# Patient Record
Sex: Male | Born: 1950 | Race: Black or African American | Hispanic: No | Marital: Married | Smoking: Never smoker
Health system: Southern US, Community
[De-identification: ages and names within clinical notes are randomized; demographics above are authoritative.]

## PROBLEM LIST (undated history)

## (undated) DIAGNOSIS — M1990 Unspecified osteoarthritis, unspecified site: Secondary | ICD-10-CM

## (undated) DIAGNOSIS — I10 Essential (primary) hypertension: Secondary | ICD-10-CM

## (undated) DIAGNOSIS — Z789 Other specified health status: Secondary | ICD-10-CM

## (undated) DIAGNOSIS — G4730 Sleep apnea, unspecified: Secondary | ICD-10-CM

## (undated) HISTORY — PX: HERNIA REPAIR: SHX51

## (undated) HISTORY — PX: ROTATOR CUFF REPAIR: SHX139

## (undated) HISTORY — PX: KNEE ARTHROSCOPY W/ ACL RECONSTRUCTION: SHX1858

## (undated) HISTORY — PX: OTHER SURGICAL HISTORY: SHX169

---

## 2003-11-18 ENCOUNTER — Emergency Department (HOSPITAL_COMMUNITY): Admission: EM | Admit: 2003-11-18 | Discharge: 2003-11-18 | Payer: Self-pay | Admitting: Family Medicine

## 2004-11-24 ENCOUNTER — Emergency Department (HOSPITAL_COMMUNITY): Admission: EM | Admit: 2004-11-24 | Discharge: 2004-11-24 | Payer: Self-pay | Admitting: Emergency Medicine

## 2004-12-01 ENCOUNTER — Emergency Department (HOSPITAL_COMMUNITY): Admission: EM | Admit: 2004-12-01 | Discharge: 2004-12-01 | Payer: Self-pay | Admitting: Family Medicine

## 2005-04-01 ENCOUNTER — Emergency Department (HOSPITAL_COMMUNITY): Admission: EM | Admit: 2005-04-01 | Discharge: 2005-04-01 | Payer: Self-pay | Admitting: Family Medicine

## 2005-04-01 ENCOUNTER — Ambulatory Visit (HOSPITAL_COMMUNITY): Admission: RE | Admit: 2005-04-01 | Discharge: 2005-04-01 | Payer: Self-pay | Admitting: Family Medicine

## 2005-07-16 ENCOUNTER — Ambulatory Visit (HOSPITAL_COMMUNITY): Admission: RE | Admit: 2005-07-16 | Discharge: 2005-07-17 | Payer: Self-pay | Admitting: Orthopedic Surgery

## 2006-08-26 ENCOUNTER — Emergency Department (HOSPITAL_COMMUNITY): Admission: EM | Admit: 2006-08-26 | Discharge: 2006-08-26 | Payer: Self-pay | Admitting: Family Medicine

## 2008-04-19 ENCOUNTER — Emergency Department (HOSPITAL_COMMUNITY): Admission: EM | Admit: 2008-04-19 | Discharge: 2008-04-19 | Payer: Self-pay | Admitting: Emergency Medicine

## 2008-08-29 ENCOUNTER — Emergency Department (HOSPITAL_COMMUNITY): Admission: EM | Admit: 2008-08-29 | Discharge: 2008-08-29 | Payer: Self-pay | Admitting: Emergency Medicine

## 2008-12-16 ENCOUNTER — Emergency Department (HOSPITAL_COMMUNITY): Admission: EM | Admit: 2008-12-16 | Discharge: 2008-12-16 | Payer: Self-pay | Admitting: Family Medicine

## 2010-04-20 ENCOUNTER — Emergency Department (HOSPITAL_COMMUNITY): Admission: EM | Admit: 2010-04-20 | Discharge: 2010-04-20 | Payer: Self-pay | Admitting: Emergency Medicine

## 2010-09-04 LAB — URINALYSIS, ROUTINE W REFLEX MICROSCOPIC
Bilirubin Urine: NEGATIVE
Glucose, UA: NEGATIVE mg/dL
Hgb urine dipstick: NEGATIVE
Ketones, ur: NEGATIVE mg/dL
Nitrite: NEGATIVE
Protein, ur: NEGATIVE mg/dL
Specific Gravity, Urine: 1.023 (ref 1.005–1.030)
Urobilinogen, UA: 1 mg/dL (ref 0.0–1.0)
pH: 6 (ref 5.0–8.0)

## 2010-09-04 LAB — DIFFERENTIAL
Basophils Absolute: 0.1 10*3/uL (ref 0.0–0.1)
Basophils Relative: 1 % (ref 0–1)
Eosinophils Absolute: 0.1 10*3/uL (ref 0.0–0.7)
Eosinophils Relative: 2 % (ref 0–5)
Lymphocytes Relative: 18 % (ref 12–46)
Lymphs Abs: 1 10*3/uL (ref 0.7–4.0)
Monocytes Absolute: 0.4 10*3/uL (ref 0.1–1.0)
Monocytes Relative: 7 % (ref 3–12)
Neutro Abs: 3.9 10*3/uL (ref 1.7–7.7)
Neutrophils Relative %: 72 % (ref 43–77)

## 2010-09-04 LAB — CBC
HCT: 38.9 % — ABNORMAL LOW (ref 39.0–52.0)
Hemoglobin: 13.6 g/dL (ref 13.0–17.0)
MCHC: 34.9 g/dL (ref 30.0–36.0)
MCV: 91.7 fL (ref 78.0–100.0)
Platelets: 196 10*3/uL (ref 150–400)
RBC: 4.24 MIL/uL (ref 4.22–5.81)
RDW: 12 % (ref 11.5–15.5)
WBC: 5.4 10*3/uL (ref 4.0–10.5)

## 2010-09-04 LAB — POCT I-STAT, CHEM 8
BUN: 11 mg/dL (ref 6–23)
Calcium, Ion: 1.05 mmol/L — ABNORMAL LOW (ref 1.12–1.32)
Chloride: 107 mEq/L (ref 96–112)
Creatinine, Ser: 1.3 mg/dL (ref 0.4–1.5)
Glucose, Bld: 87 mg/dL (ref 70–99)
HCT: 40 % (ref 39.0–52.0)
Hemoglobin: 13.6 g/dL (ref 13.0–17.0)
Potassium: 3.6 mEq/L (ref 3.5–5.1)
Sodium: 140 mEq/L (ref 135–145)
TCO2: 23 mmol/L (ref 0–100)

## 2010-10-08 NOTE — Op Note (Signed)
NAME:  Chad Guerra, Chad Guerra NO.:  [id number]   MEDICAL RECORD NO.:  [id number]          PATIENT TYPE:  EMS   LOCATION:  URG                          FACILITY:  [facility]   PHYSICIAN:  Dionne Ano. Gramig, M.D.DATE OF BIRTH:  [birth year]   DATE OF PROCEDURE:  DATE OF DISCHARGE:  12/16/2008                               OPERATIVE REPORT   I had the pleasure of seeing Chad Guerra today from Dr. Artis Flock at  the [facility].  The patient sustained  injury to the right index finger, crushing in nature, resulting in an  open phalanx fracture with associated nail plate and nail bed injury.  He presents with an open fracture, pain and is here with his wife.   PAST MEDICAL HISTORY:  Reviewed.   PAST SURGICAL HISTORY:  Shoulder and knee surgery.   CURRENT MEDICINE:  Aspirin p.r.n.   ALLERGIES:  The patient states that he has had difficulty with taking  PERCOCET in the past.   SOCIAL HISTORY:  He does not smoke.  He lives with his wife and many  children.   PHYSICAL EXAMINATION:  GENERAL:  He is alert and oriented.  VITAL SIGNS:  Stable.  EXTREMITIES:  The patient has open fracture about the index finger,  right hand.  PIP and MCP joints are stable, there is no evidence of  infection or dystrophy.  There is no evidence of vascular compromise.  CHEST:  He has a normal chest exam.  ABDOMEN:  Nontender.  HEART:  Regular rate.  HEENT:  __________.   At that time, x-rays show displaced fracture about the distal phalanx.  I have reviewed this at length and his findings.   IMPRESSION:  Open fracture distal phalanx right index finger.   PLAN:  He was consented and underwent intermetacarpal block and then was  treated with I&D of skin, subcutaneous tissue and bone with was an  excisional debridement of bone pulp tissue, nail bed and nail plate.   I then performed nail plate removal..  Following this, I then set the  fracture and performed open  treatment of the fracture.   Following this, I performed a nail bed repair with 5-0 chromic suture  under 4.0 loupe magnification.  I then placed Adaptic under the  eponychial fold and discussed with the patient the postop protocol.   He was dressed sterilely with Adaptic followed by Xeroform, sterile  gauze wrap and all precautions were discussed.   Following this, I then performed a thorough discussion including  elevation, __________ 7 days and therapy, 12 days myself.  Keflex was  written for postop antibiotic measures one p.o. q.i.d. 500 mg tablet.  The pain medicine prescribed was Vicodin 1-2 q.4-6 hours p.r.n. pain  p.o.  I discussed with the patient the relevant do's and don'ts, etc.   FINAL PROCEDURAL DIAGNOSES PERFORMED:  1. Status post open reduction internal fixation, distal phalanx      fracture.  2. Irrigation and dbridement, open distal phalanx fracture right      index finger, excisional debridement in nature.  3. Nail plate removal.  4. Nailbed repair.      Dionne Ano. Amanda Pea, M.D.  Electronically Signed     WMG/MEDQ  D:  12/16/2008  T:  12/17/2008  Job:  [id number]

## 2010-10-11 NOTE — Op Note (Signed)
NAME:  PERCIVAL, GLASHEEN NO.:  [id number]   MEDICAL RECORD NO.:  [id number]          PATIENT TYPE:  OIB   LOCATION:  1008                         FACILITY:  [facility]   PHYSICIAN:  Georges Lynch. Gioffre, M.D.DATE OF BIRTH:  Jul 05, 1950   DATE OF PROCEDURE:  07/16/2005  DATE OF DISCHARGE:  07/17/2005                                 OPERATIVE REPORT   SURGEON:  Windy Fast A. Darrelyn Hillock, M.D.   ASSISTANT:  Jamelle Rushing, P.A.   PREOP DIAGNOSIS:  1.  Complete tear of the rotator cuff tendon, right shoulder secondary to      severe impingement syndrome.  2.  Previous resection of his distal clavicle on the right side.   POSTOP DIAGNOSIS:  1.  Complete tear of the rotator cuff tendon, right shoulder secondary to      severe impingement syndrome.  2.  Previous resection of his distal clavicle.   OPERATIONS:  1.  Open decompression of the right shoulder by performing any      acromionectomy and acromioplasty.  2.  Repair of a complete tear of the rotator cuff tendon, right shoulder,      with reinforcement of a RESTORE tendon graft with three 4-pronged Mitek      sutures.   PROCEDURE:  Prior to the general anesthesia, the patient had an interscalene  nerve block. He was taken back to surgery and under general anesthesia, he  had a routine prep and draping of the right upper extremity, the right  shoulder. He had 1 gram of IV Ancef preop. An incision was made over the  anterior aspect of the right shoulder. Bleeders were identified and  cauterized. I then went down and detached the deltoid tendon in a  longitudinal fashion from the acromion and reflected the tendon laterally  and medially; and then split the proximal part of the deltoid muscle. I then  went down and identified the bursa; excised the subdeltoid bursa which was  chronically inflamed. Immediately upon noting this, he had a large hole in  his rotator cuff tendon. We noted that when we brought the shoulder up into  abduction the bone end of the acromion, literally, was imbedding into the  hole that was there. So we protected the remaining part of the cuff with a  Bennett retractor. We utilized the oscillating saw and the bur to perform a  partial acromionectomy and an acromioplasty. Once this was done and we had  good clearance; we irrigated the area out and then bone waxed the  undersurface of the acromion for hemostasis purposes.   We then addressed the rotator cuff, we incised the necrotic part of the  cuff. We then utilized the bur to bur down the lateral articular surface of  the humerus and then 2 Mitek sutures were inserted; the tendon was brought  forward and the tendon was sutured to the bone. Following that, we then  utilized a RESTORE tendon graft to graft the remaining part of the tendon.  The defect, now, was nicely closed. Following that, we then thoroughly  irrigated out the area,  inserted some thrombin-soaked Gelfoam;  and then reapproximated the deltoid  tendon to the muscle in the usual fashion. The subcu was closed with #0  Vicryl. The skin was closed with metal staples; and a sterile Neosporin  dressing was applied; and he was placed in a shoulder immobilizer.           ______________________________  Georges Lynch Darrelyn Hillock, M.D.     RAG/MEDQ  D:  07/16/2005  T:  07/17/2005  Job:  [id number]

## 2010-10-11 NOTE — H&P (Signed)
NAME:  TACOMA, MERIDA NO.:  [id number]   MEDICAL RECORD NO.:  [id number]          PATIENT TYPE:  OUT   LOCATION:  CATS                         FACILITY:  [facility]   PHYSICIAN:  Adolph Pollack, M.D.DATE OF BIRTH:  Dec 17, 1950   DATE OF ADMISSION:  04/01/2005  DATE OF DISCHARGE:                                HISTORY & PHYSICAL   CHIEF COMPLAINT:  Worsening left lower quadrant pain.   HISTORY OF PRESENT ILLNESS:  This is a 60 year old male who came in with  some crampy lower abdominal pain late last week. He had abruptly got worse  today and is localized to the left lower quadrant. He had some nausea and  chills with it. He had been eating a lot of sunflower seeds recently. Has  not had anything like this pain. He noticed a little bit of bloody drainage  out of his penis late last week as well. He has had some loose bowel  movements recently. He was seen up in Urgent Care and sent for a CT scan in  which acute diverticulitis was noted in the sigmoid region and a few  punctate bubbles of air were noted in the area, but no abscess or excessive  amount of free air. He subsequently was sent down to the emergency  department and I was then asked to see him. The only laboratory that was  done at Urgent Care was a urinalysis.   PAST MEDICAL HISTORY:  Dislocation of right shoulder.   PREVIOUS OPERATIONS:  Right shoulder surgery.   ALLERGIES:  PERCOCET.   MEDICATIONS:  Vicodin and a muscle relaxant.   SOCIAL HISTORY:  He is married and has 3 children. Denies tobacco or alcohol  use. He has recently located here from [place] and does not have a primary  care physician.   REVIEW OF SYSTEMS:  CARDIOVASCULAR:  No heart disease, no hypertension.  PULMONARY:  No pneumonia, asthma, tuberculosis. GI: No peptic ulcer disease  or colitis. GU: No hematuria. Does state he has kidney stones about 18 years  ago. ENDOCRINE:  No diabetes. NEUROLOGIC:  No strokes or seizures.  HEMATOLOGIC:  No bleeding disorders or transfusions. MUSCULOSKELETAL:  He  has a recent right shoulder injury and is being treated by Dr. Ranee Gosselin of [facility]. This is why he is taking the Vicodin and  muscle relaxant. He is also undergoing physical therapy for this.   PHYSICAL EXAMINATION:  GENERAL:  A well-developed, well-nourished male in no  acute distress. He is lying in the hallway in the emergency department.  VITAL SIGNS:  Temperature on arrival to the ER was 99.2, blood pressure  134/82, pulse 93.  HEENT:  Extraocular movements are intact, no icterus.  NECK:  Supple without masses, thyroid not enlarged.  RESPIRATORY:  Breath sounds are equal and clear, breathing is unlabored.  CARDIOVASCULAR:  Demonstrates a regular rate and rhythm, no murmur heard.  ABDOMEN:  Soft. There is an umbilical hernia present. There is left lower  quadrant tenderness, no guarding to palpation and percussion. No diffuse  peritoneal signs present.  EXTREMITIES:  Good muscle tone  for range of motion. He is a little bit  limited around his right shoulder area.   Urinalysis is negative. CBC pending. CT demonstrates inflammatory changes  around the sigmoid colon, small air bubbles adjacent to the colon. There is  no abscess.   IMPRESSION:  Acute sigmoid diverticulitis without abscess.   I had a long discussion with him about ideal treatment including basically  being admitted to the hospital with intravenous antibiotics and a liquid  diet. He however is hesitant to come into the hospital and would like to be  sent home.   PLAN:  I will give him a dose of IV Zosyn here in the emergency department.  Since he does not want to be admitted, I will go ahead and discharge him on  high dose Cipro with Flagyl for 2 weeks. I explained to him the danger of  potentially not being admitted, and pre-perforation and being very well. I  told him if he is not better in 48 hours or gets worse he  needs to come  straight to the emergency department. He needs to be on a liquid diet for 4  days. He needs to be on very limited activity. If he does get better in 48  hours or continues to improve, I will see him in the office in 2 weeks.      Adolph Pollack, M.D.  Electronically Signed     TJR/MEDQ  D:  04/01/2005  T:  04/01/2005  Job:  [id number]

## 2011-07-22 ENCOUNTER — Encounter (HOSPITAL_COMMUNITY): Payer: Self-pay | Admitting: Emergency Medicine

## 2011-07-22 ENCOUNTER — Emergency Department (HOSPITAL_COMMUNITY)
Admission: EM | Admit: 2011-07-22 | Discharge: 2011-07-22 | Disposition: A | Discharge status: Home / Self Care | Payer: BC Managed Care – PPO | Attending: Emergency Medicine | Admitting: Emergency Medicine

## 2011-07-22 ENCOUNTER — Emergency Department (HOSPITAL_COMMUNITY): Payer: BC Managed Care – PPO

## 2011-07-22 DIAGNOSIS — X500XXA Overexertion from strenuous movement or load, initial encounter: Secondary | ICD-10-CM | POA: Insufficient documentation

## 2011-07-22 DIAGNOSIS — S46909A Unspecified injury of unspecified muscle, fascia and tendon at shoulder and upper arm level, unspecified arm, initial encounter: Secondary | ICD-10-CM | POA: Insufficient documentation

## 2011-07-22 DIAGNOSIS — M25519 Pain in unspecified shoulder: Secondary | ICD-10-CM | POA: Insufficient documentation

## 2011-07-22 DIAGNOSIS — S4990XA Unspecified injury of shoulder and upper arm, unspecified arm, initial encounter: Secondary | ICD-10-CM

## 2011-07-22 DIAGNOSIS — S4980XA Other specified injuries of shoulder and upper arm, unspecified arm, initial encounter: Secondary | ICD-10-CM | POA: Insufficient documentation

## 2011-07-22 DIAGNOSIS — R209 Unspecified disturbances of skin sensation: Secondary | ICD-10-CM | POA: Insufficient documentation

## 2011-07-22 DIAGNOSIS — M25529 Pain in unspecified elbow: Secondary | ICD-10-CM | POA: Insufficient documentation

## 2011-07-22 MED ORDER — IBUPROFEN 800 MG PO TABS
800.0000 mg | ORAL_TABLET | Freq: Once | ORAL | Status: AC
  Administered 2011-07-22: 800 mg via ORAL
  Filled 2011-07-22: qty 1

## 2011-07-22 MED ORDER — IBUPROFEN 800 MG PO TABS: 800.0000 mg | ORAL_TABLET | Freq: Three times a day (TID) | ORAL | Status: AC | PRN

## 2011-07-22 MED ORDER — OXYCODONE-ACETAMINOPHEN 5-325 MG PO TABS
1.0000 | ORAL_TABLET | Freq: Once | ORAL | Status: DC
  Filled 2011-07-22: qty 1

## 2011-07-22 NOTE — Discharge Instructions (Signed)
Be sure to read and understand instructions below prior to leaving the hospital. If your symptoms persist without any improvement in 1 week it is reccommended that you follow up with orthopedics listed above. Use your pain medication as prescribed and do not operate heavy machinery while on pain medication. ° °Common mechanisms of injury include:   °Direct hit (trauma) to the shoulder.  °Aging, erosion of the tendon with normal use.  °Bony bump on shoulder (acromial spur).  °Stress from sudden increase in duration, frequency, or intensity of training ° °RISK INCREASES WITH:  °Contact sports (football, wrestling, boxing).  °Throwing sports (baseball, tennis, volleyball).  °Weightlifting and bodybuilding.  °Heavy labor.  °Previous injury to the rotator cuff, including impingement.  °Poor shoulder strength and flexibility.  °Failure to warm up properly before activity.  °Inadequate protective equipment.  °Old age.  °Bony bump on shoulder (acromial spur).  ° °RELATED COMPLICATIONS  °Shoulder stiffness, frozen shoulder, or loss of motion.  °Rotator cuff tendon tear.  °Recurring symptoms, especially if activity is resumed too soon, with overuse, with a direct blow, or when using poor technique.  ° °TREATMENT  °Treatment first involves the use of ice and medicine, to reduce pain and inflammation. The use of strengthening and stretching exercises may help reduce pain with activity. These exercises may be performed at home or with a therapist.  °If non-surgical treatment is unsuccessful after more than 6 months, surgery may be advised. °MEDICATION  °If pain medicine is needed, nonsteroidal anti-inflammatory medicines (Motrin and ibuprofen), or other minor pain relievers (acetaminophen) °Prescription pain relievers may be given, if your caregiver thinks they are needed. Use only as directed and only as much as you need  °ACTIVITY °      - It is reccommended to perform range of motion activity as tolerated to prevent shoulder  stiffness.  °HEAT AND COLD  °Cold treatment (icing) should be applied for 10 to 15 minutes every 2 to 3 hours for inflammation and pain, and immediately after activity that aggravates your symptoms. Use ice packs or an ice massage.  °Heat treatment may be used before performing stretching and strengthening activities prescribed by your caregiver, physical therapist, or athletic trainer. Use a heat pack or a warm water soak.  °SEEK IMMEDIATE MEDICAL CARE IF:  °Your arm, hand, or fingers are numb or tingling.  °Your arm, hand, or fingers are swollen, painful, or turn white or blue.  °You develop chest pain or shortness of breath.  °

## 2011-07-22 NOTE — ED Provider Notes (Signed)
Medical screening examination/treatment/procedure(s) were performed by non-physician practitioner and as supervising physician I was immediately available for consultation/collaboration.   Joya Gaskins, MD 07/22/11 [phone]

## 2011-07-22 NOTE — ED Notes (Signed)
Pt alert, nad, c/o right shoulder pain, onset a few days ago, presents to ED wearing sling from home, PMS intact, resp even unlabored, skin pwd

## 2011-07-22 NOTE — ED Provider Notes (Signed)
History     CSN: [id number]  Arrival date & time 07/22/11  2595   First MD Initiated Contact with Patient 07/22/11 2126      Chief Complaint  Patient presents with  . Shoulder Pain    (Consider location/radiation/quality/duration/timing/severity/associated sxs/prior treatment) HPI Comments: The patient emergency department with a chief complaint right shoulder pain.  Patient states that he was working 4 days ago when he injured his shoulder.  Patient describes the mechanism of injury as pushing a heavy linen cart upper ramp that no longer has railings.  The cart fell off and when the patient tried to stop it from hitting his legs his arm got caught and pulled.  Patient has had right shoulder pain and elbow pain since the incident.  He was evaluated by primary care given tramadol and a proximal and.  Patient states that his pain is excruciating and is concerned that he may have a dislocated shoulder.  Patient has had a dislocated shoulder before and has had right shoulder surgery in the past as well.  Patient did not have a current orthopedic to followup with.  Patient states that there is numbness and tingling sensation from his shoulder to his forearm but this has not passed his elbow to his hand. Pt denies weakness    Patient is a 61 y.o. male presenting with shoulder pain. The history is provided by the patient.  Shoulder Pain This is a new problem. The current episode started in the past 7 days (4 days ago on friday ). The problem occurs constantly. The problem has been unchanged. Associated symptoms include numbness. Pertinent negatives include no abdominal pain, chest pain, chills, congestion, fever, headaches, neck pain or weakness. Associated symptoms comments: Tingling numbness to elbow . Treatments tried: given tramadol and naproxen from [facility]     History reviewed. No pertinent past medical history.  History reviewed. No pertinent past surgical history.  No family history  on file.  History  Substance Use Topics  . Smoking status: Never Smoker   . Smokeless tobacco: Not on file  . Alcohol Use: No      Review of Systems  Constitutional: Negative for fever, chills and appetite change.  HENT: Negative for congestion and neck pain.   Eyes: Negative for visual disturbance.  Respiratory: Negative for shortness of breath.   Cardiovascular: Negative for chest pain and leg swelling.  Gastrointestinal: Negative for abdominal pain.  Genitourinary: Negative for dysuria, urgency and frequency.  Musculoskeletal: Negative for back pain and gait problem.  Neurological: Positive for numbness. Negative for dizziness, syncope, weakness, light-headedness and headaches.  Psychiatric/Behavioral: Negative for confusion.    Allergies  Percocet  Home Medications  No current outpatient prescriptions on file.  BP 138/96  Pulse 64  Temp(Src) 98.7 F (37.1 C) (Oral)  Resp 20  Wt 225 lb (102.059 kg)  SpO2 99%  Physical Exam  Nursing note and vitals reviewed. Constitutional: He is oriented to person, place, and time. He appears well-developed and well-nourished. No distress.  HENT:  Head: Normocephalic and atraumatic.  Eyes: Conjunctivae and EOM are normal.  Neck: Normal range of motion.  Pulmonary/Chest: Effort normal.  Musculoskeletal: Normal range of motion.       Arms:      Full active and passive range of motion of the right elbow.  Tenderness over olecranon process  Decreased passive abduction, flexion, and extension of right shoulder.  Strength 5/5 bilaterally, sensation intact.  No cervical bony tenderness.  Neurological: He is  alert and oriented to person, place, and time.  Skin: Skin is warm and dry. No rash noted. He is not diaphoretic.  Psychiatric: He has a normal mood and affect. His behavior is normal.    ED Course  Procedures (including critical care time)  Labs Reviewed - No data to display Dg Shoulder Right  07/22/2011  *RADIOLOGY REPORT*   Clinical Data: Fall and right shoulder pain.  History of rotator cuff tear.  RIGHT SHOULDER - 2+ VIEW  Comparison: None.  Findings: Three views of the right shoulder demonstrate postoperative changes in the right shoulder. There are surgical anchors in the proximal humerus.  There has been resection of the lateral right clavicle.  The right shoulder is located without acute fracture.  IMPRESSION: Postsurgical changes in the right shoulder.  No acute bony abnormality.  Original Report Authenticated By: Richarda Overlie, M.D.   Dg Elbow Complete Right  07/22/2011  *RADIOLOGY REPORT*  Clinical Data: Fall and right elbow pain.  RIGHT ELBOW - COMPLETE 3+ VIEW  Comparison: None.  Findings: Four views of the right elbow were obtained.  No evidence for an elbow joint effusion.  The elbow is located.  There is mild cortical irregularity along the lateral distal humerus but no clear evidence for a fracture.  IMPRESSION: No acute bony abnormality in the right elbow.  Original Report Authenticated By: Richarda Overlie, M.D.     No diagnosis found.    MDM  Shoulder, elbow pain  Patient X-Ray negative for obvious fracture or dislocation. Pain managed in ED. Pt advised to follow up with orthopedics if symptoms persist for possibility of missed fracture diagnosis. Conservative therapy recommended and discussed. Patient will be dc home & is agreeable with above plan.          Jaci Carrel, PA-C 07/22/11 [address], PA-C 07/22/11 2236

## 2011-10-04 ENCOUNTER — Emergency Department (HOSPITAL_COMMUNITY)
Admission: EM | Admit: 2011-10-04 | Discharge: 2011-10-05 | Disposition: A | Discharge status: Home / Self Care | Payer: BC Managed Care – PPO | Attending: Emergency Medicine | Admitting: Emergency Medicine

## 2011-10-04 ENCOUNTER — Encounter (HOSPITAL_COMMUNITY): Payer: Self-pay | Admitting: Emergency Medicine

## 2011-10-04 DIAGNOSIS — R110 Nausea: Secondary | ICD-10-CM | POA: Insufficient documentation

## 2011-10-04 DIAGNOSIS — K5732 Diverticulitis of large intestine without perforation or abscess without bleeding: Secondary | ICD-10-CM | POA: Insufficient documentation

## 2011-10-04 DIAGNOSIS — R1032 Left lower quadrant pain: Secondary | ICD-10-CM | POA: Insufficient documentation

## 2011-10-04 DIAGNOSIS — R509 Fever, unspecified: Secondary | ICD-10-CM | POA: Insufficient documentation

## 2011-10-04 DIAGNOSIS — K5792 Diverticulitis of intestine, part unspecified, without perforation or abscess without bleeding: Secondary | ICD-10-CM

## 2011-10-04 LAB — URINALYSIS, ROUTINE W REFLEX MICROSCOPIC
Bilirubin Urine: NEGATIVE
Glucose, UA: NEGATIVE mg/dL
Ketones, ur: NEGATIVE mg/dL
Leukocytes, UA: NEGATIVE
Nitrite: NEGATIVE
Specific Gravity, Urine: 1.018 (ref 1.005–1.030)
Urobilinogen, UA: 0.2 mg/dL (ref 0.0–1.0)
pH: 7 (ref 5.0–8.0)

## 2011-10-04 NOTE — ED Notes (Addendum)
Pt c/o periumbilical pain, +n/v, chills. Pt also reports seeing red/pink spots in underwear, as not seen gross blood in urine.

## 2011-10-05 ENCOUNTER — Emergency Department (HOSPITAL_COMMUNITY): Payer: BC Managed Care – PPO

## 2011-10-05 LAB — DIFFERENTIAL
Basophils Absolute: 0 10*3/uL (ref 0.0–0.1)
Eosinophils Absolute: 0.1 10*3/uL (ref 0.0–0.7)
Eosinophils Relative: 1 % (ref 0–5)
Lymphocytes Relative: 15 % (ref 12–46)
Lymphs Abs: 1.1 10*3/uL (ref 0.7–4.0)
Monocytes Relative: 7 % (ref 3–12)
Neutro Abs: 5.7 10*3/uL (ref 1.7–7.7)
Neutrophils Relative %: 77 % (ref 43–77)

## 2011-10-05 LAB — CBC
HCT: 46.9 % (ref 39.0–52.0)
Hemoglobin: 16 g/dL (ref 13.0–17.0)
MCH: 30.2 pg (ref 26.0–34.0)
MCHC: 34.1 g/dL (ref 30.0–36.0)
MCV: 88.7 fL (ref 78.0–100.0)
Platelets: 181 10*3/uL (ref 150–400)
RBC: 5.29 MIL/uL (ref 4.22–5.81)
RDW: 12.3 % (ref 11.5–15.5)
WBC: 7.4 10*3/uL (ref 4.0–10.5)

## 2011-10-05 LAB — BASIC METABOLIC PANEL
BUN: 13 mg/dL (ref 6–23)
CO2: 24 mEq/L (ref 19–32)
Calcium: 9.3 mg/dL (ref 8.4–10.5)
Chloride: 98 mEq/L (ref 96–112)
Creatinine, Ser: 1.09 mg/dL (ref 0.50–1.35)
GFR calc Af Amer: 84 mL/min — ABNORMAL LOW (ref >90)
GFR calc non Af Amer: 72 mL/min — ABNORMAL LOW (ref >90)
Glucose, Bld: 108 mg/dL — ABNORMAL HIGH (ref 70–99)
Potassium: 3.5 mEq/L (ref 3.5–5.1)
Sodium: 136 mEq/L (ref 135–145)

## 2011-10-05 MED ORDER — ONDANSETRON HCL 4 MG/2ML IJ SOLN
4.0000 mg | Freq: Once | INTRAMUSCULAR | Status: AC
  Administered 2011-10-05: 4 mg via INTRAVENOUS
  Filled 2011-10-05: qty 2

## 2011-10-05 MED ORDER — IOHEXOL 300 MG/ML  SOLN
100.0000 mL | Freq: Once | INTRAMUSCULAR | Status: AC | PRN
  Administered 2011-10-05: 100 mL via INTRAVENOUS

## 2011-10-05 MED ORDER — HYDROCODONE-ACETAMINOPHEN 5-325 MG PO TABS: 1.0000 | ORAL_TABLET | Freq: Four times a day (QID) | ORAL | Status: AC | PRN

## 2011-10-05 MED ORDER — CIPROFLOXACIN HCL 500 MG PO TABS: 500.0000 mg | ORAL_TABLET | Freq: Two times a day (BID) | ORAL | Status: AC

## 2011-10-05 MED ORDER — FENTANYL CITRATE 0.05 MG/ML IJ SOLN
100.0000 ug | Freq: Once | INTRAMUSCULAR | Status: AC
  Administered 2011-10-05: 100 ug via INTRAVENOUS
  Filled 2011-10-05: qty 2

## 2011-10-05 MED ORDER — HYDROCODONE-ACETAMINOPHEN 5-325 MG PO TABS
1.0000 | ORAL_TABLET | Freq: Once | ORAL | Status: AC
  Administered 2011-10-05: 1 via ORAL
  Filled 2011-10-05: qty 1

## 2011-10-05 MED ORDER — METRONIDAZOLE IN NACL 5-0.79 MG/ML-% IV SOLN
500.0000 mg | Freq: Once | INTRAVENOUS | Status: AC
  Administered 2011-10-05: 500 mg via INTRAVENOUS
  Filled 2011-10-05: qty 100

## 2011-10-05 MED ORDER — CIPROFLOXACIN IN D5W 400 MG/200ML IV SOLN
400.0000 mg | Freq: Once | INTRAVENOUS | Status: AC
  Administered 2011-10-05: 400 mg via INTRAVENOUS
  Filled 2011-10-05: qty 200

## 2011-10-05 MED ORDER — SODIUM CHLORIDE 0.9 % IV SOLN
INTRAVENOUS | Status: DC
  Administered 2011-10-05: 01:00:00 via INTRAVENOUS

## 2011-10-05 MED ORDER — METRONIDAZOLE 500 MG PO TABS: 500.0000 mg | ORAL_TABLET | Freq: Three times a day (TID) | ORAL | Status: AC

## 2011-10-05 NOTE — ED Notes (Signed)
Patient transported to CT 

## 2011-10-05 NOTE — ED Notes (Signed)
Ct notified pt completed CM 

## 2011-10-05 NOTE — ED Notes (Signed)
MD at bedside. 

## 2011-10-05 NOTE — ED Provider Notes (Signed)
History     CSN: [id number]  Arrival date & time 10/04/11  2204   First MD Initiated Contact with Patient 10/05/11 0026      Chief Complaint  Patient presents with  . Abdominal Pain    (Consider location/radiation/quality/duration/timing/severity/associated sxs/prior treatment) HPI This is a 61 year old black male with a history of umbilical hernia repair last year. He is here with about a 12 hour history of left lower quadrant pain. The pain is worsened over that period and is now severe. The pain is of a cramping nature. He states it feels as if someone is twisting his insides. He has been nauseated but has not vomited. There's been no diarrhea. He has moved his bowels. He has had chills and his temperature was noted to be 99.3 in triage.  History reviewed. No pertinent past medical history.  Past Surgical History  Procedure Date  . Hernia repair   . Rotator cuff repair     No family history on file.  History  Substance Use Topics  . Smoking status: Never Smoker   . Smokeless tobacco: Not on file  . Alcohol Use: No      Review of Systems  All other systems reviewed and are negative.    Allergies  Percocet  Home Medications   Current Outpatient Rx  Name Route Sig Dispense Refill  . TRAMADOL HCL 50 MG PO TABS Oral Take 50 mg by mouth every 6 (six) hours as needed.      BP 134/89  Pulse 72  Temp(Src) 99.3 F (37.4 C) (Oral)  Resp 23  SpO2 98%  Physical Exam General: Well-developed, well-nourished male in no acute distress; appearance consistent with age of record; appears uncomfortable HENT: normocephalic, atraumatic Eyes: pupils equal round and reactive to light; extraocular muscles intact Neck: supple Heart: regular rate and rhythm Lungs: clear to auscultation bilaterally Abdomen: soft; nondistended; moderate left lower quadrant tenderness with less severe suprapubic and right lower quadrant tenderness; no masses or hepatosplenomegaly; bowel sounds  present Extremities: No deformity; full range of motion; surgical deformity to anterior left lower leg Neurologic: Awake, alert and oriented; motor function intact in all extremities and symmetric; no facial droop Skin: Warm and dry Psychiatric: Anxious    ED Course  Procedures (including critical care time)     MDM   Nursing notes and vitals signs, including pulse oximetry, reviewed.  Summary of this visit's results, reviewed by myself:  Labs:  Results for orders placed during the hospital encounter of 10/04/11  URINALYSIS, ROUTINE W REFLEX MICROSCOPIC      Component Value Range   Color, Urine YELLOW  YELLOW    APPearance CLOUDY (*) CLEAR    Specific Gravity, Urine 1.018  1.005 - 1.030    pH 7.0  5.0 - 8.0    Glucose, UA NEGATIVE  NEGATIVE (mg/dL)   Hgb urine dipstick NEGATIVE  NEGATIVE    Bilirubin Urine NEGATIVE  NEGATIVE    Ketones, ur NEGATIVE  NEGATIVE (mg/dL)   Protein, ur NEGATIVE  NEGATIVE (mg/dL)   Urobilinogen, UA 0.2  0.0 - 1.0 (mg/dL)   Nitrite NEGATIVE  NEGATIVE    Leukocytes, UA NEGATIVE  NEGATIVE   BASIC METABOLIC PANEL      Component Value Range   Sodium 136  135 - 145 (mEq/L)   Potassium 3.5  3.5 - 5.1 (mEq/L)   Chloride 98  96 - 112 (mEq/L)   CO2 24  19 - 32 (mEq/L)   Glucose, Bld 108 (*) 70 -  99 (mg/dL)   BUN 13  6 - 23 (mg/dL)   Creatinine, Ser 9.56  0.50 - 1.35 (mg/dL)   Calcium 9.3  8.4 - 21.3 (mg/dL)   GFR calc non Af Amer 72 (*) >90 (mL/min)   GFR calc Af Amer 84 (*) >90 (mL/min)  CBC      Component Value Range   WBC 7.4  4.0 - 10.5 (K/uL)   RBC 5.29  4.22 - 5.81 (MIL/uL)   Hemoglobin 16.0  13.0 - 17.0 (g/dL)   HCT 08.6  57.8 - 46.9 (%)   MCV 88.7  78.0 - 100.0 (fL)   MCH 30.2  26.0 - 34.0 (pg)   MCHC 34.1  30.0 - 36.0 (g/dL)   RDW 62.9  52.8 - 41.3 (%)   Platelets 181  150 - 400 (K/uL)  DIFFERENTIAL      Component Value Range   Neutrophils Relative 77  43 - 77 (%)   Lymphocytes Relative 15  12 - 46 (%)   Monocytes Relative 7  3  - 12 (%)   Eosinophils Relative 1  0 - 5 (%)   Basophils Relative 0  0 - 1 (%)   Neutro Abs 5.7  1.7 - 7.7 (K/uL)   Lymphs Abs 1.1  0.7 - 4.0 (K/uL)   Monocytes Absolute 0.5  0.1 - 1.0 (K/uL)   Eosinophils Absolute 0.1  0.0 - 0.7 (K/uL)   Basophils Absolute 0.0  0.0 - 0.1 (K/uL)   WBC Morphology VACUOLATED NEUTROPHILS     Smear Review LARGE PLATELETS PRESENT      Imaging Studies: Ct Abdomen Pelvis W Contrast  10/05/2011  *RADIOLOGY REPORT*  Clinical Data: Periumbilical pain.  Nausea and vomiting.  Chills.  CT ABDOMEN AND PELVIS WITH CONTRAST  Technique:  Multidetector CT imaging of the abdomen and pelvis was performed following the standard protocol during bolus administration of intravenous contrast.  Contrast: OMNIPAQUE IOHEXOL 300 MG/ML  SOLN  Comparison: 08/29/2008  Findings: Minimal dependent changes in the lung bases.  Small esophageal hiatal hernia.  Small stones in the dependent portion of the gallbladder.  No gallbladder wall thickening or bile duct dilatation.  The liver, spleen, pancreas, adrenal glands, kidneys, abdominal aorta, and retroperitoneal lymph nodes are unremarkable.  The stomach is not distended.  Small bowel are not dilated.  Stool filled colon without distension.  No free air or free fluid in the abdomen. There is scarring in the anterior abdominal wall consistent with repair of umbilical hernia.  Pelvis:  Calcification in the prostate gland.  Bladder wall is not thickened.  Diverticulosis of the sigmoid colon with inflammatory changes around the descending/sigmoid junction region and focal wall thickening consistent with diverticulitis.  Follow up after resolution of acute symptoms is recommended to exclude underlying mass lesion.  Changes are similar to the previous study.  No evidence of abscess.  No significant pelvic lymphadenopathy.  The appendix is normal.  IMPRESSION: Diverticulosis with inflammatory changes consistent with diverticulitis in the sigmoid colon.   Focal segmental wall thickening in the region of abnormality.  Follow up after resolution is recommended to exclude underlying mass lesion. Findings are similar to those seen on the previous study from 2010. Cholelithiasis.  Original Report Authenticated By: Marlon Pel, M.D.            Hanley Seamen, MD 10/05/11 1234

## 2011-10-05 NOTE — Discharge Instructions (Signed)
Diverticulitis A diverticulum is a small pouch or sac on the colon. Diverticulosis is the presence of these diverticula on the colon. Diverticulitis is the irritation (inflammation) or infection of diverticula. CAUSES  The colon and its diverticula contain bacteria. If food particles block the tiny opening to a diverticulum, the bacteria inside can grow and cause an increase in pressure. This leads to infection and inflammation and is called diverticulitis. SYMPTOMS   Abdominal pain and tenderness. Usually, the pain is located on the left side of your abdomen. However, it could be located elsewhere.   Fever.   Bloating.   Feeling sick to your stomach (nausea).   Throwing up (vomiting).   Abnormal stools.  DIAGNOSIS  Your caregiver will take a history and perform a physical exam. Since many things can cause abdominal pain, other tests may be necessary. Tests may include:  Blood tests.   Urine tests.   X-ray of the abdomen.   CT scan of the abdomen.  Sometimes, surgery is needed to determine if diverticulitis or other conditions are causing your symptoms. TREATMENT  Most of the time, you can be treated without surgery. Treatment includes:  Resting the bowels by only having liquids for a few days. As you improve, you will need to eat a low-fiber diet.   Intravenous (IV) fluids if you are losing body fluids (dehydrated).   Antibiotic medicines that treat infections may be given.   Pain and nausea medicine, if needed.   Surgery if the inflamed diverticulum has burst.  HOME CARE INSTRUCTIONS   Try a clear liquid diet (broth, tea, or water for as long as directed by your caregiver). You may then gradually begin a low-fiber diet as tolerated. A low-fiber diet is a diet with less than 10 grams of fiber. Choose the foods below to reduce fiber in the diet:   White breads, cereals, rice, and pasta.   Cooked fruits and vegetables or soft fresh fruits and vegetables without the skin.     Ground or well-cooked tender beef, ham, veal, lamb, pork, or poultry.   Eggs and seafood.   After your diverticulitis symptoms have improved, your caregiver may put you on a high-fiber diet. A high-fiber diet includes 14 grams of fiber for every 1000 calories consumed. For a standard 2000 calorie diet, you would need 28 grams of fiber. Follow these diet guidelines to help you increase the fiber in your diet. It is important to slowly increase the amount fiber in your diet to avoid gas, constipation, and bloating.   Choose whole-grain breads, cereals, pasta, and brown rice.   Choose fresh fruits and vegetables with the skin on. Do not overcook vegetables because the more vegetables are cooked, the more fiber is lost.   Choose more nuts, seeds, legumes, dried peas, beans, and lentils.   Look for food products that have greater than 3 grams of fiber per serving on the Nutrition Facts label.   Take all medicine as directed by your caregiver.   If your caregiver has given you a follow-up appointment, it is very important that you go. Not going could result in lasting (chronic) or permanent injury, pain, and disability. If there is any problem keeping the appointment, call to reschedule.  SEEK MEDICAL CARE IF:   Your pain does not improve.   You have a hard time advancing your diet beyond clear liquids.   Your bowel movements do not return to normal.  SEEK IMMEDIATE MEDICAL CARE IF:   Your pain becomes   worse.   You have an oral temperature above 102 F (38.9 C), not controlled by medicine.   You have repeated vomiting.   You have bloody or black, tarry stools.   Symptoms that brought you to your caregiver become worse or are not getting better.  MAKE SURE YOU:   Understand these instructions.   Will watch your condition.   Will get help right away if you are not doing well or get worse.  Document Released: 02/19/2005 Document Revised: 05/01/2011 Document Reviewed:  06/17/2010 [employer] Patient Information 2012 [employer]. 

## 2012-07-03 ENCOUNTER — Emergency Department (HOSPITAL_COMMUNITY): Payer: Self-pay

## 2012-07-03 ENCOUNTER — Encounter (HOSPITAL_COMMUNITY): Payer: Self-pay | Admitting: Emergency Medicine

## 2012-07-03 ENCOUNTER — Ambulatory Visit (HOSPITAL_COMMUNITY)
Admission: EM | Admit: 2012-07-03 | Discharge: 2012-07-05 | Disposition: A | Discharge status: Home / Self Care | Payer: Self-pay | Attending: Surgery | Admitting: Surgery

## 2012-07-03 DIAGNOSIS — Z9049 Acquired absence of other specified parts of digestive tract: Secondary | ICD-10-CM

## 2012-07-03 DIAGNOSIS — R109 Unspecified abdominal pain: Secondary | ICD-10-CM

## 2012-07-03 DIAGNOSIS — K8010 Calculus of gallbladder with chronic cholecystitis without obstruction: Secondary | ICD-10-CM | POA: Insufficient documentation

## 2012-07-03 DIAGNOSIS — R1110 Vomiting, unspecified: Secondary | ICD-10-CM

## 2012-07-03 DIAGNOSIS — K8000 Calculus of gallbladder with acute cholecystitis without obstruction: Principal | ICD-10-CM | POA: Insufficient documentation

## 2012-07-03 DIAGNOSIS — Z79899 Other long term (current) drug therapy: Secondary | ICD-10-CM | POA: Insufficient documentation

## 2012-07-03 LAB — COMPREHENSIVE METABOLIC PANEL
Alkaline Phosphatase: 98 U/L (ref 39–117)
BUN: 8 mg/dL (ref 6–23)
CO2: 26 mEq/L (ref 19–32)
Calcium: 8.5 mg/dL (ref 8.4–10.5)
GFR calc Af Amer: 82 mL/min — ABNORMAL LOW (ref >90)
GFR calc non Af Amer: 71 mL/min — ABNORMAL LOW (ref >90)
Glucose, Bld: 105 mg/dL — ABNORMAL HIGH (ref 70–99)
Total Protein: 7.3 g/dL (ref 6.0–8.3)

## 2012-07-03 LAB — CBC WITH DIFFERENTIAL/PLATELET
Eosinophils Absolute: 0.1 10*3/uL (ref 0.0–0.7)
Eosinophils Relative: 2 % (ref 0–5)
HCT: 43.4 % (ref 39.0–52.0)
Hemoglobin: 15.3 g/dL (ref 13.0–17.0)
Lymphs Abs: 1.2 10*3/uL (ref 0.7–4.0)
MCH: 31.5 pg (ref 26.0–34.0)
MCV: 89.3 fL (ref 78.0–100.0)
Monocytes Relative: 9 % (ref 3–12)
RBC: 4.86 MIL/uL (ref 4.22–5.81)

## 2012-07-03 LAB — URINALYSIS, ROUTINE W REFLEX MICROSCOPIC
Bilirubin Urine: NEGATIVE
Nitrite: NEGATIVE
Protein, ur: NEGATIVE mg/dL
Specific Gravity, Urine: 1.013 (ref 1.005–1.030)
Urobilinogen, UA: 0.2 mg/dL (ref 0.0–1.0)

## 2012-07-03 LAB — LIPASE, BLOOD: Lipase: 49 U/L (ref 11–59)

## 2012-07-03 MED ORDER — MORPHINE SULFATE 4 MG/ML IJ SOLN
4.0000 mg | Freq: Once | INTRAMUSCULAR | Status: AC
  Administered 2012-07-03: 4 mg via INTRAVENOUS
  Filled 2012-07-03: qty 1

## 2012-07-03 MED ORDER — METOCLOPRAMIDE HCL 5 MG/ML IJ SOLN
10.0000 mg | Freq: Once | INTRAMUSCULAR | Status: AC
  Administered 2012-07-03: 10 mg via INTRAVENOUS
  Filled 2012-07-03: qty 2

## 2012-07-03 MED ORDER — ONDANSETRON 8 MG PO TBDP
8.0000 mg | ORAL_TABLET | Freq: Once | ORAL | Status: AC
  Administered 2012-07-03: 8 mg via ORAL
  Filled 2012-07-03: qty 1

## 2012-07-03 MED ORDER — SODIUM CHLORIDE 0.9 % IV BOLUS (SEPSIS)
1000.0000 mL | Freq: Once | INTRAVENOUS | Status: AC
  Administered 2012-07-03: 1000 mL via INTRAVENOUS

## 2012-07-03 MED ORDER — GI COCKTAIL ~~LOC~~
30.0000 mL | Freq: Once | ORAL | Status: AC
  Administered 2012-07-03: 30 mL via ORAL
  Filled 2012-07-03: qty 30

## 2012-07-03 NOTE — ED Provider Notes (Signed)
History     CSN: [id number]  Arrival date & time 07/03/12  2014   First MD Initiated Contact with Patient 07/03/12 2109      Chief Complaint  Patient presents with  . Abdominal Pain    (Consider location/radiation/quality/duration/timing/severity/associated sxs/prior treatment) The history is provided by the patient.  Chad Guerra is a 62 y.o. male history of hernia repair here presenting with epigastric pain and vomiting. Started around 6 PM tonight when he had a mid abdominal pain radiating up to the epigastric area. Subsequently he has nauseous feeling and he vomited 7 times. He had a similar episode a week ago and felt better after vomiting 9 times. No fevers or chills no constipation diarrhea or urinary symptoms.   History reviewed. No pertinent past medical history.  Past Surgical History  Procedure Laterality Date  . Hernia repair    . Rotator cuff repair      No family history on file.  History  Substance Use Topics  . Smoking status: Never Smoker   . Smokeless tobacco: Not on file  . Alcohol Use: No      Review of Systems  Gastrointestinal: Positive for vomiting and abdominal pain.  All other systems reviewed and are negative.    Allergies  Percocet  Home Medications   Current Outpatient Rx  Name  Route  Sig  Dispense  Refill  . methocarbamol (ROBAXIN) 500 MG tablet   Oral   Take 500 mg by mouth 4 (four) times daily as needed (for muscle spasm or pain).         . traMADol (ULTRAM) 50 MG tablet   Oral   Take 50 mg by mouth every 6 (six) hours as needed (for pain).            BP 165/94  Pulse 70  Temp(Src) 97.8 F (36.6 C) (Oral)  Resp 18  Ht 5\' 10"  (1.778 m)  Wt 230 lb (104.327 kg)  BMI 33 kg/m2  SpO2 99%  Physical Exam  Nursing note and vitals reviewed. Constitutional: He is oriented to person, place, and time. He appears well-developed.  Uncomfortable. R arm sling in place for rotator cuff injury  HENT:  Head:  Normocephalic.  MM dry   Eyes: Conjunctivae are normal. Pupils are equal, round, and reactive to light.  Neck: Normal range of motion. Neck supple.  Cardiovascular: Normal rate, regular rhythm and normal heart sounds.   Pulmonary/Chest: Effort normal and breath sounds normal. No respiratory distress. He has no wheezes.  Abdominal: Soft. Bowel sounds are normal.  + epigastric tenderness, no rebound. + umbilical mesh appears intact with no tenderness.   Musculoskeletal: Normal range of motion.  Neurological: He is alert and oriented to person, place, and time.  Skin: Skin is warm and dry.  Psychiatric: He has a normal mood and affect. His behavior is normal. Judgment and thought content normal.    ED Course  Procedures (including critical care time)   EMERGENCY DEPARTMENT US GALLBLADDER INTERPRETATION "Study: Limited Ultrasound of the gallbladder and common bile duct."  INDICATIONS: RUQ pain Indication: Multiple views of the gallbladder and common bile duct are obtained with a  Multi-frequency probe."  PERFORMED BY:  Myself  IMAGES ARCHIVED?: Yes  FINDINGS: Gallstones present, Gallbladder wall thickened >47mm and Common bile duct normal in size  LIMITATIONS: Body Habitus  INTERPRETATION: Cholelithiasis  COMMENT:  Cholelithiasis, possible early cholecystitis.    Labs Reviewed  CBC WITH DIFFERENTIAL - Abnormal; Notable for the following:  WBC 3.5 (*)    All other components within normal limits  COMPREHENSIVE METABOLIC PANEL - Abnormal; Notable for the following:    Glucose, Bld 105 (*)    GFR calc non Af Amer 71 (*)    GFR calc Af Amer 82 (*)    All other components within normal limits  LIPASE, BLOOD  URINALYSIS, ROUTINE W REFLEX MICROSCOPIC   Dg Abd Acute W/chest  07/03/2012  *RADIOLOGY REPORT*  Clinical Data: Abdominal pain and nausea.  ACUTE ABDOMEN SERIES (ABDOMEN 2 VIEW & CHEST 1 VIEW)  Comparison: CT abdomen and pelvis 10/05/2011  Findings: The shallow  inspiration.  Linear infiltration or atelectasis in the left lung base.  Normal heart size and pulmonary vascularity.  No focal airspace disease or consolidation in the lungs.  No blunting of costophrenic angles.  No pneumothorax. Tortuous aorta.  Postoperative changes in the right shoulder.  Scattered gas and stool in the colon.  No small or large bowel distension.  No free intra-abdominal air.  No radiopaque stones. Mild degenerative changes in the lumbar spine.  IMPRESSION: Linear infiltration or atelectasis in the left lung base. Nonobstructive bowel gas pattern.   Original Report Authenticated By: Burman Nieves, M.D.      No diagnosis found.    MDM  Chad Guerra is a 62 y.o. male here with ab pain. Will need to r/o pancreatitis vs gastroenteritis vs SBO. Will get ab xray. Will get labs and give IVF and pain meds and antinausea meds and reassess.   11:15 PM Patient's ab xray is unremarkable. Labs unremarkable. Still having epigastric tenderness on exam. Bedside RUQ US showed gallbladder with many stones and wall is dilated. Will do RUQ [place] to r/o cholecystitis.  12:02 AM I signed out to Dr. Rulon Abide in the ED to f/u RUQ [place].       Richardean Canal, MD 07/04/12 Marlyne Beards

## 2012-07-03 NOTE — ED Notes (Signed)
Pt c/o sharp mid abd pain radiating to epigastric area onset 1800 tonight, emesis x 7.

## 2012-07-04 ENCOUNTER — Encounter (HOSPITAL_COMMUNITY): Admission: EM | Disposition: A | Discharge status: Home / Self Care | Payer: Self-pay | Source: Home / Self Care

## 2012-07-04 ENCOUNTER — Encounter (HOSPITAL_COMMUNITY): Payer: Self-pay | Admitting: Anesthesiology

## 2012-07-04 ENCOUNTER — Emergency Department (HOSPITAL_COMMUNITY): Payer: Self-pay

## 2012-07-04 ENCOUNTER — Encounter (HOSPITAL_COMMUNITY): Payer: Self-pay | Admitting: *Deleted

## 2012-07-04 ENCOUNTER — Emergency Department (HOSPITAL_COMMUNITY): Payer: Self-pay | Admitting: Anesthesiology

## 2012-07-04 DIAGNOSIS — Z9049 Acquired absence of other specified parts of digestive tract: Secondary | ICD-10-CM

## 2012-07-04 DIAGNOSIS — K8010 Calculus of gallbladder with chronic cholecystitis without obstruction: Secondary | ICD-10-CM

## 2012-07-04 HISTORY — PX: CHOLECYSTECTOMY: SHX55

## 2012-07-04 SURGERY — LAPAROSCOPIC CHOLECYSTECTOMY WITH INTRAOPERATIVE CHOLANGIOGRAM
Anesthesia: General | Site: Abdomen | Wound class: Contaminated

## 2012-07-04 MED ORDER — DEXTROSE 5 % IV SOLN: 1.0000 g | Freq: Four times a day (QID) | INTRAVENOUS | Status: DC

## 2012-07-04 MED ORDER — SUCCINYLCHOLINE CHLORIDE 20 MG/ML IJ SOLN
INTRAMUSCULAR | Status: DC | PRN
  Administered 2012-07-04: 140 mg via INTRAVENOUS

## 2012-07-04 MED ORDER — HYDROMORPHONE HCL PF 1 MG/ML IJ SOLN
INTRAMUSCULAR | Status: AC
  Filled 2012-07-04: qty 2

## 2012-07-04 MED ORDER — ONDANSETRON HCL 4 MG PO TABS: 4.0000 mg | ORAL_TABLET | Freq: Four times a day (QID) | ORAL | Status: DC | PRN

## 2012-07-04 MED ORDER — IOHEXOL 300 MG/ML  SOLN
INTRAMUSCULAR | Status: AC
  Filled 2012-07-04: qty 1

## 2012-07-04 MED ORDER — DEXTROSE IN LACTATED RINGERS 5 % IV SOLN
INTRAVENOUS | Status: DC
  Administered 2012-07-04: 04:00:00 via INTRAVENOUS

## 2012-07-04 MED ORDER — ACETAMINOPHEN 10 MG/ML IV SOLN
INTRAVENOUS | Status: AC
  Filled 2012-07-04: qty 100

## 2012-07-04 MED ORDER — HEPARIN SODIUM (PORCINE) 5000 UNIT/ML IJ SOLN
5000.0000 [IU] | Freq: Three times a day (TID) | INTRAMUSCULAR | Status: DC
  Administered 2012-07-04 – 2012-07-05 (×3): 5000 [IU] via SUBCUTANEOUS
  Filled 2012-07-04 (×5): qty 1

## 2012-07-04 MED ORDER — DEXTROSE 5 % IV SOLN
2.0000 g | Freq: Two times a day (BID) | INTRAVENOUS | Status: DC
  Administered 2012-07-04 (×2): 2 g via INTRAVENOUS
  Filled 2012-07-04 (×2): qty 2

## 2012-07-04 MED ORDER — DEXTROSE 5 % IV SOLN
2.0000 g | Freq: Once | INTRAVENOUS | Status: DC
  Filled 2012-07-04: qty 2

## 2012-07-04 MED ORDER — KETOROLAC TROMETHAMINE 15 MG/ML IJ SOLN
15.0000 mg | Freq: Four times a day (QID) | INTRAMUSCULAR | Status: DC
  Administered 2012-07-04 – 2012-07-05 (×4): 15 mg via INTRAVENOUS
  Filled 2012-07-04 (×8): qty 1

## 2012-07-04 MED ORDER — KCL IN DEXTROSE-NACL 20-5-0.45 MEQ/L-%-% IV SOLN
INTRAVENOUS | Status: AC
  Filled 2012-07-04: qty 1000

## 2012-07-04 MED ORDER — CEFOXITIN SODIUM-DEXTROSE 1-4 GM-% IV SOLR (PREMIX)
INTRAVENOUS | Status: AC
  Filled 2012-07-04: qty 100

## 2012-07-04 MED ORDER — NEOSTIGMINE METHYLSULFATE 1 MG/ML IJ SOLN
INTRAMUSCULAR | Status: DC | PRN
  Administered 2012-07-04: 4 mg via INTRAVENOUS

## 2012-07-04 MED ORDER — FENTANYL CITRATE 0.05 MG/ML IJ SOLN
INTRAMUSCULAR | Status: DC | PRN
  Administered 2012-07-04 (×4): 50 ug via INTRAVENOUS
  Administered 2012-07-04: 25 ug via INTRAVENOUS

## 2012-07-04 MED ORDER — SODIUM CHLORIDE 0.9 % IR SOLN
Status: DC | PRN
  Administered 2012-07-04: 1000 mL

## 2012-07-04 MED ORDER — KETOROLAC TROMETHAMINE 30 MG/ML IJ SOLN
15.0000 mg | Freq: Once | INTRAMUSCULAR | Status: AC
  Administered 2012-07-04: 15 mg via INTRAVENOUS
  Filled 2012-07-04: qty 1

## 2012-07-04 MED ORDER — HYDROMORPHONE HCL PF 1 MG/ML IJ SOLN
0.2500 mg | INTRAMUSCULAR | Status: DC | PRN
  Administered 2012-07-04 (×4): 0.5 mg via INTRAVENOUS

## 2012-07-04 MED ORDER — CISATRACURIUM BESYLATE (PF) 10 MG/5ML IV SOLN
INTRAVENOUS | Status: DC | PRN
  Administered 2012-07-04: 8 mg via INTRAVENOUS
  Administered 2012-07-04 (×3): 2 mg via INTRAVENOUS

## 2012-07-04 MED ORDER — KCL IN DEXTROSE-NACL 20-5-0.45 MEQ/L-%-% IV SOLN
INTRAVENOUS | Status: DC
  Administered 2012-07-04 – 2012-07-05 (×3): via INTRAVENOUS
  Filled 2012-07-04 (×3): qty 1000

## 2012-07-04 MED ORDER — PROPOFOL 10 MG/ML IV EMUL
INTRAVENOUS | Status: DC | PRN
  Administered 2012-07-04: 200 mg via INTRAVENOUS

## 2012-07-04 MED ORDER — BUPIVACAINE-EPINEPHRINE PF 0.25-1:200000 % IJ SOLN
INTRAMUSCULAR | Status: AC
  Filled 2012-07-04: qty 90

## 2012-07-04 MED ORDER — MORPHINE SULFATE 2 MG/ML IJ SOLN
INTRAMUSCULAR | Status: AC
  Filled 2012-07-04: qty 1

## 2012-07-04 MED ORDER — PROMETHAZINE HCL 25 MG/ML IJ SOLN: 6.2500 mg | INTRAMUSCULAR | Status: DC | PRN

## 2012-07-04 MED ORDER — EPHEDRINE SULFATE 50 MG/ML IJ SOLN
INTRAMUSCULAR | Status: DC | PRN
  Administered 2012-07-04 (×2): 10 mg via INTRAVENOUS

## 2012-07-04 MED ORDER — TRAMADOL HCL 50 MG PO TABS
50.0000 mg | ORAL_TABLET | Freq: Four times a day (QID) | ORAL | Status: DC | PRN
  Administered 2012-07-04 – 2012-07-05 (×2): 50 mg via ORAL
  Filled 2012-07-04 (×2): qty 1

## 2012-07-04 MED ORDER — LACTATED RINGERS IV SOLN
INTRAVENOUS | Status: DC | PRN
  Administered 2012-07-04 (×2): via INTRAVENOUS

## 2012-07-04 MED ORDER — GLYCOPYRROLATE 0.2 MG/ML IJ SOLN
INTRAMUSCULAR | Status: DC | PRN
  Administered 2012-07-04: .5 mg via INTRAVENOUS

## 2012-07-04 MED ORDER — MORPHINE SULFATE 2 MG/ML IJ SOLN
1.0000 mg | INTRAMUSCULAR | Status: DC | PRN
  Administered 2012-07-04 – 2012-07-05 (×5): 1 mg via INTRAVENOUS
  Filled 2012-07-04 (×4): qty 1

## 2012-07-04 MED ORDER — MIDAZOLAM HCL 5 MG/5ML IJ SOLN
INTRAMUSCULAR | Status: DC | PRN
  Administered 2012-07-04: 1.5 mg via INTRAVENOUS

## 2012-07-04 MED ORDER — HEPARIN SODIUM (PORCINE) 5000 UNIT/ML IJ SOLN: 5000.0000 [IU] | Freq: Three times a day (TID) | INTRAMUSCULAR | Status: DC

## 2012-07-04 MED ORDER — IOHEXOL 300 MG/ML  SOLN
INTRAMUSCULAR | Status: DC | PRN
  Administered 2012-07-04: 23 mL via INTRAVENOUS

## 2012-07-04 MED ORDER — LIDOCAINE HCL (CARDIAC) 20 MG/ML IV SOLN
INTRAVENOUS | Status: DC | PRN
  Administered 2012-07-04: 20 mg via INTRAVENOUS

## 2012-07-04 MED ORDER — ONDANSETRON HCL 4 MG/2ML IJ SOLN: 4.0000 mg | Freq: Four times a day (QID) | INTRAMUSCULAR | Status: DC | PRN

## 2012-07-04 MED ORDER — LACTATED RINGERS IR SOLN
Status: DC | PRN
  Administered 2012-07-04: 1

## 2012-07-04 MED ORDER — BUPIVACAINE-EPINEPHRINE 0.25% -1:200000 IJ SOLN
INTRAMUSCULAR | Status: DC | PRN
  Administered 2012-07-04: 20 mL

## 2012-07-04 MED ORDER — ONDANSETRON HCL 4 MG/2ML IJ SOLN
INTRAMUSCULAR | Status: DC | PRN
  Administered 2012-07-04 (×2): 2 mg via INTRAVENOUS

## 2012-07-04 SURGICAL SUPPLY — 43 items
APPLIER CLIP 5 13 M/L LIGAMAX5 (MISCELLANEOUS) ×2
APPLIER CLIP ROT 10 11.4 M/L (STAPLE) ×2
BENZOIN TINCTURE PRP APPL 2/3 (GAUZE/BANDAGES/DRESSINGS) ×2 IMPLANT
CABLE HIGH FREQUENCY MONO STRZ (ELECTRODE) ×2 IMPLANT
CANISTER SUCTION 2500CC (MISCELLANEOUS) ×2 IMPLANT
CATH REDDICK CHOLANGI 4FR 50CM (CATHETERS) ×2 IMPLANT
CLIP APPLIE 5 13 M/L LIGAMAX5 (MISCELLANEOUS) ×1 IMPLANT
CLIP APPLIE ROT 10 11.4 M/L (STAPLE) ×1 IMPLANT
CLOTH BEACON ORANGE TIMEOUT ST (SAFETY) ×2 IMPLANT
CLSR STERI-STRIP ANTIMIC 1/2X4 (GAUZE/BANDAGES/DRESSINGS) ×2 IMPLANT
COVER MAYO STAND STRL (DRAPES) ×2 IMPLANT
COVER SURGICAL LIGHT HANDLE (MISCELLANEOUS) ×2 IMPLANT
DECANTER SPIKE VIAL GLASS SM (MISCELLANEOUS) ×2 IMPLANT
DRAPE C-ARM 42X72 X-RAY (DRAPES) ×2 IMPLANT
DRAPE LAPAROSCOPIC ABDOMINAL (DRAPES) ×2 IMPLANT
ELECT REM PT RETURN 9FT ADLT (ELECTROSURGICAL) ×2
ELECTRODE REM PT RTRN 9FT ADLT (ELECTROSURGICAL) ×1 IMPLANT
GAUZE SPONGE 2X2 8PLY STRL LF (GAUZE/BANDAGES/DRESSINGS) ×1 IMPLANT
GLOVE BIOGEL M 8.0 STRL (GLOVE) ×2 IMPLANT
GOWN STRL NON-REIN LRG LVL3 (GOWN DISPOSABLE) ×2 IMPLANT
GOWN STRL REIN XL XLG (GOWN DISPOSABLE) ×4 IMPLANT
HEMOSTAT SURGICEL 4X8 (HEMOSTASIS) IMPLANT
IV CATH 14GX2 1/4 (CATHETERS) ×2 IMPLANT
KIT BASIN OR (CUSTOM PROCEDURE TRAY) ×2 IMPLANT
NS IRRIG 1000ML POUR BTL (IV SOLUTION) ×2 IMPLANT
POUCH SPECIMEN RETRIEVAL 10MM (ENDOMECHANICALS) ×2 IMPLANT
SCISSORS LAP 5X35 DISP (ENDOMECHANICALS) ×2 IMPLANT
SET IRRIG TUBING LAPAROSCOPIC (IRRIGATION / IRRIGATOR) ×2 IMPLANT
SLEEVE Z-THREAD 5X100MM (TROCAR) IMPLANT
SOLUTION ANTI FOG 6CC (MISCELLANEOUS) ×2 IMPLANT
SPONGE GAUZE 2X2 STER 10/PKG (GAUZE/BANDAGES/DRESSINGS) ×1
STRIP CLOSURE SKIN 1/2X4 (GAUZE/BANDAGES/DRESSINGS) ×2 IMPLANT
SUT VIC AB 4-0 SH 18 (SUTURE) ×2 IMPLANT
SYR 30ML LL (SYRINGE) ×2 IMPLANT
TAPE CLOTH SURG 4X10 WHT LF (GAUZE/BANDAGES/DRESSINGS) ×2 IMPLANT
TOWEL OR 17X26 10 PK STRL BLUE (TOWEL DISPOSABLE) ×4 IMPLANT
TRAY LAP CHOLE (CUSTOM PROCEDURE TRAY) ×2 IMPLANT
TROCAR BLADELESS OPT 5 75 (ENDOMECHANICALS) ×4 IMPLANT
TROCAR XCEL BLUNT TIP 100MML (ENDOMECHANICALS) ×2 IMPLANT
TROCAR XCEL NON-BLD 11X100MML (ENDOMECHANICALS) IMPLANT
TROCAR Z-THREAD FIOS 11X100 BL (TROCAR) ×2 IMPLANT
TROCAR Z-THREAD FIOS 5X100MM (TROCAR) ×4 IMPLANT
TUBING INSUFFLATION 10FT LAP (TUBING) ×2 IMPLANT

## 2012-07-04 NOTE — Anesthesia Postprocedure Evaluation (Signed)
  Anesthesia Post-op Note  Patient: Chad Guerra  Procedure(s) Performed: Procedure(s) (LRB): LAPAROSCOPIC CHOLECYSTECTOMY WITH INTRAOPERATIVE CHOLANGIOGRAM (N/A)  Patient Location: PACU  Anesthesia Type: General  Level of Consciousness: awake and alert   Airway and Oxygen Therapy: Patient Spontanous Breathing  Post-op Pain: mild  Post-op Assessment: Post-op Vital signs reviewed, Patient's Cardiovascular Status Stable, Respiratory Function Stable, Patent Airway and No signs of Nausea or vomiting  Last Vitals:  Filed Vitals:   07/04/12 0953  BP:   Pulse:   Temp: 36.4 C  Resp: 21    Post-op Vital Signs: stable   Complications: No apparent anesthesia complications

## 2012-07-04 NOTE — ED Notes (Signed)
[place] at bedside with pt family

## 2012-07-04 NOTE — Transfer of Care (Signed)
Immediate Anesthesia Transfer of Care Note  Patient: Chad Guerra  Procedure(s) Performed: Procedure(s): LAPAROSCOPIC CHOLECYSTECTOMY WITH INTRAOPERATIVE CHOLANGIOGRAM (N/A)  Patient Location: PACU  Anesthesia Type:General  Level of Consciousness: awake, alert , oriented and patient cooperative  Airway & Oxygen Therapy: Patient Spontanous Breathing and Patient connected to face mask oxygen  Post-op Assessment: Report given to PACU RN and Post -op Vital signs reviewed and stable  Post vital signs: Reviewed and stable  Complications: No apparent anesthesia complications

## 2012-07-04 NOTE — Op Note (Signed)
Chad Guerra @date @  Procedure: Laparoscopic Cholecystectomy with intraoperative cholangiogram  Surgeon: Wenda Low, MD, FACS Asst:  Darnell Level, MD, FACS  Anes:  General  Drains: None  Findings: Walled off, severe chronic cholecystitis with multiple stones impacted and blocking cystic duct.  Normal IOC  Description of Procedure: The patient was taken to OR 1 on Sunday, Jul 03, 2012 and given general anesthesia.  The patient was prepped with PCMX and draped sterilely. A time out was performed.  Access to the abdomen was achieved with a 5 mm Optiview in the right upper quadrant.  Port placement included three 5 mm and one 11 mm trocar.    The gallbladder was visualized and the fundus was grasped and the gallbladder was elevated. Traction on the infundibulum allowed for successful demonstration of the critical view. Inflammatory changes were significant.  Chronic adhesions had to be taken down for the fundus and the infundibulum was significantly enshrouded.  I attempted a cholangiogram through a rent in the GB and this showed the obstructed infundibulum.  This was then dissected out to reveal the cystic duct and the cystic artery.   .  The cystic duct was identified and clipped up on the gallbladder and an incision was made in the cystic duct and the Reddick catheter was inserted after milking the cystic duct of any debris. A dynamic cholangiogram was performed which demonstrated intrahepatic fill and free flow into the duodenum with no filling defects.    The cystic duct was then triple clipped and divided, the cystic artery was double clipped and divided and then the gallbladder was removed from the gallbladder bed. Removal of the gallbladder from the gallbladder bed was fairly straightforward.  The gallbladder was then placed in a bag and brought out through one of the 10 mm trocar sites. The gallbladder bed was inspected and no bleeding or bile leaks were seen.      Incisions were injected  with marcaine and closed with 4-0 Vicryl and benzoin/steristrips on the skin.  Sponge and needle count were correct.    The patient was taken to the recovery room in satisfactory condition.

## 2012-07-04 NOTE — Anesthesia Preprocedure Evaluation (Addendum)
Anesthesia Evaluation  Patient identified by MRN, date of birth, ID band Patient awake    Reviewed: Allergy & Precautions, H&P , NPO status , Patient's Chart, lab work & pertinent test results, reviewed documented beta blocker date and time   Airway Mallampati: II TM Distance: >3 FB Neck ROM: Limited   Comment: Chronic neck pain for about a year. Considering anterior cervical fusion. Can extend neck somewhat. Dental  (+) Poor Dentition and Dental Advisory Given   Pulmonary neg pulmonary ROS,  breath sounds clear to auscultation  Pulmonary exam normal       Cardiovascular Exercise Tolerance: Good negative cardio ROS  Rhythm:regular Rate:Normal     Neuro/Psych negative neurological ROS  negative psych ROS   GI/Hepatic negative GI ROS, Neg liver ROS,   Endo/Other  negative endocrine ROS  Renal/GU negative Renal ROS  negative genitourinary   Musculoskeletal   Abdominal   Peds  Hematology negative hematology ROS (+)   Anesthesia Other Findings Chronic right shoulder pain s/p rotator cuff repair.  Reproductive/Obstetrics negative OB ROS                          Anesthesia Physical Anesthesia Plan  ASA: II  Anesthesia Plan: General   Post-op Pain Management:    Induction: Intravenous  Airway Management Planned: Oral ETT  Additional Equipment:   Intra-op Plan:   Post-operative Plan: Extubation in OR  Informed Consent: I have reviewed the patients History and Physical, chart, labs and discussed the procedure including the risks, benefits and alternatives for the proposed anesthesia with the patient or authorized representative who has indicated his/her understanding and acceptance.   Dental Advisory Given  Plan Discussed with: CRNA  Anesthesia Plan Comments: (Careful positioning of neck and right shoulder. Avoid IV tylenol. Unclear allergy.)       Anesthesia Quick Evaluation

## 2012-07-04 NOTE — H&P (Signed)
Chief Complaint:  Right upper quadrant pain and cholecystitis by ultrasound  History of Present Illness:  Chad Guerra is an 62 y.o. male presents with a several week history of recurrent right upper quadrant pain, the worst of which was last night.  He came to the ER where an ultrasound showed cholecystitis.  Informed consent was obtained int he ED regarding lap and open cholecystectomy particularly since he is a risk for acute and subacute cholecystitis.  Complications not limited to bleeding, bile leak, bowel injury, and common duct injury were discussed in particular. He is aware of this risk. He has had prior mesh placed in his umbilicus at the [place].  History reviewed. No pertinent past medical history.  Past Surgical History  Procedure Laterality Date  . Hernia repair    . Rotator cuff repair      Current Facility-Administered Medications  Medication Dose Route Frequency Provider Last Rate Last Dose  . cefoTEtan (CEFOTAN) 2 g in dextrose 5 % 50 mL IVPB  2 g Intravenous Q12H John-Adam Bonk, MD   2 g at 07/04/12 0305  . dextrose 5 % in lactated ringers infusion   Intravenous Continuous John-Adam Bonk, MD 125 mL/hr at 07/04/12 0404     Current Outpatient Prescriptions  Medication Sig Dispense Refill  . methocarbamol (ROBAXIN) 500 MG tablet Take 500 mg by mouth 4 (four) times daily as needed (for muscle spasm or pain).      . traMADol (ULTRAM) 50 MG tablet Take 50 mg by mouth every 6 (six) hours as needed (for pain).        Percocet No family history on file. Social History:   reports that he has never smoked. He does not have any smokeless tobacco history on file. He reports that he does not drink alcohol or use illicit drugs.   REVIEW OF SYSTEMS - PERTINENT POSITIVES ONLY: No history of DVT.  Physical Exam:   Blood pressure 136/90, pulse 58, temperature 97.8 F (36.6 C), temperature source Oral, resp. rate 20, height 5\' 10"  (1.778 m), weight 230 lb (104.327 kg), SpO2  97.00%. Body mass index is 33 kg/(m^2).  Gen:  WDWN African American male NAD  Neurological: Alert and oriented to person, place, and time. Motor and sensory function is grossly intact  Head: Normocephalic and atraumatic.  Eyes: Conjunctivae are normal. Pupils are equal, round, and reactive to light. No scleral icterus.  Neck: Normal range of motion. Neck supple. No tracheal deviation or thyromegaly present.  Cardiovascular:  SR without murmurs or gallops.  No carotid bruits Respiratory: Effort normal.  No respiratory distress. No chest wall tenderness. Breath sounds normal.  No wheezes, rales or rhonchi.  Abdomen:  Some soreness in the right upper quadrant. No palpable gallbladder noted. Scar and umbilicus from prior umbilical hernia repair at the [place]. GU: Musculoskeletal: Normal range of motion. Extremities are nontender. No cyanosis, edema or clubbing noted Lymphadenopathy: No cervical, preauricular, postauricular or axillary adenopathy is present Skin: Skin is warm and dry. No rash noted. No diaphoresis. No erythema. No pallor. Pscyh: Normal mood and affect. Behavior is normal. Judgment and thought content normal.   LABORATORY RESULTS: Results for orders placed during the hospital encounter of 07/03/12 (from the past 48 hour(s))  CBC WITH DIFFERENTIAL     Status: Abnormal   Collection Time    07/03/12  8:43 PM      Result Value Range   WBC 3.5 (*) 4.0 - 10.5 K/uL   RBC 4.86  4.22 -  5.81 MIL/uL   Hemoglobin 15.3  13.0 - 17.0 g/dL   HCT 40.9  81.1 - 91.4 %   MCV 89.3  78.0 - 100.0 fL   MCH 31.5  26.0 - 34.0 pg   MCHC 35.3  30.0 - 36.0 g/dL   RDW 78.2  95.6 - 21.3 %   Platelets 225  150 - 400 K/uL   Neutrophils Relative 54  43 - 77 %   Neutro Abs 1.9  1.7 - 7.7 K/uL   Lymphocytes Relative 34  12 - 46 %   Lymphs Abs 1.2  0.7 - 4.0 K/uL   Monocytes Relative 9  3 - 12 %   Monocytes Absolute 0.3  0.1 - 1.0 K/uL   Eosinophils Relative 2  0 - 5 %   Eosinophils Absolute 0.1  0.0 - 0.7  K/uL   Basophils Relative 1  0 - 1 %   Basophils Absolute 0.0  0.0 - 0.1 K/uL  COMPREHENSIVE METABOLIC PANEL     Status: Abnormal   Collection Time    07/03/12  8:43 PM      Result Value Range   Sodium 137  135 - 145 mEq/L   Potassium 3.7  3.5 - 5.1 mEq/L   Chloride 101  96 - 112 mEq/L   CO2 26  19 - 32 mEq/L   Glucose, Bld 105 (*) 70 - 99 mg/dL   BUN 8  6 - 23 mg/dL   Creatinine, Ser 0.86  0.50 - 1.35 mg/dL   Calcium 8.5  8.4 - 57.8 mg/dL   Total Protein 7.3  6.0 - 8.3 g/dL   Albumin 3.7  3.5 - 5.2 g/dL   AST 21  0 - 37 U/L   ALT 22  0 - 53 U/L   Alkaline Phosphatase 98  39 - 117 U/L   Total Bilirubin 0.6  0.3 - 1.2 mg/dL   GFR calc non Af Amer 71 (*) >90 mL/min   GFR calc Af Amer 82 (*) >90 mL/min   Comment:            The eGFR has been calculated     using the CKD EPI equation.     This calculation has not been     validated in all clinical     situations.     eGFR's persistently     <90 mL/min signify     possible Chronic Kidney Disease.  LIPASE, BLOOD     Status: None   Collection Time    07/03/12  8:43 PM      Result Value Range   Lipase 49  11 - 59 U/L  URINALYSIS, ROUTINE W REFLEX MICROSCOPIC     Status: None   Collection Time    07/03/12 11:05 PM      Result Value Range   Color, Urine YELLOW  YELLOW   APPearance CLEAR  CLEAR   Specific Gravity, Urine 1.013  1.005 - 1.030   pH 6.0  5.0 - 8.0   Glucose, UA NEGATIVE  NEGATIVE mg/dL   Hgb urine dipstick NEGATIVE  NEGATIVE   Bilirubin Urine NEGATIVE  NEGATIVE   Ketones, ur NEGATIVE  NEGATIVE mg/dL   Protein, ur NEGATIVE  NEGATIVE mg/dL   Urobilinogen, UA 0.2  0.0 - 1.0 mg/dL   Nitrite NEGATIVE  NEGATIVE   Leukocytes, UA NEGATIVE  NEGATIVE   Comment: MICROSCOPIC NOT DONE ON URINES WITH NEGATIVE PROTEIN, BLOOD, LEUKOCYTES, NITRITE, OR GLUCOSE <1000 mg/dL.    RADIOLOGY  RESULTS: US Abdomen Limited  07/04/2012  *RADIOLOGY REPORT*  Clinical Data:  Right upper quadrant pain.  History of gallstones on previous CT.   LIMITED ABDOMINAL ULTRASOUND - RIGHT UPPER QUADRANT  Comparison:  CT abdomen and pelvis 10/05/2011.  Findings:  Gallbladder:  Multiple small mobile stones are demonstrated in the gallbladder with a non mobile stone in the gallbladder neck.  The gallbladder is decompressed in the gallbladder wall is thickened. The Murphy's sign is positive.  Changes are nonspecific but are consistent with acute cholecystitis in the appropriate clinical setting.  Common bile duct:  Normal caliber with measured diameter of 5 mm.  Liver:  Slightly and homogeneous parenchymal echotexture may represent focal fatty infiltration or artifact from shadowing ribs. No focal lesions identified.  IMPRESSION: Cholelithiasis with contracted and thick-walled gallbladder and possible stone stuck in the gallbladder neck.  Positive Murphy's sign.  Changes are consistent with acute cholecystitis in the appropriate clinical setting.                    Original Report Authenticated By: Burman Nieves, M.D.    Dg Abd Acute W/chest  07/03/2012  *RADIOLOGY REPORT*  Clinical Data: Abdominal pain and nausea.  ACUTE ABDOMEN SERIES (ABDOMEN 2 VIEW & CHEST 1 VIEW)  Comparison: CT abdomen and pelvis 10/05/2011  Findings: The shallow inspiration.  Linear infiltration or atelectasis in the left lung base.  Normal heart size and pulmonary vascularity.  No focal airspace disease or consolidation in the lungs.  No blunting of costophrenic angles.  No pneumothorax. Tortuous aorta.  Postoperative changes in the right shoulder.  Scattered gas and stool in the colon.  No small or large bowel distension.  No free intra-abdominal air.  No radiopaque stones. Mild degenerative changes in the lumbar spine.  IMPRESSION: Linear infiltration or atelectasis in the left lung base. Nonobstructive bowel gas pattern.   Original Report Authenticated By: Burman Nieves, M.D.     Problem List: There is no problem list on file for this patient.   Assessment & Plan: Acute on  chronic cholecystitis with evidence of a stone impacted in the neck of the gallbladder. Plan laparoscopic cholecystectomy    Matt B. Daphine Deutscher, MD, [facility] [phone] beeper [phone]  07/04/2012 6:58 AM

## 2012-07-04 NOTE — ED Provider Notes (Signed)
The patient over for Dr. Silverio Lay, reassess, patient still has pain in the epigastrium to palpation and in the right upper quadrant. Pain is still 7/10, would give him some more pain medicine for this. Ultrasound results show the patient has a thickened gallbladder wall, possible gall stone stuck in the neck, findings are all consistent with cholelithiasis and cholecystitis, or no laboratory changes however still feel that surgical consult is required.  Discussed the case with Dr. Daphine Deutscher, he shall be added on to the surgical schedule in the morning, he's been given IV antibiotics, he is n.p.o. since 1730 yesterday, to keep him on a maintenance rate of D5 LR. Patient is stable and in good condition. Pain needs have been addressed.  Jones Skene, MD 07/04/12 [phone]

## 2012-07-05 ENCOUNTER — Encounter (HOSPITAL_COMMUNITY): Payer: Self-pay | Admitting: Surgery

## 2012-07-05 LAB — COMPREHENSIVE METABOLIC PANEL
Albumin: 3.1 g/dL — ABNORMAL LOW (ref 3.5–5.2)
Alkaline Phosphatase: 89 U/L (ref 39–117)
BUN: 4 mg/dL — ABNORMAL LOW (ref 6–23)
Chloride: 103 mEq/L (ref 96–112)
Creatinine, Ser: 1.11 mg/dL (ref 0.50–1.35)
GFR calc Af Amer: 82 mL/min — ABNORMAL LOW (ref >90)
GFR calc non Af Amer: 70 mL/min — ABNORMAL LOW (ref >90)
Glucose, Bld: 109 mg/dL — ABNORMAL HIGH (ref 70–99)
Potassium: 3.5 mEq/L (ref 3.5–5.1)
Total Bilirubin: 1 mg/dL (ref 0.3–1.2)

## 2012-07-05 LAB — CBC
MCV: 90.9 fL (ref 78.0–100.0)
Platelets: 197 10*3/uL (ref 150–400)
RBC: 4.53 MIL/uL (ref 4.22–5.81)
RDW: 12.1 % (ref 11.5–15.5)
WBC: 3.6 10*3/uL — ABNORMAL LOW (ref 4.0–10.5)

## 2012-07-05 MED ORDER — TRAMADOL HCL 50 MG PO TABS: 50.0000 mg | ORAL_TABLET | Freq: Four times a day (QID) | ORAL | Status: DC | PRN

## 2012-07-05 NOTE — Progress Notes (Signed)
1 Day Post-Op  Subjective: Doing ok.  Objective: Vital signs in last 24 hours: Temp:  [97.1 F (36.2 C)-98.2 F (36.8 C)] 98.2 F (36.8 C) (02/10 0529) Pulse Rate:  [55-74] 59 (02/10 0529) Resp:  [15-21] 18 (02/10 0529) BP: (129-166)/(76-95) 151/93 mmHg (02/10 0529) SpO2:  [95 %-100 %] 100 % (02/10 0529) Last BM Date:  (pre-op)  Intake/Output from previous day: 02/09 0701 - 02/10 0700 In: 5038.7 [P.O.:1422; I.V.:3616.7] Out: 3630 [Urine:3625; Blood:5] Intake/Output this shift:    Incision/Wound:clean dry intact dressings slight distension  Lab Results:   Recent Labs  07/03/12 2043 07/05/12 0425  WBC 3.5* 3.6*  HGB 15.3 14.2  HCT 43.4 41.2  PLT 225 197   BMET  Recent Labs  07/03/12 2043 07/05/12 0425  NA 137 138  K 3.7 3.5  CL 101 103  CO2 26 28  GLUCOSE 105* 109*  BUN 8 4*  CREATININE 1.10 1.11  CALCIUM 8.5 8.3*   PT/INR No results found for this basename: LABPROT, INR,  in the last 72 hours ABG No results found for this basename: PHART, PCO2, PO2, HCO3,  in the last 72 hours  Studies/Results: Dg Cholangiogram Operative  07/04/2012  *RADIOLOGY REPORT*  Clinical Data:   Gallstones with thick-walled gallbladder  INTRAOPERATIVE CHOLANGIOGRAM  Technique:  Cholangiographic images from the C-arm fluoroscopic device were submitted for interpretation post-operatively.  Please see the procedural report for the amount of contrast and the fluoroscopy time utilized.  Comparison:  Ultrasound abdomen of 07/04/2012  Findings:  Contrast was injected via the cystic duct.  There is good visualization of the common bile duct and portions of the intrahepatic ducts. The distal common bile duct is not optimally visualized.  There does appear to be passage of contrast into the duodenal loop with no obstruction.  No filling defect is seen.  IMPRESSION: Negative intraoperative cholangiogram.   Original Report Authenticated By: Dwyane Dee, M.D.    US Abdomen Limited  07/04/2012   *RADIOLOGY REPORT*  Clinical Data:  Right upper quadrant pain.  History of gallstones on previous CT.  LIMITED ABDOMINAL ULTRASOUND - RIGHT UPPER QUADRANT  Comparison:  CT abdomen and pelvis 10/05/2011.  Findings:  Gallbladder:  Multiple small mobile stones are demonstrated in the gallbladder with a non mobile stone in the gallbladder neck.  The gallbladder is decompressed in the gallbladder wall is thickened. The Murphy's sign is positive.  Changes are nonspecific but are consistent with acute cholecystitis in the appropriate clinical setting.  Common bile duct:  Normal caliber with measured diameter of 5 mm.  Liver:  Slightly and homogeneous parenchymal echotexture may represent focal fatty infiltration or artifact from shadowing ribs. No focal lesions identified.  IMPRESSION: Cholelithiasis with contracted and thick-walled gallbladder and possible stone stuck in the gallbladder neck.  Positive Murphy's sign.  Changes are consistent with acute cholecystitis in the appropriate clinical setting.                    Original Report Authenticated By: Burman Nieves, M.D.    Dg Abd Acute W/chest  07/03/2012  *RADIOLOGY REPORT*  Clinical Data: Abdominal pain and nausea.  ACUTE ABDOMEN SERIES (ABDOMEN 2 VIEW & CHEST 1 VIEW)  Comparison: CT abdomen and pelvis 10/05/2011  Findings: The shallow inspiration.  Linear infiltration or atelectasis in the left lung base.  Normal heart size and pulmonary vascularity.  No focal airspace disease or consolidation in the lungs.  No blunting of costophrenic angles.  No pneumothorax. Tortuous aorta.  Postoperative changes in the right shoulder.  Scattered gas and stool in the colon.  No small or large bowel distension.  No free intra-abdominal air.  No radiopaque stones. Mild degenerative changes in the lumbar spine.  IMPRESSION: Linear infiltration or atelectasis in the left lung base. Nonobstructive bowel gas pattern.   Original Report Authenticated By: Burman Nieves, M.D.      Anti-infectives: Anti-infectives   Start     Dose/Rate Route Frequency Ordered Stop   07/04/12 1400  cefOXitin (MEFOXIN) 2 g in dextrose 5 % 50 mL IVPB  Status:  Discontinued     2 g 100 mL/hr over 30 Minutes Intravenous  Once 07/04/12 1117 07/04/12 1228   07/04/12 1200  cefOXitin (MEFOXIN) 1 g in dextrose 5 % 50 mL IVPB  Status:  Discontinued     1 g 100 mL/hr over 30 Minutes Intravenous 4 times per day 07/04/12 1117 07/04/12 1152   07/04/12 0245  cefoTEtan (CEFOTAN) 2 g in dextrose 5 % 50 mL IVPB  Status:  Discontinued     2 g 100 mL/hr over 30 Minutes Intravenous Every 12 hours 07/04/12 0237 07/04/12 1117      Assessment/Plan: s/p Procedure(s): LAPAROSCOPIC CHOLECYSTECTOMY WITH INTRAOPERATIVE CHOLANGIOGRAM (N/A) Discharge  LOS: 2 days    Chad Roediger A. 07/05/2012

## 2012-07-05 NOTE — Discharge Summary (Signed)
Physician Discharge Summary  Patient ID: Chad Guerra MRN: [id number] DOB/AGE: Aug 18, 1950 62 y.o.  Admit date: 07/03/2012 Discharge date: 07/05/2012  Admission Diagnoses: chronic cholecystitis  Discharge Diagnoses: same Active Problems:   S/P laparoscopic cholecystectomy Feb 2014   Discharged Condition: good  Hospital Course: Unremarkable.  Tolerating diet POD 1 and good pain control with stable vital signs.  Consults: None  Significant Diagnostic Studies: labs:  CBC    Component Value Date/Time   WBC 3.6* 07/05/2012 0425   RBC 4.53 07/05/2012 0425   HGB 14.2 07/05/2012 0425   HCT 41.2 07/05/2012 0425   PLT 197 07/05/2012 0425   MCV 90.9 07/05/2012 0425   MCH 31.3 07/05/2012 0425   MCHC 34.5 07/05/2012 0425   RDW 12.1 07/05/2012 0425   LYMPHSABS 1.2 07/03/2012 2043   MONOABS 0.3 07/03/2012 2043   EOSABS 0.1 07/03/2012 2043   BASOSABS 0.0 07/03/2012 2043     Treatments: surgery: lap chole IOC  Discharge Exam: Blood pressure 151/93, pulse 59, temperature 98.2 F (36.8 C), temperature source Oral, resp. rate 18, height 5\' 10"  (1.778 m), weight 230 lb (104.327 kg), SpO2 100.00%. Incision/Wound:CLEAN DRY INTACT  Disposition: 01-Home or Self Care     Medication List    ASK your doctor about these medications       methocarbamol 500 MG tablet  Commonly known as:  ROBAXIN  Take 500 mg by mouth 4 (four) times daily as needed (for muscle spasm or pain).     traMADol 50 MG tablet  Commonly known as:  ULTRAM  Take 50 mg by mouth every 6 (six) hours as needed (for pain).           Follow-up Information   Follow up with Luretha Murphy B, MD In 3 weeks.   Contact information:   [address] [place] [address] [phone]       Signed: Dortha Schwalbe. 07/05/2012, 9:51 AM

## 2012-07-09 ENCOUNTER — Telehealth (INDEPENDENT_AMBULATORY_CARE_PROVIDER_SITE_OTHER): Payer: Self-pay

## 2012-07-09 NOTE — Telephone Encounter (Signed)
Pt calling in c/o drainage at top incision from gallbladder surgery. The pt states it looks brownish red w/no odor,no fever,and no redness. I advised pt to clean the area with soap and water keep covered. I advised pt what to look for if the drainage gets worse to call our office. The pt understands.

## 2012-07-12 ENCOUNTER — Telehealth (INDEPENDENT_AMBULATORY_CARE_PROVIDER_SITE_OTHER): Payer: Self-pay

## 2012-07-12 NOTE — Telephone Encounter (Signed)
Called pt to let him know of his new appt date and time: Monday, March 3 @ 11a

## 2012-07-26 ENCOUNTER — Encounter (INDEPENDENT_AMBULATORY_CARE_PROVIDER_SITE_OTHER): Payer: Self-pay | Admitting: Surgery

## 2012-07-26 ENCOUNTER — Ambulatory Visit (INDEPENDENT_AMBULATORY_CARE_PROVIDER_SITE_OTHER): Payer: Non-veteran care | Admitting: Surgery

## 2012-07-26 VITALS — BP 162/84 | HR 68 | Temp 97.1°F | Resp 16 | Ht 70.5 in | Wt 233.4 lb

## 2012-07-26 NOTE — Patient Instructions (Addendum)

## 2012-07-26 NOTE — Progress Notes (Signed)
Chad Guerra 62 y.o.  Body mass index is 33.01 kg/(m^2).  Patient Active Problem List  Diagnosis  . S/P laparoscopic cholecystectomy Feb 2014    Allergies  Allergen Reactions  . Percocet (Oxycodone-Acetaminophen) Other (See Comments)    Shakes, chills    Past Surgical History  Procedure Laterality Date  . Hernia repair    . Rotator cuff repair    . Cholecystectomy N/A 07/04/2012    Procedure: LAPAROSCOPIC CHOLECYSTECTOMY WITH INTRAOPERATIVE CHOLANGIOGRAM;  Surgeon: Valarie Merino, MD;  Location: [facility];  Service: General;  Laterality: N/A;   No primary provider on file. No diagnosis found.  [name] Doing very well after his laparoscopic cholecystectomy for a very inflamed gallbladder. Path report showed chronic cholecystitis with gallstones. His incisions have healed although he does have some itching and a delayed dermatitis probably from the Steri-Strips. I advised him to use a moisturizing cream on these.  His right arm is in a sling and he has a rotator cuff tear there. He also mention that Dr. Shon Baton would be need to be operated on his neck soon.  From my standpoint he is cleared to have general anesthesia and neck surgery.  Doing well after laparoscopic cholecystectomy. Plan see when necessary Matt B. Daphine Deutscher, MD, [facility] [phone] beeper [phone]  07/26/2012 1:03 PM

## 2012-08-05 ENCOUNTER — Telehealth (INDEPENDENT_AMBULATORY_CARE_PROVIDER_SITE_OTHER): Payer: Self-pay | Admitting: General Surgery

## 2012-08-05 NOTE — Telephone Encounter (Signed)
Pt called to ask if he could be seen for a new painful and hard area where there had been an IV running when he had surgery.  Dr. Daphine Deutscher is not available and pt cannot come to Urgent office today.  He will call back tomorrow if it is not improved.  Instructed to take Aleve or Ibuprofen for the anti-inflammatory effects.

## 2012-08-10 ENCOUNTER — Telehealth (INDEPENDENT_AMBULATORY_CARE_PROVIDER_SITE_OTHER): Payer: Self-pay | Admitting: General Surgery

## 2012-08-10 ENCOUNTER — Encounter (INDEPENDENT_AMBULATORY_CARE_PROVIDER_SITE_OTHER): Payer: Self-pay | Admitting: Surgery

## 2012-08-10 ENCOUNTER — Encounter (INDEPENDENT_AMBULATORY_CARE_PROVIDER_SITE_OTHER): Payer: Self-pay

## 2012-08-10 ENCOUNTER — Ambulatory Visit (INDEPENDENT_AMBULATORY_CARE_PROVIDER_SITE_OTHER): Payer: Non-veteran care | Admitting: Surgery

## 2012-08-10 VITALS — BP 150/82 | HR 86 | Resp 18 | Ht 70.5 in | Wt 233.0 lb

## 2012-08-10 DIAGNOSIS — Z9049 Acquired absence of other specified parts of digestive tract: Secondary | ICD-10-CM

## 2012-08-10 NOTE — Telephone Encounter (Signed)
Pt called to ask for a letter from Dr. Daphine Deutscher to Dr. Venita Lick, orthopedic surgeon, saying it is OK for him to have surgery on his neck.  He would like the letter FAXed to him [phone]) and a copy of it to his attorney---attn:  Ewing Montez Morita at [place] # [phone].  Made appt for Dr. Daphine Deutscher to inspect an IV site on the pt' arm that he states is causing him discomfort and itching.

## 2012-08-10 NOTE — Progress Notes (Signed)
Chad Guerra 62 y.o.  Body mass index is 32.95 kg/(m^2).  Patient Active Problem List  Diagnosis  . S/P laparoscopic cholecystectomy Feb 2014    Allergies  Allergen Reactions  . Percocet (Oxycodone-Acetaminophen) Other (See Comments)    Shakes, chills    Past Surgical History  Procedure Laterality Date  . Hernia repair    . Rotator cuff repair    . Cholecystectomy N/A 07/04/2012    Procedure: LAPAROSCOPIC CHOLECYSTECTOMY WITH INTRAOPERATIVE CHOLANGIOGRAM;  Surgeon: Valarie Merino, MD;  Location: [facility];  Service: General;  Laterality: N/A;   No primary provider on file. No diagnosis found.  Was concerned about a knot on his left forearm along a vein.  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  It is itching a bit now but is not red.  Advised that an aspirin a day for a while will help this resolve.  He is about to have shoulder and neck surgery and this is OK from my standpoint.  I had mentioned that release in my last note.  Return prn Matt B. Daphine Deutscher, MD, [facility] [phone] beeper [phone]  08/10/2012 5:22 PM

## 2012-08-23 ENCOUNTER — Encounter (HOSPITAL_COMMUNITY): Payer: Self-pay | Admitting: Pharmacy Technician

## 2012-08-23 ENCOUNTER — Encounter (HOSPITAL_COMMUNITY)
Admission: RE | Admit: 2012-08-23 | Discharge: 2012-08-23 | Disposition: A | Discharge status: Home / Self Care | Payer: Worker's Compensation | Source: Ambulatory Visit | Attending: Orthopedic Surgery | Admitting: Orthopedic Surgery

## 2012-08-23 ENCOUNTER — Encounter (HOSPITAL_COMMUNITY): Payer: Self-pay

## 2012-08-23 ENCOUNTER — Ambulatory Visit (HOSPITAL_COMMUNITY): Admission: RE | Admit: 2012-08-23 | Payer: Worker's Compensation | Source: Ambulatory Visit

## 2012-08-23 HISTORY — DX: Other specified health status: Z78.9

## 2012-08-23 LAB — CBC
MCH: 31.5 pg (ref 26.0–34.0)
MCV: 86.7 fL (ref 78.0–100.0)
Platelets: 206 10*3/uL (ref 150–400)
RBC: 5.04 MIL/uL (ref 4.22–5.81)
RDW: 12.3 % (ref 11.5–15.5)

## 2012-08-23 LAB — SURGICAL PCR SCREEN: MRSA, PCR: NEGATIVE

## 2012-08-23 LAB — BASIC METABOLIC PANEL
CO2: 27 mEq/L (ref 19–32)
Calcium: 9 mg/dL (ref 8.4–10.5)
Creatinine, Ser: 1.06 mg/dL (ref 0.50–1.35)

## 2012-08-23 NOTE — Pre-Procedure Instructions (Signed)
Chad Guerra  08/23/2012   Your procedure is scheduled on:  Wednesday  08/25/12  Report to [facility] at 1100 AM.  Call this number if you have problems the morning of surgery: [phone]   Remember:   Do not eat food or drink liquids after midnight.   Take these medicines the morning of surgery with A SIP OF WATER: TRAMADOL  IF NEEDED    Do not wear jewelry, make-up or nail polish.  Do not wear lotions, powders, or perfumes. You may wear deodorant.  Do not shave 48 hours prior to surgery. Men may shave face and neck.  Do not bring valuables to the hospital.  Contacts, dentures or bridgework may not be worn into surgery.  Leave suitcase in the car. After surgery it may be brought to your room.  For patients admitted to the hospital, checkout time is 11:00 AM the day of  discharge.   Patients discharged the day of surgery will not be allowed to drive  home.  Name and phone number of your driver:  Special Instructions: Shower using CHG 2 nights before surgery and the night before surgery.  If you shower the day of surgery use CHG.  Use special wash - you have one bottle of CHG for all showers.  You should use approximately 1/3 of the bottle for each shower.   Please read over the following fact sheets that you were given: Pain Booklet, Coughing and Deep Breathing, MRSA Information and Surgical Site Infection Prevention                    BRING BRACE TO HOSPITAL!!!!!!!!

## 2012-08-24 MED ORDER — CEFAZOLIN SODIUM-DEXTROSE 2-3 GM-% IV SOLR
2.0000 g | INTRAVENOUS | Status: AC
  Administered 2012-08-25: 2 g via INTRAVENOUS
  Filled 2012-08-24: qty 50

## 2012-08-25 ENCOUNTER — Encounter (HOSPITAL_COMMUNITY)
Admission: RE | Disposition: A | Discharge status: Home / Self Care | Payer: Self-pay | Source: Ambulatory Visit | Attending: Orthopedic Surgery

## 2012-08-25 ENCOUNTER — Observation Stay (HOSPITAL_COMMUNITY): Payer: Worker's Compensation

## 2012-08-25 ENCOUNTER — Ambulatory Visit (HOSPITAL_COMMUNITY): Payer: Worker's Compensation

## 2012-08-25 ENCOUNTER — Ambulatory Visit (HOSPITAL_COMMUNITY): Payer: Worker's Compensation | Admitting: Anesthesiology

## 2012-08-25 ENCOUNTER — Encounter (HOSPITAL_COMMUNITY): Payer: Self-pay | Admitting: Anesthesiology

## 2012-08-25 ENCOUNTER — Observation Stay (HOSPITAL_COMMUNITY)
Admission: RE | Admit: 2012-08-25 | Discharge: 2012-08-26 | Disposition: A | Discharge status: Home / Self Care | Payer: Worker's Compensation | Source: Ambulatory Visit | Attending: Orthopedic Surgery | Admitting: Orthopedic Surgery

## 2012-08-25 ENCOUNTER — Encounter (HOSPITAL_COMMUNITY): Payer: Self-pay | Admitting: *Deleted

## 2012-08-25 DIAGNOSIS — M47812 Spondylosis without myelopathy or radiculopathy, cervical region: Principal | ICD-10-CM | POA: Insufficient documentation

## 2012-08-25 DIAGNOSIS — Z01812 Encounter for preprocedural laboratory examination: Secondary | ICD-10-CM | POA: Insufficient documentation

## 2012-08-25 DIAGNOSIS — I10 Essential (primary) hypertension: Secondary | ICD-10-CM | POA: Insufficient documentation

## 2012-08-25 HISTORY — PX: ANTERIOR CERVICAL DECOMP/DISCECTOMY FUSION: SHX1161

## 2012-08-25 HISTORY — PX: ANTERIOR FUSION CERVICAL SPINE: SUR626

## 2012-08-25 SURGERY — ANTERIOR CERVICAL DECOMPRESSION/DISCECTOMY FUSION 1 LEVEL
Anesthesia: General | Site: Spine Cervical | Wound class: Clean

## 2012-08-25 MED ORDER — ONDANSETRON HCL 4 MG/2ML IJ SOLN: 4.0000 mg | INTRAMUSCULAR | Status: DC | PRN

## 2012-08-25 MED ORDER — NEOSTIGMINE METHYLSULFATE 1 MG/ML IJ SOLN
INTRAMUSCULAR | Status: DC | PRN
  Administered 2012-08-25: 4 mg via INTRAVENOUS

## 2012-08-25 MED ORDER — ROCURONIUM BROMIDE 100 MG/10ML IV SOLN
INTRAVENOUS | Status: DC | PRN
  Administered 2012-08-25: 50 mg via INTRAVENOUS

## 2012-08-25 MED ORDER — DEXAMETHASONE SODIUM PHOSPHATE 4 MG/ML IJ SOLN
4.0000 mg | Freq: Once | INTRAMUSCULAR | Status: AC
  Administered 2012-08-25: 10 mg via INTRAVENOUS

## 2012-08-25 MED ORDER — DEXAMETHASONE 4 MG PO TABS
4.0000 mg | ORAL_TABLET | Freq: Four times a day (QID) | ORAL | Status: DC
  Administered 2012-08-25 – 2012-08-26 (×5): 4 mg via ORAL
  Filled 2012-08-25 (×7): qty 1

## 2012-08-25 MED ORDER — METHOCARBAMOL 500 MG PO TABS
500.0000 mg | ORAL_TABLET | Freq: Four times a day (QID) | ORAL | Status: DC | PRN
  Administered 2012-08-25 – 2012-08-26 (×4): 500 mg via ORAL
  Filled 2012-08-25 (×4): qty 1

## 2012-08-25 MED ORDER — 0.9 % SODIUM CHLORIDE (POUR BTL) OPTIME
TOPICAL | Status: DC | PRN
  Administered 2012-08-25: 1000 mL

## 2012-08-25 MED ORDER — PHENYLEPHRINE HCL 10 MG/ML IJ SOLN
10.0000 mg | INTRAVENOUS | Status: DC | PRN
  Administered 2012-08-25: 15 ug/min via INTRAVENOUS

## 2012-08-25 MED ORDER — LIDOCAINE HCL 4 % MT SOLN
OROMUCOSAL | Status: DC | PRN
  Administered 2012-08-25: 4 mL via TOPICAL

## 2012-08-25 MED ORDER — SODIUM CHLORIDE 0.9 % IJ SOLN: 3.0000 mL | INTRAMUSCULAR | Status: DC | PRN

## 2012-08-25 MED ORDER — LIDOCAINE HCL (CARDIAC) 20 MG/ML IV SOLN
INTRAVENOUS | Status: DC | PRN
  Administered 2012-08-25: 80 mg via INTRAVENOUS

## 2012-08-25 MED ORDER — THROMBIN 5000 UNITS EX SOLR
CUTANEOUS | Status: DC | PRN
  Administered 2012-08-25: 2000 [IU] via TOPICAL

## 2012-08-25 MED ORDER — PHENOL 1.4 % MT LIQD
1.0000 | OROMUCOSAL | Status: DC | PRN
  Administered 2012-08-26 (×2): 1 via OROMUCOSAL
  Filled 2012-08-25 (×2): qty 177

## 2012-08-25 MED ORDER — HYDROMORPHONE HCL 2 MG PO TABS
2.0000 mg | ORAL_TABLET | ORAL | Status: DC | PRN
  Administered 2012-08-25 – 2012-08-26 (×2): 2 mg via ORAL
  Filled 2012-08-25 (×2): qty 1

## 2012-08-25 MED ORDER — CEFAZOLIN SODIUM 1-5 GM-% IV SOLN
1.0000 g | Freq: Three times a day (TID) | INTRAVENOUS | Status: AC
  Administered 2012-08-25 – 2012-08-26 (×2): 1 g via INTRAVENOUS
  Filled 2012-08-25 (×2): qty 50

## 2012-08-25 MED ORDER — METHOCARBAMOL 100 MG/ML IJ SOLN
500.0000 mg | Freq: Four times a day (QID) | INTRAVENOUS | Status: DC | PRN
  Filled 2012-08-25: qty 5

## 2012-08-25 MED ORDER — ZOLPIDEM TARTRATE 5 MG PO TABS
5.0000 mg | ORAL_TABLET | Freq: Every evening | ORAL | Status: DC | PRN
  Administered 2012-08-26: 5 mg via ORAL
  Filled 2012-08-25: qty 1

## 2012-08-25 MED ORDER — ONDANSETRON HCL 4 MG/2ML IJ SOLN
INTRAMUSCULAR | Status: DC | PRN
  Administered 2012-08-25: 4 mg via INTRAVENOUS

## 2012-08-25 MED ORDER — ACETAMINOPHEN 10 MG/ML IV SOLN
INTRAVENOUS | Status: AC
  Filled 2012-08-25: qty 100

## 2012-08-25 MED ORDER — ARTIFICIAL TEARS OP OINT
TOPICAL_OINTMENT | OPHTHALMIC | Status: DC | PRN
  Administered 2012-08-25: 1 via OPHTHALMIC

## 2012-08-25 MED ORDER — THROMBIN 20000 UNITS EX SOLR
CUTANEOUS | Status: AC
  Filled 2012-08-25: qty 20000

## 2012-08-25 MED ORDER — FENTANYL CITRATE 0.05 MG/ML IJ SOLN: 25.0000 ug | INTRAMUSCULAR | Status: DC | PRN

## 2012-08-25 MED ORDER — SODIUM CHLORIDE 0.9 % IJ SOLN: 3.0000 mL | Freq: Two times a day (BID) | INTRAMUSCULAR | Status: DC

## 2012-08-25 MED ORDER — SODIUM CHLORIDE 0.9 % IV SOLN: 250.0000 mL | INTRAVENOUS | Status: DC

## 2012-08-25 MED ORDER — FENTANYL CITRATE 0.05 MG/ML IJ SOLN
INTRAMUSCULAR | Status: AC
  Filled 2012-08-25: qty 2

## 2012-08-25 MED ORDER — FENTANYL CITRATE 0.05 MG/ML IJ SOLN
INTRAMUSCULAR | Status: DC | PRN
  Administered 2012-08-25 (×2): 25 ug via INTRAVENOUS
  Administered 2012-08-25: 200 ug via INTRAVENOUS

## 2012-08-25 MED ORDER — EPHEDRINE SULFATE 50 MG/ML IJ SOLN
INTRAMUSCULAR | Status: DC | PRN
  Administered 2012-08-25: 5 mg via INTRAVENOUS
  Administered 2012-08-25: 10 mg via INTRAVENOUS

## 2012-08-25 MED ORDER — MIDAZOLAM HCL 5 MG/5ML IJ SOLN
INTRAMUSCULAR | Status: DC | PRN
  Administered 2012-08-25 (×2): 1 mg via INTRAVENOUS

## 2012-08-25 MED ORDER — TRAMADOL HCL 50 MG PO TABS
50.0000 mg | ORAL_TABLET | Freq: Four times a day (QID) | ORAL | Status: DC
  Administered 2012-08-25 – 2012-08-26 (×2): 50 mg via ORAL
  Filled 2012-08-25 (×3): qty 1

## 2012-08-25 MED ORDER — DEXAMETHASONE SODIUM PHOSPHATE 4 MG/ML IJ SOLN
4.0000 mg | Freq: Four times a day (QID) | INTRAMUSCULAR | Status: DC
  Filled 2012-08-25 (×7): qty 1

## 2012-08-25 MED ORDER — THROMBIN 20000 UNITS EX SOLR
CUTANEOUS | Status: DC | PRN
  Administered 2012-08-25: 15:00:00 via TOPICAL

## 2012-08-25 MED ORDER — PROPOFOL 10 MG/ML IV BOLUS
INTRAVENOUS | Status: DC | PRN
  Administered 2012-08-25: 200 mg via INTRAVENOUS

## 2012-08-25 MED ORDER — LACTATED RINGERS IV SOLN
INTRAVENOUS | Status: DC | PRN
  Administered 2012-08-25 (×2): via INTRAVENOUS

## 2012-08-25 MED ORDER — ACETAMINOPHEN 10 MG/ML IV SOLN
1000.0000 mg | Freq: Once | INTRAVENOUS | Status: AC
  Administered 2012-08-25: 1000 mg via INTRAVENOUS

## 2012-08-25 MED ORDER — BUPIVACAINE-EPINEPHRINE PF 0.5-1:200000 % IJ SOLN
INTRAMUSCULAR | Status: AC
  Filled 2012-08-25: qty 30

## 2012-08-25 MED ORDER — LACTATED RINGERS IV SOLN
INTRAVENOUS | Status: DC
  Administered 2012-08-26: 03:00:00 via INTRAVENOUS

## 2012-08-25 MED ORDER — ACETAMINOPHEN 10 MG/ML IV SOLN
1000.0000 mg | Freq: Four times a day (QID) | INTRAVENOUS | Status: AC
  Administered 2012-08-25 – 2012-08-26 (×3): 1000 mg via INTRAVENOUS
  Filled 2012-08-25 (×4): qty 100

## 2012-08-25 MED ORDER — MENTHOL 3 MG MT LOZG: 1.0000 | LOZENGE | OROMUCOSAL | Status: DC | PRN

## 2012-08-25 MED ORDER — BUPIVACAINE-EPINEPHRINE 0.25% -1:200000 IJ SOLN
INTRAMUSCULAR | Status: DC | PRN
  Administered 2012-08-25: 4 mL

## 2012-08-25 MED ORDER — LACTATED RINGERS IV SOLN
INTRAVENOUS | Status: DC
  Administered 2012-08-25: 12:00:00 via INTRAVENOUS

## 2012-08-25 MED ORDER — GLYCOPYRROLATE 0.2 MG/ML IJ SOLN
INTRAMUSCULAR | Status: DC | PRN
  Administered 2012-08-25: 0.6 mg via INTRAVENOUS

## 2012-08-25 SURGICAL SUPPLY — 60 items
BIT DRILL SKYLINE 12MM (BIT) ×1 IMPLANT
BLADE SURG ROTATE 9660 (MISCELLANEOUS) IMPLANT
BUR EGG ELITE 4.0 (BURR) IMPLANT
BUR MATCHSTICK NEURO 3.0 LAGG (BURR) IMPLANT
CANISTER SUCTION 2500CC (MISCELLANEOUS) ×2 IMPLANT
CLOTH BEACON ORANGE TIMEOUT ST (SAFETY) ×2 IMPLANT
CLSR STERI-STRIP ANTIMIC 1/2X4 (GAUZE/BANDAGES/DRESSINGS) ×2 IMPLANT
CORDS BIPOLAR (ELECTRODE) ×2 IMPLANT
COVER SURGICAL LIGHT HANDLE (MISCELLANEOUS) ×4 IMPLANT
CRADLE DONUT ADULT HEAD (MISCELLANEOUS) ×2 IMPLANT
DEVICE ENDSKLTN TC MED 8MM (Orthopedic Implant) ×1 IMPLANT
DRAPE C-ARM 42X72 X-RAY (DRAPES) ×2 IMPLANT
DRAPE POUCH INSTRU U-SHP 10X18 (DRAPES) ×2 IMPLANT
DRAPE SURG 17X23 STRL (DRAPES) ×2 IMPLANT
DRAPE U-SHAPE 47X51 STRL (DRAPES) ×2 IMPLANT
DRILL BIT SKYLINE 12MM (BIT) ×1
DRSG MEPILEX BORDER 4X4 (GAUZE/BANDAGES/DRESSINGS) ×2 IMPLANT
DURAPREP 26ML APPLICATOR (WOUND CARE) ×2 IMPLANT
ELECT COATED BLADE 2.86 ST (ELECTRODE) ×2 IMPLANT
ELECT REM PT RETURN 9FT ADLT (ELECTROSURGICAL) ×2
ELECTRODE REM PT RTRN 9FT ADLT (ELECTROSURGICAL) ×1 IMPLANT
ENDOSKELTON TC IMPLANT 8MM MED (Orthopedic Implant) ×2 IMPLANT
GLOVE BIOGEL PI IND STRL 8.5 (GLOVE) ×1 IMPLANT
GLOVE BIOGEL PI INDICATOR 8.5 (GLOVE) ×1
GLOVE ECLIPSE 8.5 STRL (GLOVE) ×2 IMPLANT
GOWN PREVENTION PLUS XXLARGE (GOWN DISPOSABLE) ×2 IMPLANT
GOWN STRL REIN XL XLG (GOWN DISPOSABLE) ×4 IMPLANT
KIT BASIN OR (CUSTOM PROCEDURE TRAY) ×2 IMPLANT
KIT ROOM TURNOVER OR (KITS) ×2 IMPLANT
NEEDLE SPNL 18GX3.5 QUINCKE PK (NEEDLE) ×2 IMPLANT
NS IRRIG 1000ML POUR BTL (IV SOLUTION) ×2 IMPLANT
PACK ORTHO CERVICAL (CUSTOM PROCEDURE TRAY) ×2 IMPLANT
PACK UNIVERSAL I (CUSTOM PROCEDURE TRAY) ×2 IMPLANT
PAD ARMBOARD 7.5X6 YLW CONV (MISCELLANEOUS) ×4 IMPLANT
PATTIES SURGICAL .25X.25 (GAUZE/BANDAGES/DRESSINGS) IMPLANT
PIN RETAINER PRODISC 14 MM (PIN) ×4 IMPLANT
PIN TEMP SKYLINE THREADED (PIN) ×2 IMPLANT
PLATE SKYLINE 12MM (Plate) ×2 IMPLANT
PUTTY BONE DBX 2.5 MIS (Bone Implant) ×2 IMPLANT
RESTRAINT LIMB HOLDER UNIV (RESTRAINTS) ×2 IMPLANT
SCREW SELF DRILL SKYLINE 12MM (Screw) ×2 IMPLANT
SCREW SKYLINE 14MM SD-VA (Screw) ×2 IMPLANT
SCREW SKYLINE VAR OS 14MM (Screw) ×2 IMPLANT
SCREW SKYLINE VARIABLE LG (Screw) ×2 IMPLANT
SCREW VARIABLE SELF TAP 12MM (Screw) ×6 IMPLANT
SPONGE INTESTINAL PEANUT (DISPOSABLE) ×2 IMPLANT
SPONGE SURGIFOAM ABS GEL 100 (HEMOSTASIS) ×2 IMPLANT
SURGIFLO TRUKIT (HEMOSTASIS) IMPLANT
SUT BONE WAX W31G (SUTURE) ×2 IMPLANT
SUT MNCRL AB 3-0 PS2 18 (SUTURE) ×2 IMPLANT
SUT SILK 2 0 (SUTURE)
SUT SILK 2-0 18XBRD TIE 12 (SUTURE) IMPLANT
SUT VIC AB 2-0 CT1 18 (SUTURE) ×2 IMPLANT
SYR BULB IRRIGATION 50ML (SYRINGE) ×2 IMPLANT
SYR CONTROL 10ML LL (SYRINGE) ×2 IMPLANT
TAPE CLOTH 4X10 WHT NS (GAUZE/BANDAGES/DRESSINGS) ×2 IMPLANT
TAPE UMBILICAL COTTON 1/8X30 (MISCELLANEOUS) ×2 IMPLANT
TOWEL OR 17X24 6PK STRL BLUE (TOWEL DISPOSABLE) ×2 IMPLANT
TOWEL OR 17X26 10 PK STRL BLUE (TOWEL DISPOSABLE) ×2 IMPLANT
WATER STERILE IRR 1000ML POUR (IV SOLUTION) ×2 IMPLANT

## 2012-08-25 NOTE — Brief Op Note (Signed)
08/25/2012  3:57 PM  PATIENT:  Chad Guerra  62 y.o. male  PRE-OPERATIVE DIAGNOSIS:  CERVICAL SPONDYLOSIS  POST-OPERATIVE DIAGNOSIS:  CERVICAL SPONDYLOSIS  PROCEDURE:  Procedure(s): ANTERIOR CERVICAL DECOMPRESSION/DISCECTOMY FUSION 1 LEVEL (ACDF C5-6) (N/A)  SURGEON:  Surgeon(s) and Role:    * Venita Lick, MD - Primary  PHYSICIAN ASSISTANT:   ASSISTANTS: none   ANESTHESIA:   none  EBL:  Total I/O In: 1000 [I.V.:1000] Out: -   BLOOD ADMINISTERED:none  DRAINS: none   LOCAL MEDICATIONS USED:  MARCAINE     SPECIMEN:  No Specimen  DISPOSITION OF SPECIMEN:  N/A  COUNTS:  YES  TOURNIQUET:  * No tourniquets in log *  DICTATION: .Other Dictation: Dictation Number [id number]  PLAN OF CARE: Admit for overnight observation  PATIENT DISPOSITION:  PACU - hemodynamically stable.

## 2012-08-25 NOTE — Plan of Care (Signed)
Problem: Consults Goal: Diagnosis - Spinal Surgery Cervical Spine Fusion ACDF C5-C6

## 2012-08-25 NOTE — Anesthesia Procedure Notes (Signed)
Procedure Name: Intubation Date/Time: 08/25/2012 2:02 PM Performed by: Gayla Medicus Pre-anesthesia Checklist: Patient identified, Timeout performed, Emergency Drugs available, Suction available and Patient being monitored Patient Re-evaluated:Patient Re-evaluated prior to inductionOxygen Delivery Method: Circle system utilized Preoxygenation: Pre-oxygenation with 100% oxygen Intubation Type: IV induction Ventilation: Mask ventilation without difficulty and Oral airway inserted - appropriate to patient size Laryngoscope Size: Mac and 4 Grade View: Grade II Tube type: Oral Tube size: 7.5 mm Number of attempts: 1 Airway Equipment and Method: Stylet and LTA kit utilized Placement Confirmation: ETT inserted through vocal cords under direct vision,  positive ETCO2 and breath sounds checked- equal and bilateral Secured at: 22 cm Tube secured with: Tape Dental Injury: Teeth and Oropharynx as per pre-operative assessment

## 2012-08-25 NOTE — Preoperative (Signed)
Beta Blockers   Reason not to administer Beta Blockers: not prescribed 

## 2012-08-25 NOTE — Transfer of Care (Signed)
Immediate Anesthesia Transfer of Care Note  Patient: Chad Guerra  Procedure(s) Performed: Procedure(s): ANTERIOR CERVICAL DECOMPRESSION/DISCECTOMY FUSION 1 LEVEL (ACDF C5-6) (N/A)  Patient Location: PACU  Anesthesia Type:General  Level of Consciousness: awake, alert  and oriented  Airway & Oxygen Therapy: Patient Spontanous Breathing and Patient connected to nasal cannula oxygen  Post-op Assessment: Report given to PACU RN and Post -op Vital signs reviewed and stable  Post vital signs: Reviewed and stable  Complications: No apparent anesthesia complications

## 2012-08-25 NOTE — Anesthesia Preprocedure Evaluation (Signed)
Anesthesia Evaluation   Patient awake    Reviewed: Allergy & Precautions, H&P , NPO status , Patient's Chart, lab work & pertinent test results  Airway Mallampati: II      Dental   Pulmonary  breath sounds clear to auscultation        Cardiovascular negative cardio ROS  Rhythm:Regular Rate:Normal     Neuro/Psych    GI/Hepatic negative GI ROS, Neg liver ROS,   Endo/Other  negative endocrine ROS  Renal/GU negative Renal ROS     Musculoskeletal   Abdominal   Peds  Hematology   Anesthesia Other Findings   Reproductive/Obstetrics                           Anesthesia Physical Anesthesia Plan  ASA: II  Anesthesia Plan: General   Post-op Pain Management:    Induction: Intravenous  Airway Management Planned: Oral ETT  Additional Equipment:   Intra-op Plan:   Post-operative Plan: Extubation in OR  Informed Consent: I have reviewed the patients History and Physical, chart, labs and discussed the procedure including the risks, benefits and alternatives for the proposed anesthesia with the patient or authorized representative who has indicated his/her understanding and acceptance.   Dental advisory given  Plan Discussed with: CRNA and Surgeon  Anesthesia Plan Comments:         Anesthesia Quick Evaluation

## 2012-08-25 NOTE — H&P (Signed)
History of Present Illness The patient is a 62 year old male who comes in today for a preoperative History and Physical. The patient is scheduled for a cervical ACDF C5-6 to be performed by Dr. Debria Garret D. Shon Baton, MD at [facility] on [phone] @130pm  . Please see the hospital record for complete dictated history and physical. He returns today for preop H&P. The last time I saw him was 04/11/2012. He continues to have significant right C6 radiculopathy. He continues to have severe right anterior shoulder pain as well. He has been in his sling for over a year.    Allergies Percocet *ANALGESICS - OPIOID*   Family History No pertinent family history   Social History Current work status. working part time Tobacco / smoke exposure. yes outdoors only Alcohol use. never consumed alcohol Children. 4 Drug/Alcohol Rehab (Currently). no Drug/Alcohol Rehab (Previously). no Exercise. Exercises weekly; does running / walking Exercises rarely; does running / walking Illicit drug use. no Living situation. live with spouse Marital status. married Number of flights of stairs before winded. 4-5 Pain Contract. no Tobacco use. never smoker   Medication History Robaxin (500MG  Tablet, Oral) Active. Norco (5-325MG  Tablet, Oral) Active. One-A-Day Mens (1 (one) Oral) Active. Naproxen DR (500MG  Tablet DR, Oral) Active. Ultram ( Oral) Specific dose unknown - Active.   Past Surgical History Arthroscopy of Shoulder. right Rotator Cuff Repair. right   Other Problems(Lori W Lamb; 08/23/2012 8:26 AM) No pertinent past medical history Unspecified Diagnosis   Vitals 08/23/2012 8:37 AM Weight: 230 lb Height: 70 in Body Surface Area: 2.27 m Body Mass Index: 33 kg/m Pulse: 75 (Regular) BP: 171/115 (Sitting, Left Arm, Standard)    Objective Transcription  He is a pleasant gentleman who appeared younger than his stated age. She is alert and  oriented times three. He has neck pain with palpation and range of motion. He has right shoulder stiffness and pain with internal and external rotation and abduction. Deltoid function does however appear to be intact. Biceps and wrist extensor on the right side show 4+/5 strength. He has decreased sensation and pain in the C6 distribution on the right. Left upper extremity is 5/5 throughout. No shortness of breath or chest pain. The abdomen is soft, nontender. He has normal gait pattern, no hip, knee or ankle pain with joint range of motion. Positive Lhermitte's sign.   Plans Transcription  At this point in time we have gone over his MRI which shows the hard disc osteophyte causing compression to the C6 nerve root. His clinical exam is consistent with C6 radiculopathy. Given the failure of conservative management and the longstanding issues he has been having I think it is reasonable to proceed with surgery. This would be a single level ACDF as we discussed in 04/12/23. The risks remain death, stroke, paralysis, failure to heal, need for further surgery, ongoing or worse pain, loss of bowel or bladder control, throat pain, swallowing difficulty, hoarseness in the voice, it does not fuse, need for further surgery at the levels above and below from break down and causing problems. At this point the goal of surgery is to reduce his pain and improve his quality of life. I am somewhat guarded with this because this has been going on for such a long time and his shoulder may also be causing part of his issue. After about three months of recovery if he is still having more anterior shoulder pain I will have him return to see Dr. Ranell Patrick to have  this reevaluated. The patient and his wife are present for the dictation. All of their questions were encouraged and addressed. We will be planning on surgery August 25, 2012.

## 2012-08-26 ENCOUNTER — Encounter (HOSPITAL_COMMUNITY): Payer: Self-pay | Admitting: General Practice

## 2012-08-26 DIAGNOSIS — M47812 Spondylosis without myelopathy or radiculopathy, cervical region: Secondary | ICD-10-CM | POA: Diagnosis present

## 2012-08-26 MED ORDER — METHOCARBAMOL 500 MG PO TABS: 500.0000 mg | ORAL_TABLET | Freq: Three times a day (TID) | ORAL | Status: DC | PRN

## 2012-08-26 MED ORDER — HYDROCODONE-ACETAMINOPHEN 10-325 MG PO TABS
1.0000 | ORAL_TABLET | ORAL | Status: DC | PRN
  Administered 2012-08-26: 1 via ORAL
  Filled 2012-08-26 (×3): qty 1

## 2012-08-26 MED ORDER — POLYETHYLENE GLYCOL 3350 17 GM/SCOOP PO POWD: 17.0000 g | Freq: Every day | ORAL | Status: DC

## 2012-08-26 MED ORDER — DOCUSATE SODIUM 100 MG PO CAPS: 100.0000 mg | ORAL_CAPSULE | Freq: Three times a day (TID) | ORAL | Status: DC | PRN

## 2012-08-26 MED ORDER — ONDANSETRON HCL 4 MG PO TABS: 4.0000 mg | ORAL_TABLET | Freq: Three times a day (TID) | ORAL | Status: DC | PRN

## 2012-08-26 MED ORDER — HYDROCODONE-ACETAMINOPHEN 10-325 MG PO TABS: 1.0000 | ORAL_TABLET | Freq: Four times a day (QID) | ORAL | Status: DC | PRN

## 2012-08-26 NOTE — Care Management Note (Signed)
CARE MANAGEMENT NOTE 08/26/2012  Patient:  Chad Guerra, Chad Guerra   Account Number:  [id number]  Date Initiated:  08/26/2012  Documentation initiated by:  Vance Peper  Subjective/Objective Assessment:   62 yr old male s/p cervical ACDF     Action/Plan:   No home health needs identified   Anticipated DC Date:  08/27/2012   Anticipated DC Plan:  HOME/SELF CARE      DC Planning Services  CM consult      Choice offered to / List presented to:             Status of service:  Completed, signed off Medicare Important Message given?   (If response is "NO", the following Medicare IM given date fields will be blank) Date Medicare IM given:   Date Additional Medicare IM given:    Discharge Disposition:  HOME/SELF CARE  Per UR Regulation:    If discussed at Long Length of Stay Meetings, dates discussed:    Comments:

## 2012-08-26 NOTE — Op Note (Signed)
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  OPERATIVE PROCEDURE:  Anterior cervical diskectomy and fusion, C5-6.  COMPLICATIONS:  None.  CONDITION:  Stable.  INTRAOPERATIVE FINDINGS:  Obvious deformity of the C5 vertebral body. Significant anterior osteophyte, which was resected.  No intraoperative complications.  INSTRUMENTATION SYSTEM USED:  DePuy SKYLINE anterior cervical plate 14 mm with 12 mm self-tapping locking screws into the body of C5 and one 12 rescue and 1 self-drilling 12 into the body of C6.  COMPLICATIONS:  None.  CONDITION:  Stable.  HISTORY:  This is a very pleasant gentleman who has been having significant neck and radicular arm pain in the C6 distribution despite appropriate conservative management.  His overall quality of life continues to deteriorate and his pain level did not improve.  As a result, we elected to proceed with surgery.  All appropriate risks, benefits, and alternatives were discussed with the patient and consent was obtained.  OPERATIVE NOTE:  The patient was brought to the operating room, placed supine on the operating table.  After successful induction of general anesthesia and endotracheal intubation, TEDs, SCDs were applied.  Rolled towels were placed between the shoulder blades and the arms were secured with wrist restraints for traction for intraoperative x-ray.  The anterior cervical spine was then prepped and draped in a standard fashion.  Time-out was then done to confirm patient,  procedure, and all other pertinent important data.  Once this was completed, I then took an intraoperative x-ray to confirm the C5-6 level.  Once I identified the incision site, I infiltrated with 0.25% Marcaine.  A transverse incision was made below the thyroid cartilage just at the level of the C5-6 disk space.  Sharp dissection was carried out down to and through the platysma.  This was a standard Bernis Schreur approach to the anterior cervical spine.  I then developed a plane on the medial side of the sternocleidomastoid and bluntly dissected through the deep cervical and prevertebral fascia.  I identified the carotid sheath on the lateral side and protected with a finger, mobilized the trachea and esophagus to expose the anterior longitudinal ligament.  Retractor blade was placed and then I mobilized the remaining prevertebral fascia to expose the large anterior exostosis at C5-6 level.  At this point, I took an intraoperative x-ray to confirm that this was in fact the C5-6 level.  Once this was done, I used a double-action Leksell rongeur to remove the large osteophyte.  Once this was done, I was able to actually see the disk space itself.  I placed a Caspar self- retaining retractors underneath the longus colli muscles and then deflated endotracheal cuff, expanded the retractors to the appropriate width and reinflated the endotracheal cuff.  I incised the annulus with  a 15-blade scalpel, then used a combination of pituitary rongeurs to begin my diskectomy.  I then used a 1 mm and 2 mm Kerrison to begin resecting the overhanging osteophyte from the C5 vertebral body.  Once this was done, I was able to have a better appreciation of the disk space.  I then placed distraction pins into the bodies of C5 and C6 and then gently distracted the space.  This allowed me to resect all of the disk material.  Using curette, pituitary rongeurs, I removed all the disk material until I could  visualize the posterior annulus.  Using a micro curette, I released the annulus from the posterior aspects of C5 and C6 vertebral body.  I then used a 1 mm Kerrison to resect the overhanging osteophyte.  I then went underneath the uncovertebral joint as best as I could and as safely as I could to remove the osteophytes from the foramen.  At this point, I then felt as though I had adequate decompression.  All the osteophytes had been significantly reduced. There was restoration of the intervertebral height and the uncovertebral joints were decompressed.  I then rasped the endplates so I had bleeding subchondral bone.  I then used sequential trials to determine the best size.  I place an 8 medium Titan titanium intervertebral cage, packed with DBX mix.  This was malleted down, and had an excellent fit.  I then applied the 14 mm plate.  Because the anterior deformity, the plate was right at the endplate and it was still close to the C4-5 disk space.  At this point, there was noting more I could do, this was the most anterior I could place the plate.  I did get excellent purchase with a self- tapping screws into the body of C5.  I got good purchase with the C6 screws.  All screws were torqued there.  I then torqued them appropriately according to the manufacturer standard.  I then irrigated the wound copiously with normal saline, removed the retractors, and then checked the esophagus to ensure it was not entrapped beneath the plate. Hemostasis was obtained using bipolar electrocautery and Floseal.  I then returned the trachea and esophagus to midline.  I then closed the platysma with interrupted 2-0 Vicryl sutures, and skin with 3-0 Monocryl.  Steri-Strips and dry dressing were applied.  The patient was extubated, transferred to PACU without incident.  At the end of the case, all needle and sponge counts were correct.  There was no adverse intraoperative events.     Alvy Beal,  MD     DDB/MEDQ  D:  08/25/2012  T:  08/26/2012  Job:  [id number]

## 2012-08-26 NOTE — Evaluation (Signed)
Occupational Therapy Evaluation Patient Details Name: Chad Guerra MRN: [id number] DOB: 1951-05-08 Today's Date: 08/26/2012 Time: 1478-2956 OT Time Calculation (min): 22 min  OT Assessment / Plan / Recommendation Clinical Impression   This 62 y.o. Male admitted for anterior cervical decompression/discectomy and fusion C5-6.  Pt is set up assist with BADLs.  All education completed.  No further OT needs identified.    OT Assessment  Patient does not need any further OT services    Follow Up Recommendations  No OT follow up;Supervision - Intermittent    Barriers to Discharge      Equipment Recommendations  None recommended by OT    Recommendations for Other Services    Frequency       Precautions / Restrictions Precautions Precautions: Cervical Required Braces or Orthoses: Cervical Brace Cervical Brace: Hard collar;Other (comment) (Aspen collar; on exccept when sleeping or eating) Restrictions Weight Bearing Restrictions: No       ADL  Eating/Feeding: Modified independent Where Assessed - Eating/Feeding: Bed level Grooming: Wash/dry hands;Wash/dry face;Teeth care;Brushing hair;Set up Where Assessed - Grooming: Unsupported standing Upper Body Bathing: Set up Where Assessed - Upper Body Bathing: Unsupported sitting Lower Body Bathing: Set up Where Assessed - Lower Body Bathing: Supported sit to stand Upper Body Dressing: Set up Where Assessed - Upper Body Dressing: Unsupported sitting Lower Body Dressing: Set up Where Assessed - Lower Body Dressing: Unsupported sit to stand Toilet Transfer: Modified independent Toilet Transfer Method: Sit to Barista: Comfort height toilet Toileting - Clothing Manipulation and Hygiene: Modified independent Where Assessed - Engineer, mining and Hygiene: Standing Equipment Used: Other (comment) (Aspen collar) Transfers/Ambulation Related to ADLs: Pt ambulating in room independently ADL Comments:  Pt able to don neck brace with supervision.  Pt initially donned it upside down, but was able to self correct.  Pt. instructed how to change out pads, and verbalized understanding.  Pt able to verbalize and maintain neck precautions.  Pt instructed in HEP for UE scapular strengthening as pt with Rt. Rotator cuff tear and demonstrates rounded shoulders/flexed posture.  Pt instructed in scap adduction and depression in standing with good postural alignment/control and he was able to return demonstration.  Pt cautioned against attempting to sit in tub to bathe due to excessive UE use to get into and out of tub.  Pt instructed to shower in standing instead.    OT Diagnosis:    OT Problem List:   OT Treatment Interventions:     OT Goals    Visit Information  Last OT Received On: 08/26/12 Assistance Needed: +1    Subjective Data  Subjective: "I've been with this pain for 14 mos" Patient Stated Goal: To be pain free   Prior Functioning     Home Living Lives With: Spouse;Family Available Help at Discharge: Family;Available 24 hours/day Type of Home: House Home Access: Stairs to enter [employer] of Steps: 1 Entrance Stairs-Rails: None Home Layout: Able to live on main level with bedroom/bathroom Bathroom Shower/Tub: Engineer, manufacturing systems: Standard Home Adaptive Equipment: None Prior Function Level of Independence: Independent Able to Take Stairs?: Yes Driving: Yes Communication Communication: No difficulties         Vision/Perception     Cognition  Cognition Overall Cognitive Status: Appears within functional limits for tasks assessed/performed Arousal/Alertness: Awake/alert Orientation Level: Appears intact for tasks assessed Behavior During Session: [facility] for tasks performed    Extremity/Trunk Assessment Right Upper Extremity Assessment RUE ROM/Strength/Tone: Deficits RUE ROM/Strength/Tone Deficits: Pt with  rotator cuff tear RUE Coordination: WFL -  gross/fine motor Left Upper Extremity Assessment LUE ROM/Strength/Tone: Within functional levels (within confines of precautions)     Mobility Bed Mobility Bed Mobility: Supine to Sit;Sitting - Scoot to Edge of Bed;Sit to Supine Supine to Sit: 6: Modified independent (Device/Increase time);With rails Sitting - Scoot to Edge of Bed: 6: Modified independent (Device/Increase time) Sit to Supine: 6: Modified independent (Device/Increase time);HOB flat Details for Bed Mobility Assistance: Pt demonstrates safe technique Transfers Transfers: Sit to Stand;Stand to Sit Sit to Stand: 6: Modified independent (Device/Increase time);With upper extremity assist;From bed Stand to Sit: 6: Modified independent (Device/Increase time);With upper extremity assist;To bed     Exercise     Balance     End of Session OT - End of Session Equipment Utilized During Treatment: Cervical collar Activity Tolerance: Patient tolerated treatment well Patient left: in bed;with call bell/phone within reach Nurse Communication: Patient requests pain meds  GO Functional Limitation: Self care Self Care Current Status (W0981): At least 1 percent but less than 20 percent impaired, limited or restricted Self Care Goal Status (X9147): At least 1 percent but less than 20 percent impaired, limited or restricted Self Care Discharge Status [phone]): At least 1 percent but less than 20 percent impaired, limited or restricted   Kailene Steinhart M 08/26/2012, 1:24 PM

## 2012-08-26 NOTE — Progress Notes (Signed)
Utilization review completed. Justice Milliron, RN, BSN. 

## 2012-08-26 NOTE — Progress Notes (Signed)
    Subjective: Procedure(s) (LRB): ANTERIOR CERVICAL DECOMPRESSION/DISCECTOMY FUSION 1 LEVEL (ACDF C5-6) (N/A) 1 Day Post-Op  Patient reports pain as 4 on 0-10 scale.  Reports decreased arm pain reports incisional neck pain   Positive void Negative bowel movement Positive flatus Negative chest pain or shortness of breath  Objective: Vital signs in last 24 hours: Temp:  [97.4 F (36.3 C)-98.2 F (36.8 C)] 98.2 F (36.8 C) (04/03 0601) Pulse Rate:  [57-100] 94 (04/03 0601) Resp:  [8-22] 16 (04/03 0601) BP: (129-151)/(81-91) 129/84 mmHg (04/03 0601) SpO2:  [95 %-100 %] 95 % (04/03 0601)  Intake/Output from previous day: 04/02 0701 - 04/03 0700 In: 1100 [I.V.:1100] Out: 500 [Urine:500]  Labs:  Recent Labs  08/23/12 1147  WBC 3.5*  RBC 5.04  HCT 43.7  PLT 206    Recent Labs  08/23/12 1147  NA 139  K 3.7  CL 103  CO2 27  BUN 11  CREATININE 1.06  GLUCOSE 93  CALCIUM 9.0   No results found for this basename: LABPT, INR,  in the last 72 hours  Physical Exam: Neurologically intact ABD soft Neurovascular intact Intact pulses distally Incision: dressing C/D/I and no drainage Compartment soft  Assessment/Plan: Patient stable  xrays satisfactory Mobilization with physical therapy Encourage incentive spirometry Continue care  Advance diet Up with therapy Plan on possible d/c today.  Change pain meds for better control.  Patient has taken norco in the past will send on that as well as robaxin  Venita Lick, MD [facility] [phone]

## 2012-08-26 NOTE — Evaluation (Signed)
Physical Therapy Evaluation Patient Details Name: Chad Guerra MRN: [id number] DOB: Sep 24, 1950 Today's Date: 08/26/2012 Time: 8119-1478 PT Time Calculation (min): 17 min  PT Assessment / Plan / Recommendation Clinical Impression  Pt is a pleasant 62 y.o. male s/p anterior cervical decompression and discectomy. Pt reported pain was well controlled; hopes to D/C home this morning with wife and family. Has 3 adult children and wife for 24/7 care upon D/C. Pt is moving well; required IV pole to steady himself; says he is unsteady because of the medication; denies need for [facility] or RW. Recommending OPPT upon acute D/C when medically cleared. Will cont to f/u with pt to increase mobility while in acute stay.    PT Assessment  Patient needs continued PT services    Follow Up Recommendations  Outpatient PT;Supervision/Assistance - 24 hour    Does the patient have the potential to tolerate intense rehabilitation      Barriers to Discharge        Equipment Recommendations  None recommended by PT    Recommendations for Other Services OT consult   Frequency Min 5X/week    Precautions / Restrictions Precautions Precautions: Cervical Required Braces or Orthoses: Cervical Brace Cervical Brace: Hard collar;Other (comment) (Aspen collar; on exccept when sleeping or eating) Restrictions Weight Bearing Restrictions: No   Pertinent Vitals/Pain Denies pain. No new complaints.      Mobility  Bed Mobility Bed Mobility: Not assessed Transfers Transfers: Sit to Stand;Stand to Sit Sit to Stand: 5: Supervision;With armrests;With upper extremity assist;From chair/3-in-1 Stand to Sit: 5: Supervision;With upper extremity assist;With armrests;To chair/3-in-1 Details for Transfer Assistance: required cues for hand placement for safety Ambulation/Gait Ambulation/Gait Assistance: 5: Supervision Ambulation Distance (Feet): 250 Feet Assistive device: Other (Comment) (iv pole) Ambulation/Gait  Assistance Details: pt stated he felt unsteady secondary to medication; requiring IV pole assistance to steady. Denies need for cane or RW Gait Pattern: Step-through pattern;Decreased stride length Gait velocity: decreased Stairs: Yes Stairs Assistance: 4: Min guard Stair Management Technique: No rails;Forwards;Alternating pattern Number of Stairs: 3 Wheelchair Mobility Wheelchair Mobility: No    Exercises     PT Diagnosis: Difficulty walking;Acute pain  PT Problem List: Decreased balance;Decreased activity tolerance;Decreased range of motion;Decreased mobility;Decreased safety awareness;Pain;Decreased knowledge of precautions PT Treatment Interventions: DME instruction;Gait training;Functional mobility training;Stair training;Therapeutic activities;Balance training;Cognitive remediation;Patient/family education   PT Goals Acute Rehab PT Goals PT Goal Formulation: With patient Time For Goal Achievement: 09/02/12 Potential to Achieve Goals: Good Pt will go Stand to Sit: with modified independence PT Goal: Stand to Sit - Progress: Goal set today Pt will Transfer Bed to Chair/Chair to Bed: with modified independence PT Transfer Goal: Bed to Chair/Chair to Bed - Progress: Goal set today Pt will Ambulate: >150 feet;with modified independence;with least restrictive assistive device PT Goal: Ambulate - Progress: Goal set today Pt will Go Up / Down Stairs: 1-2 stairs;with modified independence;with least restrictive assistive device PT Goal: Up/Down Stairs - Progress: Goal set today  Visit Information  Last PT Received On: 08/26/12 Assistance Needed: +1    Subjective Data  Subjective: When can i go home? Patient Stated Goal: return home   Prior Functioning  Home Living Lives With: Spouse;Family Available Help at Discharge: Family;Available 24 hours/day Type of Home: House Home Access: Stairs to enter [employer] of Steps: 1 Entrance Stairs-Rails: None Home Layout:  Able to live on main level with bedroom/bathroom Bathroom Shower/Tub: Engineer, manufacturing systems: Standard Home Adaptive Equipment: None Prior Function Level of Independence: Independent Able to  Take Stairs?: Yes Driving: Yes Communication Communication: No difficulties    Cognition  Cognition Overall Cognitive Status: Appears within functional limits for tasks assessed/performed Arousal/Alertness: Awake/alert Orientation Level: Appears intact for tasks assessed Behavior During Session: [facility] for tasks performed    Extremity/Trunk Assessment Right Lower Extremity Assessment RLE ROM/Strength/Tone: Within functional levels RLE Sensation: WFL - Light Touch Left Lower Extremity Assessment LLE ROM/Strength/Tone: Within functional levels LLE Sensation: WFL - Light Touch Trunk Assessment Trunk Assessment: Normal   Balance Balance Balance Assessed: No  End of Session PT - End of Session Equipment Utilized During Treatment: Gait belt;Cervical collar Activity Tolerance: Patient tolerated treatment well Patient left: in chair;with call bell/phone within reach;with nursing in room;with family/visitor present Nurse Communication: Mobility status  GP Functional Assessment Tool Used: clinical judgement Functional Limitation: Mobility: Walking and moving around Mobility: Walking and Moving Around Current Status (W0981): At least 1 percent but less than 20 percent impaired, limited or restricted Mobility: Walking and Moving Around Goal Status [phone]): 0 percent impaired, limited or restricted   Donell Sievert, [place] [phone] 08/26/2012, 9:16 AM

## 2012-08-26 NOTE — Anesthesia Postprocedure Evaluation (Signed)
  Anesthesia Post-op Note  Patient: Chad Guerra  Procedure(s) Performed: Procedure(s): ANTERIOR CERVICAL DECOMPRESSION/DISCECTOMY FUSION 1 LEVEL (ACDF C5-6) (N/A)  Patient Location: Nursing Unit  Anesthesia Type:General  Level of Consciousness: awake, alert , oriented and patient cooperative  Airway and Oxygen Therapy: Patient Spontanous Breathing  Post-op Pain: none  Post-op Assessment: Post-op Vital signs reviewed, Patient's Cardiovascular Status Stable, Respiratory Function Stable, Patent Airway, No signs of Nausea or vomiting, Adequate PO intake and Pain level controlled  Post-op Vital Signs: Reviewed and stable  Complications: No apparent anesthesia complications

## 2012-08-27 ENCOUNTER — Encounter (HOSPITAL_COMMUNITY): Payer: Self-pay | Admitting: Orthopedic Surgery

## 2012-08-27 NOTE — Discharge Summary (Signed)
Patient ID: Chad Guerra MRN: [id number] DOB/AGE: 1950-08-10 62 y.o.  Admit date: 08/25/2012 Discharge date: 08/27/2012  Admission Diagnoses:  Active Problems:   * No active hospital problems. *   Discharge Diagnoses:  Active Problems:   * No active hospital problems. *  status post Procedure(s): ANTERIOR CERVICAL DECOMPRESSION/DISCECTOMY FUSION 1 LEVEL (ACDF C5-6)  Past Medical History  Diagnosis Date  . Medical history non-contributory     Surgeries: Procedure(s): ANTERIOR CERVICAL DECOMPRESSION/DISCECTOMY FUSION 1 LEVEL (ACDF C5-6) on 08/25/2012   Consultants:  none  Discharged Condition: Improved  Hospital Course: Chad Guerra is an 62 y.o. male who was admitted 08/25/2012 for operative treatment of Cervical spondylosis without myelopathy. Patient failed conservative treatments (please see the history and physical for the specifics) and had severe unremitting pain that affects sleep, daily activities and work/hobbies. After pre-op clearance, the patient was taken to the operating room on 08/25/2012 and underwent  Procedure(s): ANTERIOR CERVICAL DECOMPRESSION/DISCECTOMY FUSION 1 LEVEL (ACDF C5-6).    Patient was given perioperative antibiotics:  Anti-infectives   Start     Dose/Rate Route Frequency Ordered Stop   08/25/12 2100  ceFAZolin (ANCEF) IVPB 1 g/50 mL premix     1 g 100 mL/hr over 30 Minutes Intravenous Every 8 hours 08/25/12 1702 08/26/12 0621   08/24/12 2053  ceFAZolin (ANCEF) IVPB 2 g/50 mL premix     2 g 100 mL/hr over 30 Minutes Intravenous 30 min pre-op 08/24/12 2053 08/25/12 1355       Patient was given sequential compression devices and early ambulation to prevent DVT.   Patient benefited maximally from hospital stay and there were no complications. At the time of discharge, the patient was urinating/moving their bowels without difficulty, tolerating a regular diet, pain is controlled with oral pain medications and they have been cleared by  PT/OT.   Recent vital signs: No data found.    Recent laboratory studies: No results found for this basename: WBC, HGB, HCT, PLT, NA, K, CL, CO2, BUN, CREATININE, GLUCOSE, PT, INR, CALCIUM, 2,  in the last 72 hours   Discharge Medications:     Medication List    STOP taking these medications       traMADol 50 MG tablet  Commonly known as:  ULTRAM      TAKE these medications       docusate sodium 100 MG capsule  Commonly known as:  COLACE  Take 1 capsule (100 mg total) by mouth 3 (three) times daily as needed for constipation.     HYDROcodone-acetaminophen 10-325 MG per tablet  Commonly known as:  NORCO  Take 1 tablet by mouth every 6 (six) hours as needed for pain.     methocarbamol 500 MG tablet  Commonly known as:  ROBAXIN  Take 1 tablet (500 mg total) by mouth 3 (three) times daily as needed.     multivitamin with minerals Tabs  Take 1 tablet by mouth daily.     ondansetron 4 MG tablet  Commonly known as:  ZOFRAN  Take 1 tablet (4 mg total) by mouth every 8 (eight) hours as needed for nausea.     polyethylene glycol powder powder  Commonly known as:  GLYCOLAX  Take 17 g by mouth daily.        Diagnostic Studies: Dg Cervical Spine 2 Or 3 Views  08/25/2012  *RADIOLOGY REPORT*  Clinical Data: Cervical fusion.  CERVICAL SPINE - 2-3 VIEW  Comparison: Intraoperative films, same date.  Findings:  Anterior plate and screws and interbody fusion cage are noted at C5-6.  No complicating features are demonstrated.  The alignment is grossly normal.  IMPRESSION: C5-6 fusion.   Original Report Authenticated By: Rudie Meyer, M.D.    Dg Cervical Spine 2-3 Views  08/25/2012  *RADIOLOGY REPORT*  Clinical Data: Anterior cervical fusion  CERVICAL SPINE - 2-3 VIEW,DG C-ARM 1-60 MIN  Comparison: Earlier films of the same day  Findings: Two intraoperative fluoroscopic spot images document changes of instrumented ACDF C5-6, hardware projecting in expected location.  Cage projects in the  interspace.  Alignment is preserved.  The cervicothoracic junction is not visualized.  IMPRESSION:  1.  ACDF C5-6   Original Report Authenticated By: D. Andria Rhein, MD    Dg Cervical Spine 2 Or 3 Views  08/25/2012  *RADIOLOGY REPORT*  Clinical Data: 62 year old male preoperative study for cervical spine surgery.  CERVICAL SPINE - 2-3 VIEW  Comparison: None.  Findings: Mild straightening of cervical lordosis.  Prevertebral soft tissue contour within normal limits.  Moderate to severe disc space loss and endplate spurring at C5-C6. Cervicothoracic junction alignment is within normal limits.  Lung apices are clear.  AP cervical spine alignment within normal limits.  IMPRESSION: Chronic C5-C6 disc and endplate degeneration. The cervical spine levels on these images were numbered in anticipation of surgery.   Original Report Authenticated By: Erskine Speed, M.D.    Dg C-arm 1-60 Min  08/25/2012  *RADIOLOGY REPORT*  Clinical Data: Anterior cervical fusion  CERVICAL SPINE - 2-3 VIEW,DG C-ARM 1-60 MIN  Comparison: Earlier films of the same day  Findings: Two intraoperative fluoroscopic spot images document changes of instrumented ACDF C5-6, hardware projecting in expected location.  Cage projects in the interspace.  Alignment is preserved.  The cervicothoracic junction is not visualized.  IMPRESSION:  1.  ACDF C5-6   Original Report Authenticated By: D. Andria Rhein, MD           Follow-up Information   Follow up with Alvy Beal, MD. Call in 2 weeks. (As needed)    Contact information:   [address] [place] [address] [phone]       Discharge Plan:  discharge to home  Disposition: stable   Signed: Venita Lick D for Dr. Venita Lick [facility] [phone] 08/27/2012, 7:11 AM

## 2013-04-06 DIAGNOSIS — D709 Neutropenia, unspecified: Secondary | ICD-10-CM | POA: Diagnosis present

## 2013-05-13 ENCOUNTER — Ambulatory Visit: Payer: Non-veteran care

## 2013-05-14 ENCOUNTER — Encounter (HOSPITAL_COMMUNITY): Payer: Self-pay | Admitting: Emergency Medicine

## 2013-05-14 ENCOUNTER — Emergency Department (HOSPITAL_COMMUNITY): Payer: Self-pay

## 2013-05-14 ENCOUNTER — Emergency Department (HOSPITAL_COMMUNITY)
Admission: EM | Admit: 2013-05-14 | Discharge: 2013-05-14 | Disposition: A | Discharge status: Home / Self Care | Payer: BC Managed Care – PPO | Attending: Emergency Medicine | Admitting: Emergency Medicine

## 2013-05-14 DIAGNOSIS — M109 Gout, unspecified: Secondary | ICD-10-CM

## 2013-05-14 DIAGNOSIS — M722 Plantar fascial fibromatosis: Secondary | ICD-10-CM

## 2013-05-14 DIAGNOSIS — M79671 Pain in right foot: Secondary | ICD-10-CM

## 2013-05-14 DIAGNOSIS — Z79899 Other long term (current) drug therapy: Secondary | ICD-10-CM | POA: Insufficient documentation

## 2013-05-14 DIAGNOSIS — M7989 Other specified soft tissue disorders: Secondary | ICD-10-CM

## 2013-05-14 DIAGNOSIS — M79609 Pain in unspecified limb: Secondary | ICD-10-CM

## 2013-05-14 MED ORDER — INDOMETHACIN 50 MG PO CAPS: 50.0000 mg | ORAL_CAPSULE | Freq: Three times a day (TID) | ORAL | Status: DC

## 2013-05-14 MED ORDER — HYDROCODONE-ACETAMINOPHEN 5-325 MG PO TABS
1.0000 | ORAL_TABLET | Freq: Once | ORAL | Status: AC
  Administered 2013-05-14: 1 via ORAL
  Filled 2013-05-14: qty 1

## 2013-05-14 NOTE — ED Provider Notes (Signed)
Medical screening examination/treatment/procedure(s) were performed by non-physician practitioner and as supervising physician I was immediately available for consultation/collaboration.  EKG Interpretation    Date/Time:  Saturday May 14 2013 09:44:47 EST Ventricular Rate:  64 PR Interval:  143 QRS Duration: 66 QT Interval:  400 QTC Calculation: 413 R Axis:   35 Text Interpretation:  Sinus rhythm Low voltage, precordial leads Abnormal R-wave progression, early transition Borderline T wave abnormalities, slightly increased from prior tracing Confirmed by Storey Stangeland  MD-J, [place]esta ([id number]) on 05/14/2013 10:31:03 AM            EKG was performed at triage, not clinically indicated.   Celene Kras, MD 05/14/13 [phone]

## 2013-05-14 NOTE — ED Provider Notes (Signed)
CSN: [id number]     Arrival date & time 05/14/13  5621 History   First MD Initiated Contact with Patient 05/14/13 0935     Chief Complaint  Patient presents with  . Foot Pain   (Consider location/radiation/quality/duration/timing/severity/associated sxs/prior Treatment) The history is provided by the patient. No language interpreter was used.  Chad Guerra is a 62 y/o M with no significant PMHx presenting to the ED with right foot and ankle pain that has been ongoing for approximately one week. Patient reported that the pain started in the medical aspect of the right ankle and worked its way down to the plantar aspect of the right foot. Patient reported that the ankle pain is a burning, scratching pain that is constant with the plantar aspect of his right foot felling as if there are knots in there. Patient reported that the pain is worse when walking, reported that he is unable to apply pressure to the foot without intense pain. Denied history of gout, DM. Denied fall, injuries, chills, fever, numbness, tingling, chest pain, shortness of breath, difficulty breathing, calf pain.  PCP none   Past Medical History  Diagnosis Date  . Medical history non-contributory    Past Surgical History  Procedure Laterality Date  . Hernia repair    . Rotator cuff repair    . Cholecystectomy N/A 07/04/2012    Procedure: LAPAROSCOPIC CHOLECYSTECTOMY WITH INTRAOPERATIVE CHOLANGIOGRAM;  Surgeon: Valarie Merino, MD;  Location: [facility] ORS;  Service: General;  Laterality: N/A;  . Knee arthroscopy w/ acl reconstruction      LEFT  . Anterior fusion cervical spine  08/25/2012    Dr Shon Baton  . Anterior cervical decomp/discectomy fusion N/A 08/25/2012    Procedure: ANTERIOR CERVICAL DECOMPRESSION/DISCECTOMY FUSION 1 LEVEL (ACDF C5-6);  Surgeon: Venita Lick, MD;  Location: [facility] OR;  Service: Orthopedics;  Laterality: N/A;   No family history on file. History  Substance Use Topics  . Smoking status: Never Smoker    . Smokeless tobacco: Never Used  . Alcohol Use: No    Review of Systems  Constitutional: Negative for fever and chills.  Musculoskeletal: Positive for arthralgias (right ankle and foot) and joint swelling.  Skin: Negative for color change and rash.  Neurological: Negative for numbness.  All other systems reviewed and are negative.    Allergies  Percocet  Home Medications   Current Outpatient Rx  Name  Route  Sig  Dispense  Refill  . cholecalciferol (VITAMIN D) 1000 UNITS tablet   Oral   Take 1,000 Units by mouth daily.         Marland Kitchen HYDROcodone-acetaminophen (NORCO) 10-325 MG per tablet   Oral   Take 1 tablet by mouth every 6 (six) hours as needed for pain.   60 tablet   0   . methocarbamol (ROBAXIN) 500 MG tablet   Oral   Take 1 tablet (500 mg total) by mouth 3 (three) times daily as needed.   60 tablet   0   . Multiple Vitamin (MULTIVITAMIN WITH MINERALS) TABS   Oral   Take 1 tablet by mouth daily.         . indomethacin (INDOCIN) 50 MG capsule   Oral   Take 1 capsule (50 mg total) by mouth 3 (three) times daily with meals.   10 capsule   0    BP 136/93  Pulse 74  Temp(Src) 98.7 F (37.1 C) (Oral)  Resp 16  SpO2 98% Physical Exam  Nursing note and vitals  reviewed. Constitutional: He is oriented to person, place, and time. He appears well-developed and well-nourished. No distress.  HENT:  Head: Normocephalic and atraumatic.  Neck: Normal range of motion. Neck supple.  Cardiovascular: Normal rate, regular rhythm and normal heart sounds.  Exam reveals no friction rub.   No murmur heard. Pulses:      Radial pulses are 2+ on the right side, and 2+ on the left side.       Dorsalis pedis pulses are 2+ on the right side, and 2+ on the left side.  Negative pain upon palpation to the right calf Negative edema noted to the leg - negative leg or calf swelling noted bilaterally Negative pitting edema noted  Pulmonary/Chest: Effort normal and breath sounds  normal. No respiratory distress. He has no wheezes. He has no rales.  Musculoskeletal: Normal range of motion. He exhibits edema and tenderness.  Swelling localized to the right ankle circumferential. Discomfort upon palpation to the right ankle circumferentially and plantar aspect of the right foot. Negative warmth upon palpation, negative erythema, negative inflammation, negative lesions, negative sores, negative ulcers noted to the skin. Negative skin color changes. Pain upon palpation to the right calcaneous. Full ROM to the right ankle - inversion, eversion, dorsiflexion, plantar flexion with pain noted. Full ROM to the digits of the right foot.   Neurological: He is alert and oriented to person, place, and time. He exhibits normal muscle tone. Coordination normal.  Strength intact to the digits of the right foot Strength 5+/5+ to BLE with resistance applied, equal distribution noted Sensation intact   Skin: Skin is warm and dry. He is not diaphoretic. No erythema.  Psychiatric: He has a normal mood and affect. His behavior is normal. Thought content normal.    ED Course  Procedures (including critical care time)  This provider discussed case with Dr. Roselyn Bering who recommended to treat patient for gout - recommended indomethacin to be discharged with.   Dg Ankle Complete Right  05/14/2013   CLINICAL DATA:  Foot and ankle pain, swelling.  No known injury.  EXAM: RIGHT ANKLE - COMPLETE 3+ VIEW  COMPARISON:  None.  FINDINGS: No acute bony abnormality. Specifically, no fracture, subluxation, or dislocation. Soft tissues are intact. Vascular calcifications noted.  IMPRESSION: No acute bony abnormality.   Electronically Signed   By: Charlett Nose M.D.   On: 05/14/2013 11:12   Dg Foot 2 Views Right  05/14/2013   CLINICAL DATA:  Pain, swelling and right foot and ankle. No known injury.  EXAM: RIGHT FOOT - 2 VIEW  COMPARISON:  None.  FINDINGS: Degenerative changes/hallux valgus at the 1st MTP joint.  No acute bony abnormality. Specifically, no fracture, subluxation, or dislocation. Soft tissues are intact. Vascular calcifications noted.  IMPRESSION: No acute bony abnormality.   Electronically Signed   By: Charlett Nose M.D.   On: 05/14/2013 11:15    Labs Review Labs Reviewed - No data to display Imaging Review Dg Ankle Complete Right  05/14/2013   CLINICAL DATA:  Foot and ankle pain, swelling.  No known injury.  EXAM: RIGHT ANKLE - COMPLETE 3+ VIEW  COMPARISON:  None.  FINDINGS: No acute bony abnormality. Specifically, no fracture, subluxation, or dislocation. Soft tissues are intact. Vascular calcifications noted.  IMPRESSION: No acute bony abnormality.   Electronically Signed   By: Charlett Nose M.D.   On: 05/14/2013 11:12   Dg Foot 2 Views Right  05/14/2013   CLINICAL DATA:  Pain, swelling and right foot  and ankle. No known injury.  EXAM: RIGHT FOOT - 2 VIEW  COMPARISON:  None.  FINDINGS: Degenerative changes/hallux valgus at the 1st MTP joint. No acute bony abnormality. Specifically, no fracture, subluxation, or dislocation. Soft tissues are intact. Vascular calcifications noted.  IMPRESSION: No acute bony abnormality.   Electronically Signed   By: Charlett Nose M.D.   On: 05/14/2013 11:15    EKG Interpretation    Date/Time:  Saturday May 14 2013 09:44:47 EST Ventricular Rate:  64 PR Interval:  143 QRS Duration: 66 QT Interval:  400 QTC Calculation: 413 R Axis:   35 Text Interpretation:  Sinus rhythm Low voltage, precordial leads Abnormal R-wave progression, early transition Borderline T wave abnormalities, slightly increased from prior tracing Confirmed by KNAPP  MD-J, JON ([id number]) on 05/14/2013 10:31:03 AM            MDM   1. Plantar fasciitis of right foot   2. Gout   3. Foot pain, right     Filed Vitals:   05/14/13 0928  BP: 136/93  Pulse: 74  Temp: 98.7 F (37.1 C)  TempSrc: Oral  Resp: 16  SpO2: 98%   Patient presenting to the ED with right foot and  ankle pain that started 1 week ago. Patient reported right ankle swelling. Stated that the pain worsens with applying pressure to the foot.  Alert and oriented. GCS 15. Pulses palpable and strong, radial and DP 2+ bilaterally. Swelling localized to the right ankle circumferentially with pain upon palpation. Negative warmth, erythema, inflammation, lesions, sores, ulcers, streaking noted. Full ROM noted to the right ankle. Full ROM noted to the digits of the right foot. Discomfort upon palpation to the plantar aspect of the right foot. Strength intact with equal distribution. Strength intact to the digits of the right foot. Sensation intact. Negative pitting edema, negative calf pain or swelling noted.  Plain films negative for acute fractures or dislocations - vascular calcifications noted. Doppler negative for DVT.  Well's Criteria negative - doubt DVT. Doubt septic joint. Suspicion to be possible plantar fascitis, cannot rule out gout. Patient stable, afebrile. Patient placed in post-op shoe for comfort. Discharged patient with anti-inflammatories. Discussed with patient to rest, elevate, and stay hydrated. Referred patient to PCP and orthopedics. Discussed with patient to closely monitor symptoms and if symptoms are to worsen or change to report back to the ED - strict return instructions given.  Patient agreed to plan of care, understood, all questions answered.      Raymon Mutton, PA-C 05/14/13 1714

## 2013-05-14 NOTE — ED Notes (Signed)
Pt from home reports R foot and heel pain x1 week. Pt denies injury. Pt has swelling to R ankle, denies SOB, CP. Pt is A&O and in NAD

## 2013-05-14 NOTE — Progress Notes (Signed)
*  Preliminary Results* Right lower extremity venous duplex completed. Right lower extremity is negative for deep vein thrombosis. There is no evidence of right Baker's cyst.  05/14/2013 12:17 PM  Gertie Fey, RVT, RDCS, RDMS

## 2013-05-14 NOTE — ED Notes (Signed)
EKG was posted to wrong chart. This pt did not have EKG

## 2013-05-17 NOTE — ED Provider Notes (Signed)
Medical screening examination/treatment/procedure(s) were performed by non-physician practitioner and as supervising physician I was immediately available for consultation/collaboration.  EKG Interpretation    Date/Time:  Saturday May 14 2013 09:44:47 EST Ventricular Rate:  64 PR Interval:  143 QRS Duration: 66 QT Interval:  400 QTC Calculation: 413 R Axis:   35 Text Interpretation:  Sinus rhythm Low voltage, precordial leads Abnormal R-wave progression, early transition Borderline T wave abnormalities, slightly increased from prior tracing Confirmed by Aleece Loyd  MD-J, Kailo Kosik ([id number]) on 05/14/2013 10:31:03 AM              Celene Kras, MD 05/17/13 [phone]

## 2013-10-18 DIAGNOSIS — H348332 Tributary (branch) retinal vein occlusion, bilateral, stable: Secondary | ICD-10-CM | POA: Insufficient documentation

## 2013-11-03 ENCOUNTER — Ambulatory Visit: Payer: BC Managed Care – PPO | Attending: Internal Medicine

## 2014-01-27 ENCOUNTER — Emergency Department (INDEPENDENT_AMBULATORY_CARE_PROVIDER_SITE_OTHER)
Admission: EM | Admit: 2014-01-27 | Discharge: 2014-01-27 | Disposition: A | Discharge status: Nursing Home (Includes ICF) | Payer: Self-pay | Source: Home / Self Care | Attending: Family Medicine | Admitting: Family Medicine

## 2014-01-27 ENCOUNTER — Encounter (HOSPITAL_COMMUNITY): Payer: Self-pay | Admitting: Emergency Medicine

## 2014-01-27 ENCOUNTER — Emergency Department (HOSPITAL_COMMUNITY): Payer: Self-pay

## 2014-01-27 ENCOUNTER — Emergency Department (HOSPITAL_COMMUNITY)
Admission: EM | Admit: 2014-01-27 | Discharge: 2014-01-28 | Disposition: A | Discharge status: Home / Self Care | Payer: Self-pay | Attending: Emergency Medicine | Admitting: Emergency Medicine

## 2014-01-27 DIAGNOSIS — R1032 Left lower quadrant pain: Secondary | ICD-10-CM | POA: Insufficient documentation

## 2014-01-27 DIAGNOSIS — Z79899 Other long term (current) drug therapy: Secondary | ICD-10-CM | POA: Insufficient documentation

## 2014-01-27 DIAGNOSIS — I10 Essential (primary) hypertension: Secondary | ICD-10-CM | POA: Insufficient documentation

## 2014-01-27 DIAGNOSIS — E669 Obesity, unspecified: Secondary | ICD-10-CM | POA: Insufficient documentation

## 2014-01-27 DIAGNOSIS — Z9889 Other specified postprocedural states: Secondary | ICD-10-CM | POA: Insufficient documentation

## 2014-01-27 DIAGNOSIS — K5732 Diverticulitis of large intestine without perforation or abscess without bleeding: Secondary | ICD-10-CM | POA: Insufficient documentation

## 2014-01-27 DIAGNOSIS — Z9089 Acquired absence of other organs: Secondary | ICD-10-CM | POA: Insufficient documentation

## 2014-01-27 HISTORY — DX: Essential (primary) hypertension: I10

## 2014-01-27 LAB — URINALYSIS, ROUTINE W REFLEX MICROSCOPIC
Bilirubin Urine: NEGATIVE
GLUCOSE, UA: NEGATIVE mg/dL
HGB URINE DIPSTICK: NEGATIVE
Ketones, ur: NEGATIVE mg/dL
Leukocytes, UA: NEGATIVE
Nitrite: NEGATIVE
PROTEIN: NEGATIVE mg/dL
Specific Gravity, Urine: 1.02 (ref 1.005–1.030)
Urobilinogen, UA: 0.2 mg/dL (ref 0.0–1.0)
pH: 6 (ref 5.0–8.0)

## 2014-01-27 LAB — COMPREHENSIVE METABOLIC PANEL
ALK PHOS: 111 U/L (ref 39–117)
ALT: 35 U/L (ref 0–53)
AST: 24 U/L (ref 0–37)
Albumin: 4.1 g/dL (ref 3.5–5.2)
Anion gap: 14 (ref 5–15)
BUN: 13 mg/dL (ref 6–23)
CO2: 25 meq/L (ref 19–32)
Calcium: 9.3 mg/dL (ref 8.4–10.5)
Chloride: 99 mEq/L (ref 96–112)
Creatinine, Ser: 1.17 mg/dL (ref 0.50–1.35)
GFR, EST AFRICAN AMERICAN: 75 mL/min — AB (ref >90)
GFR, EST NON AFRICAN AMERICAN: 65 mL/min — AB (ref >90)
GLUCOSE: 101 mg/dL — AB (ref 70–99)
POTASSIUM: 3.9 meq/L (ref 3.7–5.3)
SODIUM: 138 meq/L (ref 137–147)
TOTAL PROTEIN: 8 g/dL (ref 6.0–8.3)
Total Bilirubin: 1.1 mg/dL (ref 0.3–1.2)

## 2014-01-27 LAB — CBC WITH DIFFERENTIAL/PLATELET
Basophils Absolute: 0 10*3/uL (ref 0.0–0.1)
Basophils Relative: 0 % (ref 0–1)
Eosinophils Absolute: 0 10*3/uL (ref 0.0–0.7)
Eosinophils Relative: 1 % (ref 0–5)
HCT: 46.9 % (ref 39.0–52.0)
Hemoglobin: 15.7 g/dL (ref 13.0–17.0)
LYMPHS ABS: 1 10*3/uL (ref 0.7–4.0)
LYMPHS PCT: 13 % (ref 12–46)
MCH: 30.3 pg (ref 26.0–34.0)
MCHC: 33.5 g/dL (ref 30.0–36.0)
MCV: 90.4 fL (ref 78.0–100.0)
Monocytes Absolute: 0.5 10*3/uL (ref 0.1–1.0)
Monocytes Relative: 7 % (ref 3–12)
NEUTROS PCT: 79 % — AB (ref 43–77)
Neutro Abs: 5.8 10*3/uL (ref 1.7–7.7)
PLATELETS: 195 10*3/uL (ref 150–400)
RBC: 5.19 MIL/uL (ref 4.22–5.81)
RDW: 12.7 % (ref 11.5–15.5)
WBC: 7.3 10*3/uL (ref 4.0–10.5)

## 2014-01-27 LAB — LIPASE, BLOOD: Lipase: 44 U/L (ref 11–59)

## 2014-01-27 MED ORDER — HYDROMORPHONE HCL PF 1 MG/ML IJ SOLN
1.0000 mg | Freq: Once | INTRAMUSCULAR | Status: AC
  Administered 2014-01-27: 1 mg via INTRAVENOUS
  Filled 2014-01-27: qty 1

## 2014-01-27 MED ORDER — IOHEXOL 300 MG/ML  SOLN
100.0000 mL | Freq: Once | INTRAMUSCULAR | Status: AC | PRN
Start: 2014-01-27 — End: 2014-01-27
  Administered 2014-01-27: 100 mL via INTRAVENOUS

## 2014-01-27 MED ORDER — CIPROFLOXACIN IN D5W 400 MG/200ML IV SOLN
400.0000 mg | Freq: Once | INTRAVENOUS | Status: AC
  Administered 2014-01-27: 400 mg via INTRAVENOUS
  Filled 2014-01-27: qty 200

## 2014-01-27 MED ORDER — DIPHENHYDRAMINE HCL 50 MG/ML IJ SOLN
25.0000 mg | Freq: Once | INTRAMUSCULAR | Status: AC
  Administered 2014-01-27: 25 mg via INTRAVENOUS
  Filled 2014-01-27: qty 1

## 2014-01-27 MED ORDER — METRONIDAZOLE IN NACL 5-0.79 MG/ML-% IV SOLN
500.0000 mg | Freq: Once | INTRAVENOUS | Status: AC
  Administered 2014-01-28: 500 mg via INTRAVENOUS
  Filled 2014-01-27: qty 100

## 2014-01-27 MED ORDER — OXYCODONE-ACETAMINOPHEN 5-325 MG PO TABS: 2.0000 | ORAL_TABLET | Freq: Once | ORAL | Status: DC

## 2014-01-27 NOTE — ED Notes (Signed)
Abdominal pain onset yesterday.  Reports 5-6 episodes of diarrhea yesterday.  Today did have a bm , but not much of a stool.

## 2014-01-27 NOTE — ED Notes (Signed)
Pt c/o LLQ pain and nausea x 1 day.  Pain score 9/10. Pt reports diarrhea yesterday, but has resolved.  Hx diverticulitis.  Pt was seen at [facility] and directed to come here.

## 2014-01-27 NOTE — ED Provider Notes (Signed)
CSN: [id number]     Arrival date & time 01/27/14  1829 History   First MD Initiated Contact with Patient 01/27/14 2049     Chief Complaint  Patient presents with  . Abdominal Pain  . Nausea     (Consider location/radiation/quality/duration/timing/severity/associated sxs/prior Treatment) HPI Pt is a 63yo male with hx of HTN and diverticulitis presenting to ED from urgent care for further evaluation and treatment of LLQ pain that started yesterday. Pain is constant, sharp, 10/10 at worst.  Associated with nausea and reports 5-6 episodes of diarrhea yesterday but only 1 BM today w/o much stool.  Denies blood or mucous in stool. Denies fever or chills. Pt also reports concern for what he believes is surgical mess under his umbilicus from hernia repair at the [place] in 2013. States he occasionally has pain and has been able to feel the mesh under his skin since surgery but states LLQ pain is new. Pt is unsure if pain is related to his diverticulitis or the mess. Denies urinary symptoms.   Past Medical History  Diagnosis Date  . Medical history non-contributory   . Hypertension    Past Surgical History  Procedure Laterality Date  . Hernia repair    . Rotator cuff repair    . Cholecystectomy N/A 07/04/2012    Procedure: LAPAROSCOPIC CHOLECYSTECTOMY WITH INTRAOPERATIVE CHOLANGIOGRAM;  Surgeon: Pedro Earls, MD;  Location: [facility];  Service: General;  Laterality: N/A;  . Knee arthroscopy w/ acl reconstruction      LEFT  . Anterior fusion cervical spine  08/25/2012    Dr Rolena Infante  . Anterior cervical decomp/discectomy fusion N/A 08/25/2012    Procedure: ANTERIOR CERVICAL DECOMPRESSION/DISCECTOMY FUSION 1 LEVEL (ACDF C5-6);  Surgeon: Melina Schools, MD;  Location: [place];  Service: Orthopedics;  Laterality: N/A;   History reviewed. No pertinent family history. History  Substance Use Topics  . Smoking status: Never Smoker   . Smokeless tobacco: Never Used  . Alcohol Use: No    Review of Systems   Constitutional: Negative for fever and chills.  Respiratory: Negative for cough and shortness of breath.   Gastrointestinal: Positive for nausea, abdominal pain and diarrhea ( 5-6 episodes yesterday). Negative for vomiting and constipation.  Genitourinary: Negative for dysuria, urgency, frequency, hematuria and decreased urine volume.  All other systems reviewed and are negative.     Allergies  Percocet  Home Medications   Prior to Admission medications   Medication Sig Start Date End Date Taking? Authorizing Provider  cholecalciferol (VITAMIN D) 1000 UNITS tablet Take 1,000 Units by mouth daily.   Yes Historical Provider, MD  HYDROcodone-acetaminophen (NORCO) 10-325 MG per tablet Take 1 tablet by mouth every 6 (six) hours as needed for moderate pain.   Yes Historical Provider, MD  ibuprofen (ADVIL,MOTRIN) 200 MG tablet Take 600 mg by mouth every 6 (six) hours as needed for moderate pain.   Yes Historical Provider, MD  lisinopril (PRINIVIL,ZESTRIL) 20 MG tablet Take 20 mg by mouth daily.   Yes Historical Provider, MD  Multiple Vitamin (MULTIVITAMIN WITH MINERALS) TABS Take 1 tablet by mouth daily.   Yes Historical Provider, MD  ciprofloxacin (CIPRO) 500 MG tablet Take 1 tablet (500 mg total) by mouth every 12 (twelve) hours. 01/28/14   Noland Fordyce, PA-C  HYDROcodone-acetaminophen (NORCO/VICODIN) 5-325 MG per tablet Take 1-2 tablets by mouth every 4 (four) hours as needed for moderate pain or severe pain. 01/28/14   Noland Fordyce, PA-C  metroNIDAZOLE (FLAGYL) 500 MG tablet Take 1 tablet (  500 mg total) by mouth 2 (two) times daily. 01/28/14   Noland Fordyce, PA-C  promethazine (PHENERGAN) 25 MG tablet Take 1 tablet (25 mg total) by mouth every 6 (six) hours as needed for nausea or vomiting. 01/28/14   Noland Fordyce, PA-C   BP 134/89  Pulse 81  Temp(Src) 98.7 F (37.1 C) (Oral)  Resp 16  SpO2 97% Physical Exam  Nursing note and vitals reviewed. Constitutional: He appears well-developed and  well-nourished.  Obese male lying on exam bed, appears mildly uncomfortable. Non-toxic appearing.  HENT:  Head: Normocephalic and atraumatic.  Eyes: Conjunctivae are normal. No scleral icterus.  Neck: Normal range of motion.  Cardiovascular: Normal rate, regular rhythm and normal heart sounds.   Pulmonary/Chest: Effort normal and breath sounds normal. No respiratory distress. He has no wheezes. He has no rales. He exhibits no tenderness.  Abdominal: Soft. Bowel sounds are normal. He exhibits no distension and no mass. There is tenderness. There is no rebound.  Soft, non-distended, tenderness in LLQ.  Well healed surgical scar from umbilical hernia repair.  Palpable hard mass (surgical mess? chronic) in umbilical region.  No erythema or warmth.  Musculoskeletal: Normal range of motion.  Neurological: He is alert.  Skin: Skin is warm and dry.    ED Course  Procedures (including critical care time) Labs Review Labs Reviewed  CBC WITH DIFFERENTIAL - Abnormal; Notable for the following:    Neutrophils Relative % 79 (*)    All other components within normal limits  COMPREHENSIVE METABOLIC PANEL - Abnormal; Notable for the following:    Glucose, Bld 101 (*)    GFR calc non Af Amer 65 (*)    GFR calc Af Amer 75 (*)    All other components within normal limits  LIPASE, BLOOD  URINALYSIS, ROUTINE W REFLEX MICROSCOPIC    Imaging Review Ct Abdomen Pelvis W Contrast  01/27/2014   CLINICAL DATA:  Left lower quadrant pain.  Nausea.  Diverticulosis.  EXAM: CT ABDOMEN AND PELVIS WITH CONTRAST  TECHNIQUE: Multidetector CT imaging of the abdomen and pelvis was performed using the standard protocol following bolus administration of intravenous contrast.  CONTRAST:  131mL OMNIPAQUE IOHEXOL 300 MG/ML  SOLN  COMPARISON:  10/05/2011  FINDINGS: Liver:  No mass or other parenchymal abnormality identified.  Gallbladder/Biliary: Prior cholecystectomy noted. No evidence of biliary dilatation.  Pancreas: No mass,  inflammatory changes, or other parenchymal abnormality identified.  Spleen:  Within normal limits in size and appearance.  Adrenal Glands:  No mass identified.  Kidneys/Urinary Tract: No masses identified. No evidence of hydronephrosis.  Lymph Nodes: No pathologically enlarged lymph nodes identified. Small fluid collection adjacent to the right external iliac vessels is stable and likely due to a a small lymphocele.  Pelvic/Reproductive Organs: Mildly enlarged prostate seen. Mild diffuse bladder wall thickening is also suspicious for chronic bladder outlet obstruction.  Bowel/Peritoneum: Moderate sigmoid diverticulitis is demonstrated. No evidence of abscess or bowel obstruction. Normal appendix visualized.  Vascular:  No evidence of abdominal aortic aneurysm.  Musculoskeletal:  No suspicious bone lesions identified.  Other:  None.  IMPRESSION: Moderate sigmoid diverticulitis. No evidence of abscess or other complication.  Mildly enlarged prostate and findings suspicious for chronic bladder outlet obstruction.   Electronically Signed   By: Earle Gell M.D.   On: 01/27/2014 22:49     EKG Interpretation None      MDM   Final diagnoses:  Diverticulitis of sigmoid colon    Pt is a 63yo male sent to  ED by urgent care for further evaluation of LLQ pain, concern for diverticulitis.  Pt also has hx of umbilical hernia repair with mesh. Pt concerned mesh is causing increased pain.  Pt is afebrile, non-toxic appearing. Abd: tenderness in LLQ.   Labs: unremarkable. CT abd: evidence of moderate sigmoid diverticulitis. No evidence of abscess or other complication.   Pain did improve with IV dilaudid. Discussed pt with Dr. Rolland Porter who agrees pt may be given 1st dose of cipro and flagyl then discharged home with strict return precautions and f/u with PCP next week.  Pt verbalized understanding and agreement with tx plan. Pt asking for something to drink. Will give fluid challenge while IV antibiotics are given.     Pain controlled in ED. Will discharge pt home with PO cipro and flagyl as well as phenergan and norco for pain and nausea. Advised pt to f/u with PCP next week for recheck of symptoms. Return precautions provided. Pt verbalized understanding and agreement with tx plan.     Noland Fordyce, PA-C 01/28/14 0111

## 2014-01-27 NOTE — Discharge Instructions (Signed)
Thank you for coming in today. Go directly to the emergency room.  Do not eat or drink anything.  You are at risk for death or disability if you do not go to the emergency room.  If your belly pain worsens, or you have high fever, bad vomiting, blood in your stool or black tarry stool go to the Emergency Room.

## 2014-01-27 NOTE — ED Provider Notes (Signed)
Chad Guerra is a 63 y.o. male who presents to Urgent Care today for abdominal pain. Patient has a one-day history of worsening left lower corner and abdominal pain. The pain is similar in nature to but not exactly consistent with previous episodes of diverticulitis. In February of 2014 her laparoscopic cholecystectomy with complications including umbilical hernia that has subsequently been repaired. He denies any nausea vomiting or diarrhea fevers or chills or blood in the stool. His last bowel movement was earlier today.   Past Medical History  Diagnosis Date  . Medical history non-contributory    History  Substance Use Topics  . Smoking status: Never Smoker   . Smokeless tobacco: Never Used  . Alcohol Use: No   ROS as above Medications: No current facility-administered medications for this encounter.   Current Outpatient Prescriptions  Medication Sig Dispense Refill  . cholecalciferol (VITAMIN D) 1000 UNITS tablet Take 1,000 Units by mouth daily.      Marland Kitchen HYDROcodone-acetaminophen (NORCO) 10-325 MG per tablet Take 1 tablet by mouth every 6 (six) hours as needed for pain.  60 tablet  0  . indomethacin (INDOCIN) 50 MG capsule Take 1 capsule (50 mg total) by mouth 3 (three) times daily with meals.  10 capsule  0  . methocarbamol (ROBAXIN) 500 MG tablet Take 1 tablet (500 mg total) by mouth 3 (three) times daily as needed.  60 tablet  0  . Multiple Vitamin (MULTIVITAMIN WITH MINERALS) TABS Take 1 tablet by mouth daily.       Facility-Administered Medications Ordered in Other Encounters  Medication Dose Route Frequency Provider Last Rate Last Dose  . glycopyrrolate (ROBINUL) injection    PRN Sherry Ruffing, CRNA   0.5 mg at 07/04/12 0915  . neostigmine (PROSTIGMINE) injection   Intravenous PRN Sherry Ruffing, CRNA   4 mg at 07/04/12 0915  . ondansetron (ZOFRAN) injection    PRN Sherry Ruffing, CRNA   2 mg at 07/04/12 0815    Exam:  BP 98/72  Pulse 78  Temp(Src) 98.4  F (36.9 C) (Oral)  Resp 16  SpO2 98% Gen: Well NAD HEENT: EOMI,  MMM Lungs: Normal work of breathing. CTABL Heart: RRR no MRG Abd: NABS, significantly tender to palpation left lower quadrant with rebound and guarding. Exts: Brisk capillary refill, warm and well perfused.   No results found for this or any previous visit (from the past 24 hour(s)). No results found.  Assessment and Plan: 63 y.o. male with significant left lower quadrant abdominal pain. Concerning for diverticulitis versus microperforation. Recommend patient go directly to the emergency department for evaluation and management. I warned him of the possibility of death or disability. He expresses understanding and agreement.  Discussed warning signs or symptoms. Please see discharge instructions. Patient expresses understanding.   This note was created using Systems analyst. Any transcription errors are unintended.    Gregor Hams, MD 01/27/14 [phone]

## 2014-01-28 MED ORDER — HYDROCODONE-ACETAMINOPHEN 5-325 MG PO TABS: 1.0000 | ORAL_TABLET | ORAL | Status: DC | PRN

## 2014-01-28 MED ORDER — PROMETHAZINE HCL 25 MG PO TABS: 25.0000 mg | ORAL_TABLET | Freq: Four times a day (QID) | ORAL | Status: DC | PRN

## 2014-01-28 MED ORDER — METRONIDAZOLE 500 MG PO TABS: 500.0000 mg | ORAL_TABLET | Freq: Two times a day (BID) | ORAL | Status: DC

## 2014-01-28 MED ORDER — CIPROFLOXACIN HCL 500 MG PO TABS: 500.0000 mg | ORAL_TABLET | Freq: Two times a day (BID) | ORAL | Status: DC

## 2014-01-28 NOTE — Discharge Instructions (Signed)

## 2014-01-28 NOTE — ED Notes (Signed)
Pt completed fluid challenge with no n/v.

## 2014-01-28 NOTE — ED Provider Notes (Signed)
Medical screening examination/treatment/procedure(s) were performed by non-physician practitioner and as supervising physician I was immediately available for consultation/collaboration.   EKG Interpretation None      Rolland Porter, MD, Abram Sander   Janice Norrie, MD 01/28/14 [phone]

## 2014-02-10 IMAGING — CR DG ANKLE COMPLETE 3+V*R*
3 series · 3 of 3 positions shown · non-contrast
Comparison: None.

CLINICAL DATA: Foot and ankle pain, swelling.  No known injury.

EXAM:
RIGHT ANKLE - COMPLETE 3+ VIEW

[x ankle ap right]
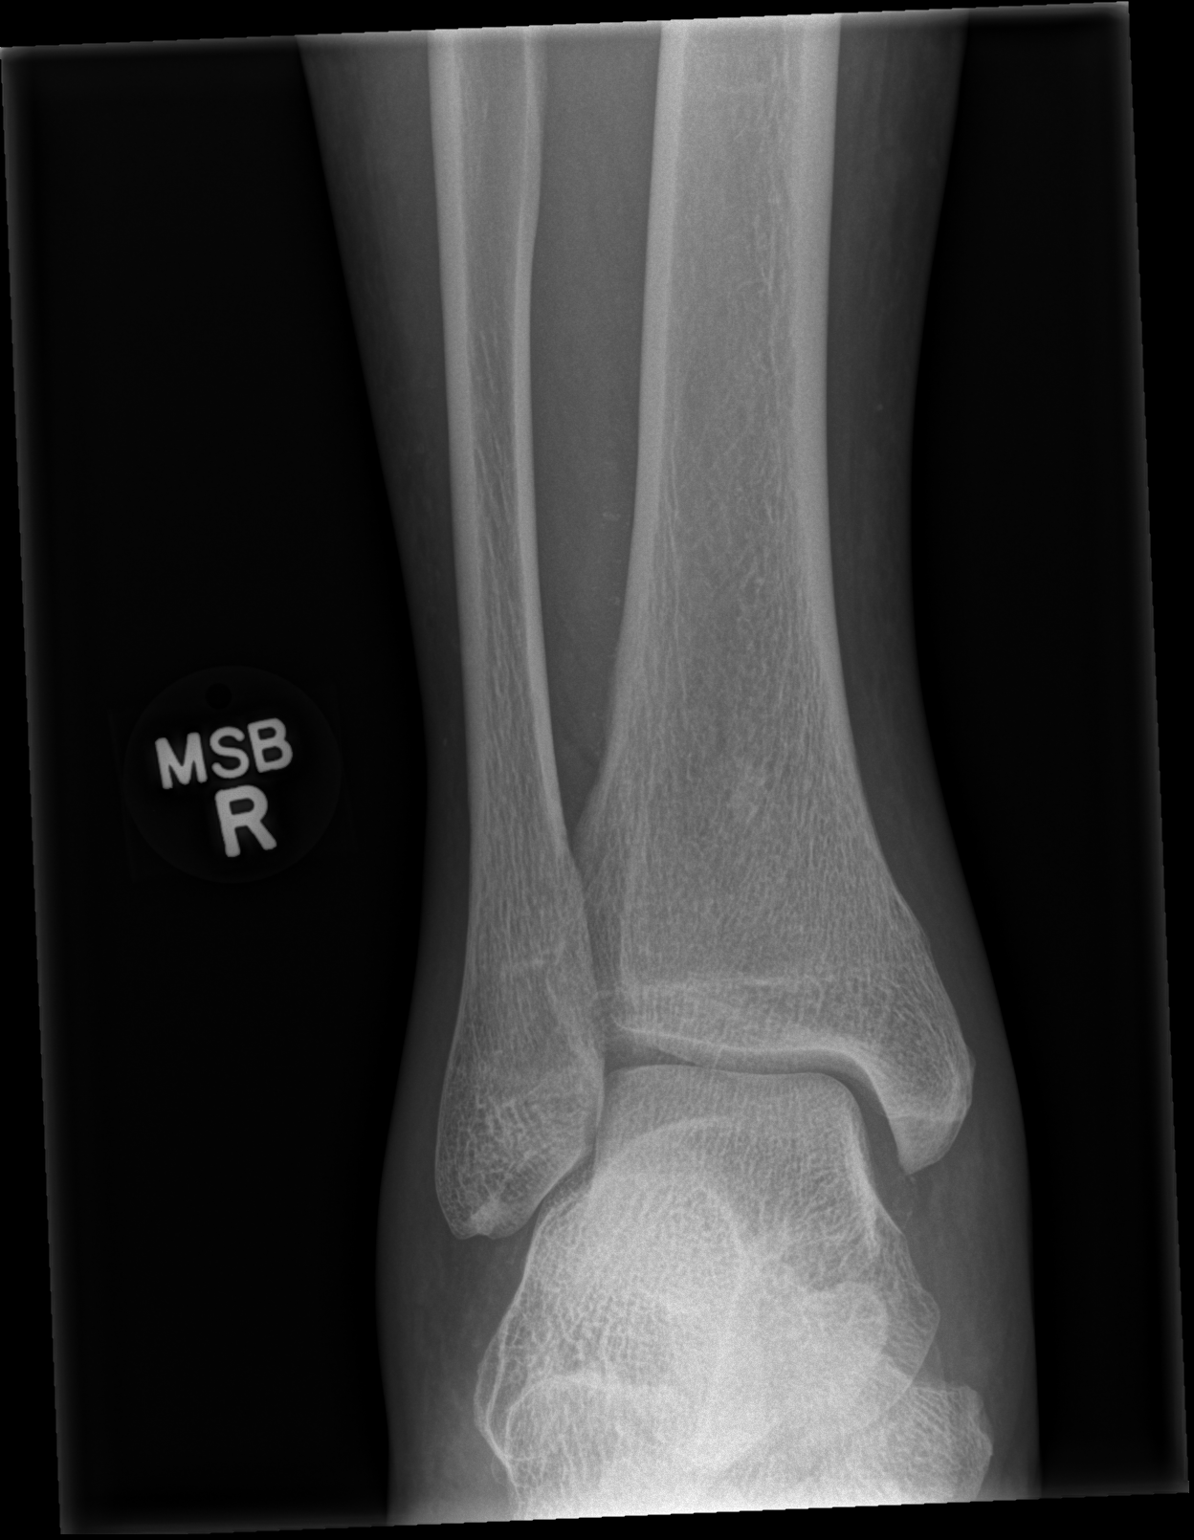

[x ankle obl right]
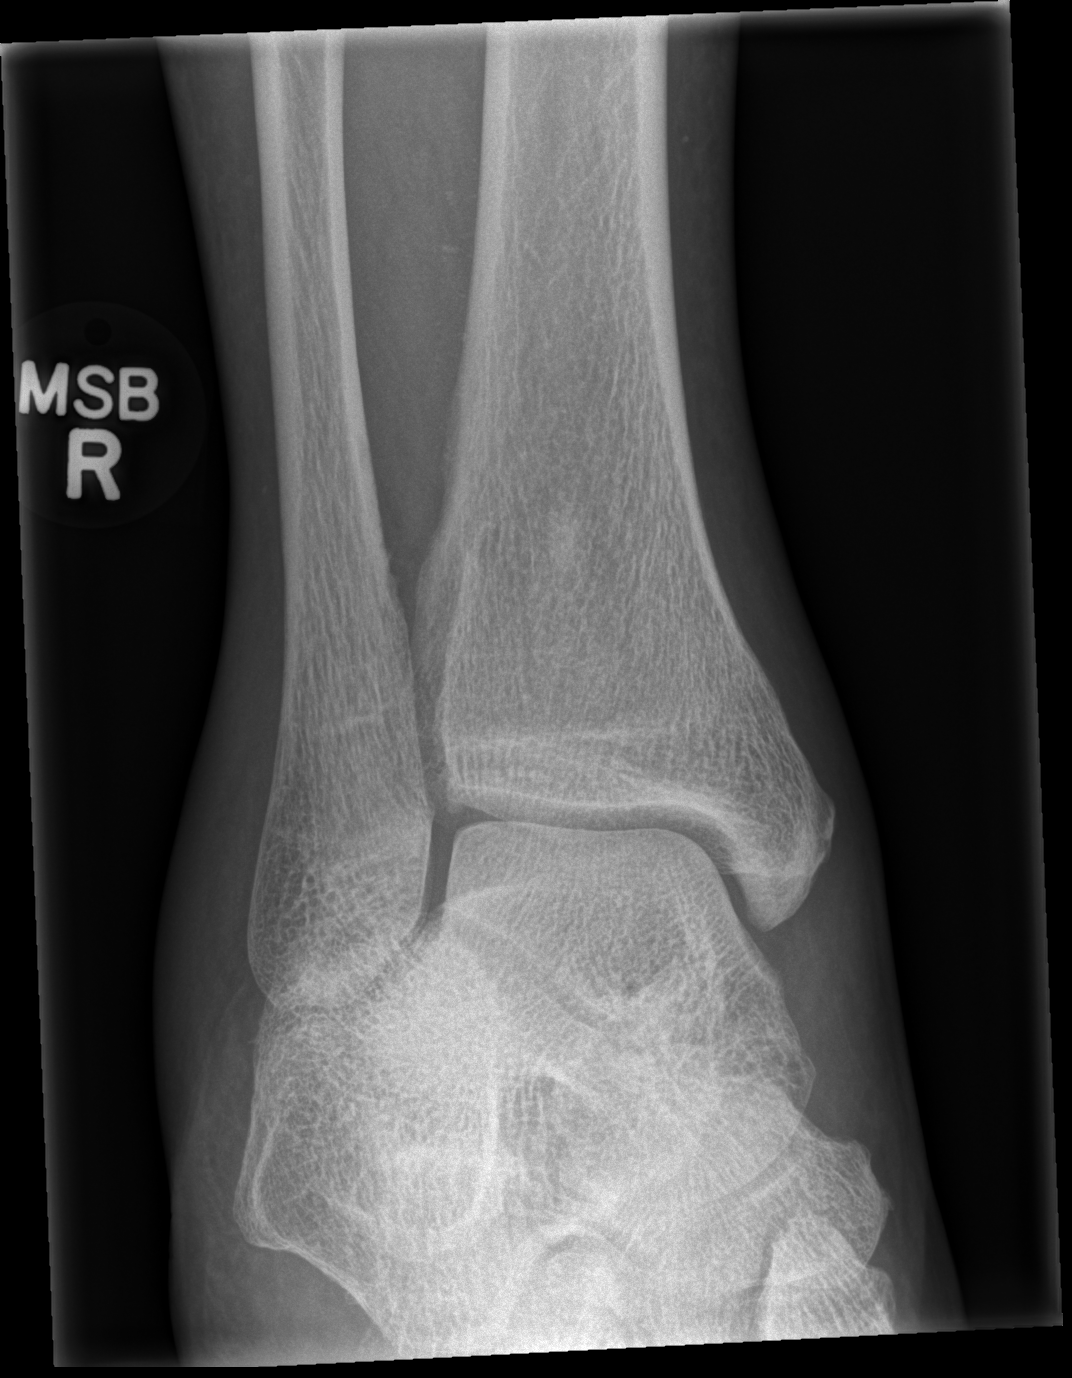

[x ankle lat right]
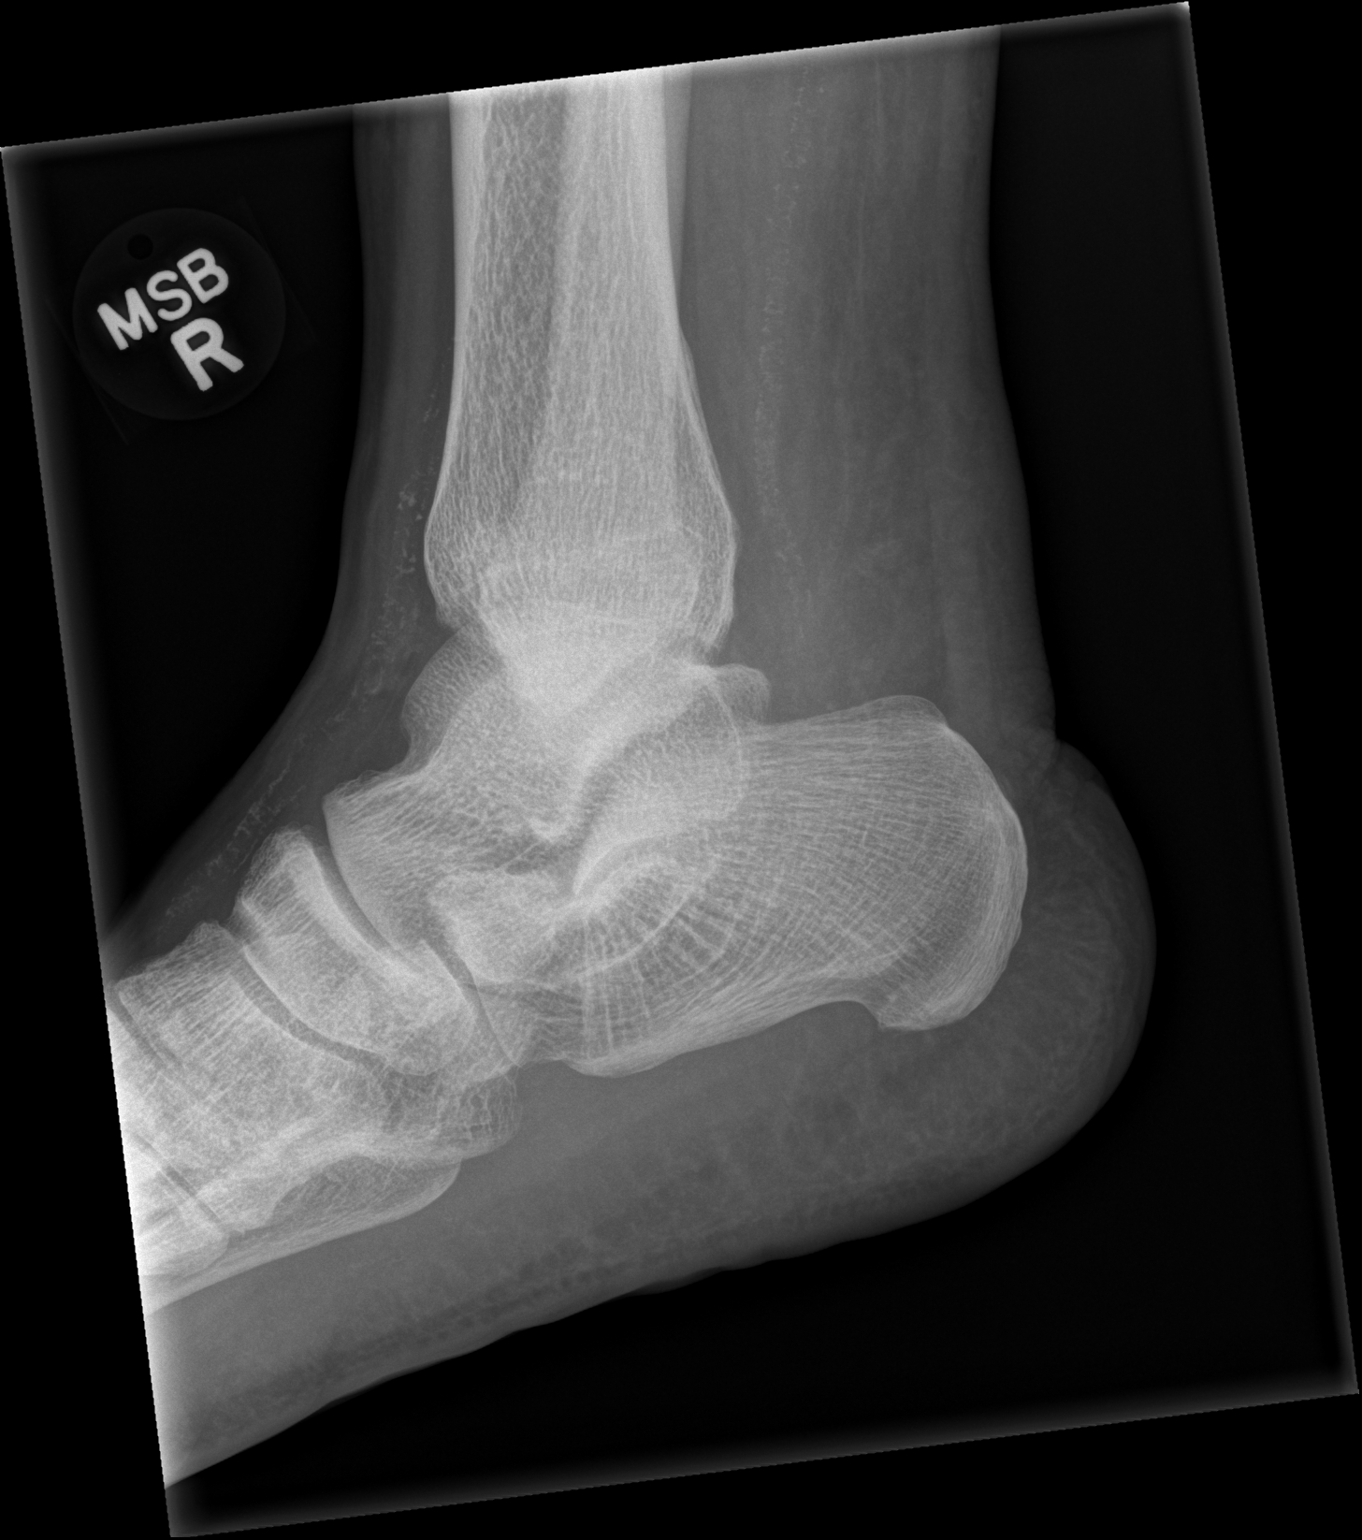

[3 of 3 positions shown; findings below may reference images not displayed]

FINDINGS: No acute bony abnormality. Specifically, no fracture, subluxation,
or dislocation. Soft tissues are intact. Vascular calcifications
noted.
IMPRESSION: No acute bony abnormality.

## 2014-03-23 ENCOUNTER — Emergency Department (HOSPITAL_COMMUNITY): Payer: Self-pay

## 2014-03-23 ENCOUNTER — Emergency Department (HOSPITAL_COMMUNITY)
Admission: EM | Admit: 2014-03-23 | Discharge: 2014-03-23 | Disposition: A | Discharge status: Home / Self Care | Payer: Self-pay | Attending: Emergency Medicine | Admitting: Emergency Medicine

## 2014-03-23 ENCOUNTER — Encounter (HOSPITAL_COMMUNITY): Payer: Self-pay | Admitting: Emergency Medicine

## 2014-03-23 DIAGNOSIS — Z79899 Other long term (current) drug therapy: Secondary | ICD-10-CM | POA: Insufficient documentation

## 2014-03-23 DIAGNOSIS — K610 Anal abscess: Secondary | ICD-10-CM | POA: Insufficient documentation

## 2014-03-23 DIAGNOSIS — I10 Essential (primary) hypertension: Secondary | ICD-10-CM | POA: Insufficient documentation

## 2014-03-23 LAB — I-STAT CHEM 8, ED
BUN: 8 mg/dL (ref 6–23)
CALCIUM ION: 1.15 mmol/L (ref 1.13–1.30)
CHLORIDE: 102 meq/L (ref 96–112)
Creatinine, Ser: 1.1 mg/dL (ref 0.50–1.35)
Glucose, Bld: 124 mg/dL — ABNORMAL HIGH (ref 70–99)
HEMATOCRIT: 51 % (ref 39.0–52.0)
Hemoglobin: 17.3 g/dL — ABNORMAL HIGH (ref 13.0–17.0)
Potassium: 3.8 mEq/L (ref 3.7–5.3)
SODIUM: 140 meq/L (ref 137–147)
TCO2: 28 mmol/L (ref 0–100)

## 2014-03-23 LAB — BASIC METABOLIC PANEL
ANION GAP: 12 (ref 5–15)
BUN: 9 mg/dL (ref 6–23)
CHLORIDE: 101 meq/L (ref 96–112)
CO2: 25 mEq/L (ref 19–32)
Calcium: 9.3 mg/dL (ref 8.4–10.5)
Creatinine, Ser: 1.1 mg/dL (ref 0.50–1.35)
GFR calc non Af Amer: 70 mL/min — ABNORMAL LOW (ref >90)
GFR, EST AFRICAN AMERICAN: 81 mL/min — AB (ref >90)
Glucose, Bld: 126 mg/dL — ABNORMAL HIGH (ref 70–99)
Potassium: 4 mEq/L (ref 3.7–5.3)
SODIUM: 138 meq/L (ref 137–147)

## 2014-03-23 LAB — CBC WITH DIFFERENTIAL/PLATELET
BASOS ABS: 0 10*3/uL (ref 0.0–0.1)
BASOS PCT: 0 % (ref 0–1)
Eosinophils Absolute: 0.1 10*3/uL (ref 0.0–0.7)
Eosinophils Relative: 1 % (ref 0–5)
HCT: 45.8 % (ref 39.0–52.0)
Hemoglobin: 15.9 g/dL (ref 13.0–17.0)
LYMPHS PCT: 22 % (ref 12–46)
Lymphs Abs: 1.5 10*3/uL (ref 0.7–4.0)
MCH: 31.6 pg (ref 26.0–34.0)
MCHC: 34.7 g/dL (ref 30.0–36.0)
MCV: 91.1 fL (ref 78.0–100.0)
MONO ABS: 0.6 10*3/uL (ref 0.1–1.0)
Monocytes Relative: 9 % (ref 3–12)
NEUTROS ABS: 4.9 10*3/uL (ref 1.7–7.7)
NEUTROS PCT: 68 % (ref 43–77)
PLATELETS: 235 10*3/uL (ref 150–400)
RBC: 5.03 MIL/uL (ref 4.22–5.81)
RDW: 12.5 % (ref 11.5–15.5)
WBC: 7.1 10*3/uL (ref 4.0–10.5)

## 2014-03-23 LAB — I-STAT CG4 LACTIC ACID, ED: Lactic Acid, Venous: 1.6 mmol/L (ref 0.5–2.2)

## 2014-03-23 MED ORDER — CIPROFLOXACIN HCL 500 MG PO TABS: 500.0000 mg | ORAL_TABLET | Freq: Two times a day (BID) | ORAL | Status: DC

## 2014-03-23 MED ORDER — LIDOCAINE-EPINEPHRINE (PF) 2 %-1:200000 IJ SOLN
10.0000 mL | Freq: Once | INTRAMUSCULAR | Status: AC
  Administered 2014-03-23: 10 mL via INTRADERMAL

## 2014-03-23 MED ORDER — METRONIDAZOLE 500 MG PO TABS
500.0000 mg | ORAL_TABLET | Freq: Once | ORAL | Status: AC
  Administered 2014-03-23: 500 mg via ORAL
  Filled 2014-03-23: qty 1

## 2014-03-23 MED ORDER — METRONIDAZOLE 500 MG PO TABS: 500.0000 mg | ORAL_TABLET | Freq: Two times a day (BID) | ORAL | Status: DC

## 2014-03-23 MED ORDER — HYDROMORPHONE HCL 1 MG/ML IJ SOLN
0.5000 mg | Freq: Once | INTRAMUSCULAR | Status: AC
  Administered 2014-03-23: 0.5 mg via INTRAVENOUS
  Filled 2014-03-23: qty 1

## 2014-03-23 MED ORDER — MORPHINE SULFATE 4 MG/ML IJ SOLN
4.0000 mg | INTRAMUSCULAR | Status: DC | PRN
  Administered 2014-03-23 (×2): 4 mg via INTRAVENOUS
  Filled 2014-03-23 (×2): qty 1

## 2014-03-23 MED ORDER — CIPROFLOXACIN HCL 500 MG PO TABS
500.0000 mg | ORAL_TABLET | Freq: Once | ORAL | Status: AC
  Administered 2014-03-23: 500 mg via ORAL
  Filled 2014-03-23: qty 1

## 2014-03-23 MED ORDER — LIDOCAINE-EPINEPHRINE 2 %-1:100000 IJ SOLN
20.0000 mL | Freq: Once | INTRAMUSCULAR | Status: AC
Start: 2014-03-23 — End: 2014-03-23

## 2014-03-23 MED ORDER — IOHEXOL 300 MG/ML  SOLN
100.0000 mL | Freq: Once | INTRAMUSCULAR | Status: AC | PRN
  Administered 2014-03-23: 100 mL via INTRAVENOUS

## 2014-03-23 MED ORDER — ONDANSETRON HCL 4 MG/2ML IJ SOLN
4.0000 mg | Freq: Once | INTRAMUSCULAR | Status: AC
  Administered 2014-03-23: 4 mg via INTRAVENOUS
  Filled 2014-03-23: qty 2

## 2014-03-23 MED ORDER — SODIUM CHLORIDE 0.9 % IV BOLUS (SEPSIS)
1000.0000 mL | Freq: Once | INTRAVENOUS | Status: AC
  Administered 2014-03-23: 1000 mL via INTRAVENOUS

## 2014-03-23 MED ORDER — HYDROCODONE-ACETAMINOPHEN 5-325 MG PO TABS: ORAL_TABLET | ORAL | Status: DC

## 2014-03-23 NOTE — ED Provider Notes (Signed)
CSN: [id number]     Arrival date & time 03/23/14  1343 History  This chart was scribed for non-physician practitioner working with No att. providers found by Mercy Moore, ED Scribe. This patient was seen in room WTR7/WTR7 and the patient's care was started at 4:31 PM.   Chief Complaint  Patient presents with  . Abscess    rectum   The history is provided by the patient. No language interpreter was used.   HPI Comments: Chad Guerra is a 63 y.o. male who presents to the Emergency Department complaining of a painful, erythematous abscess on the right side of his rectum. Patient reports irritation there for 4-5 days, but last night patient reports "escalation" described as increased pain and swelling. 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 Patient reports severe pain with his bowel movements. Patient reports exacerbated pain with sitting, standing and walking. Patient denies drainage from the area. Patient denies history of similar abscess.    Past Medical History  Diagnosis Date  . Medical history non-contributory   . Hypertension    Past Surgical History  Procedure Laterality Date  . Hernia repair    . Rotator cuff repair    . Cholecystectomy N/A 07/04/2012    Procedure: LAPAROSCOPIC CHOLECYSTECTOMY WITH INTRAOPERATIVE CHOLANGIOGRAM;  Surgeon: Pedro Earls, MD;  Location: [facility];  Service: General;  Laterality: N/A;  . Knee arthroscopy w/ acl reconstruction      LEFT  . Anterior fusion cervical spine  08/25/2012    Dr Rolena Infante  . Anterior cervical decomp/discectomy fusion N/A 08/25/2012    Procedure: ANTERIOR CERVICAL DECOMPRESSION/DISCECTOMY FUSION 1 LEVEL (ACDF C5-6);  Surgeon: Melina Schools, MD;  Location: [place];  Service: Orthopedics;  Laterality: N/A;   No family history on file. History  Substance Use Topics  . Smoking status: Never Smoker   . Smokeless tobacco: Never Used  . Alcohol  Use: No    Review of Systems  Constitutional: Negative for fever and chills.    Allergies  Percocet  Home Medications   Prior to Admission medications   Medication Sig Start Date End Date Taking? Authorizing Provider  cholecalciferol (VITAMIN D) 1000 UNITS tablet Take 1,000 Units by mouth daily.   Yes Historical Provider, MD  lisinopril (PRINIVIL,ZESTRIL) 20 MG tablet Take 20 mg by mouth daily.   Yes Historical Provider, MD  methocarbamol (ROBAXIN) 500 MG tablet Take 500 mg by mouth daily as needed for muscle spasms (muscle spasms).   Yes Historical Provider, MD  Multiple Vitamin (MULTIVITAMIN WITH MINERALS) TABS Take 1 tablet by mouth daily.   Yes Historical Provider, MD  ciprofloxacin (CIPRO) 500 MG tablet Take 1 tablet (500 mg total) by mouth every 12 (twelve) hours. 03/23/14   Jerris Keltz, PA-C  HYDROcodone-acetaminophen (NORCO/VICODIN) 5-325 MG per tablet Take 1-2 tablets by mouth every 6 hours as needed for pain. 03/23/14   Diann Bangerter, PA-C  metroNIDAZOLE (FLAGYL) 500 MG tablet Take 1 tablet (500 mg total) by mouth 2 (two) times daily. One tab PO bid x 14 days 03/23/14   Elmyra Ricks Ainsleigh Kakos, PA-C   Triage Vitals: BP 175/87  Pulse 73  Temp(Src) 97.7 F (36.5 C) (Oral)  Resp 19  SpO2 98%  Physical Exam  Nursing note and vitals reviewed. Constitutional: He is oriented to person, place, and time. He appears well-developed and well-nourished. No distress.  HENT:  Head: Normocephalic and atraumatic.  Mouth/Throat: Oropharynx is clear and  moist.  Eyes: EOM are normal. Pupils are equal, round, and reactive to light.  Neck: Neck supple. No tracheal deviation present.  Cardiovascular: Normal rate, regular rhythm and intact distal pulses.   Pulmonary/Chest: Effort normal and breath sounds normal. No respiratory distress.  Abdominal: Soft. Bowel sounds are normal. He exhibits no distension and no mass. There is no tenderness. There is no rebound and no guarding.   Genitourinary:  Rectal examination performed by technician: There is a fluctuant abscess less than 1 cm away from the anal verge the right side. Patient will not tolerate a digital rectal exam.  Musculoskeletal: Normal range of motion.  Neurological: He is alert and oriented to person, place, and time.  Skin: Skin is warm and dry.  Psychiatric: He has a normal mood and affect. His behavior is normal.    ED Course  INCISION AND DRAINAGE Date/Time: 03/23/2014 10:24 PM Performed by: Monico Blitz Authorized by: Monico Blitz Consent: Verbal consent obtained. Consent given by: patient Patient identity confirmed: verbally with patient Type: abscess Body area: anogenital Location details: perianal Anesthesia: local infiltration Local anesthetic: lidocaine 2% with epinephrine Anesthetic total: 4 ml Patient sedated: no Scalpel size: 11 Incision type: Cruciate. Complexity: simple Drainage: purulent Drainage amount: moderate Wound treatment: wound left open Packing material: none Patient tolerance: Patient tolerated the procedure well with no immediate complications.   (including critical care time)  COORDINATION OF CARE: 4:42 PM- Plans to obtain CT to assess how deeply the abscess is rooted. Discussed treatment plan with patient at bedside and patient agreed to plan.   Labs Review Labs Reviewed  BASIC METABOLIC PANEL - Abnormal; Notable for the following:    Glucose, Bld 126 (*)    GFR calc non Af Amer 70 (*)    GFR calc Af Amer 81 (*)    All other components within normal limits  I-STAT CHEM 8, ED - Abnormal; Notable for the following:    Glucose, Bld 124 (*)    Hemoglobin 17.3 (*)    All other components within normal limits  CBC WITH DIFFERENTIAL  I-STAT CG4 LACTIC ACID, ED    Imaging Review Ct Abdomen Pelvis W Contrast  03/23/2014   CLINICAL DATA:  Palpable abnormality to right aspect of the rectum that is painful with sitting, standing, and walking  EXAM:  CT ABDOMEN AND PELVIS WITH CONTRAST  TECHNIQUE: Multidetector CT imaging of the abdomen and pelvis was performed using the standard protocol following bolus administration of intravenous contrast.  CONTRAST:  195mL OMNIPAQUE IOHEXOL 300 MG/ML  SOLN  COMPARISON:  01/27/2014  FINDINGS: Lower chest:  Lung bases are clear.  Hepatobiliary: Liver is unremarkable.  Status post cholecystectomy. No intrahepatic or extrahepatic ductal dilatation.  Pancreas: Within normal limits.  Spleen: Within normal limits.  Adrenals/Urinary Tract: Adrenal glands are unremarkable.  Kidneys are grossly unremarkable.  No hydronephrosis.  Bladder is mildly thick-walled but otherwise unremarkable.  Stomach/Bowel: Stomach is unremarkable.  No evidence of bowel obstruction.  Normal appendix.  Colonic diverticulosis, without evidence of diverticulitis.  Vascular/Lymphatic: Atherosclerotic calcifications of the abdominal aorta and branch vessels.  No suspicious abdominopelvic lymphadenopathy.  Reproductive: Prostatomegaly, with enlargement of the central gland which indents the base of the bladder.  Other: No abdominopelvic ascites.  14 x 11 x 11 mm fluid collection along the right gluteal fold (series 2/image 36), likely corresponding to the palpable abnormality, worrisome for a small abscess. The location suggests the possibility of a perianal/perirectal fistula as the underlying etiology, although a subcutaneous abnormality (  without bowel communication) is also possible.  Musculoskeletal: Mild degenerative changes at L3-4.  IMPRESSION: 14 mm fluid collection along the right gluteal fold, corresponding to the palpable abnormality, worrisome for a small abscess.  The location suggests the possibility of a perianal/perirectal fistula as the underlying etiology; less likely, this could reflect a subcutaneous abnormality without bowel communication.  If persistent/recurrent after appropriate antibiotic therapy, consider MRI pelvis with/without  contrast for further evaluation as clinically warranted.   Electronically Signed   By: Julian Hy M.D.   On: 03/23/2014 19:42     EKG Interpretation None      MDM   Final diagnoses:  Perianal abscess    Filed Vitals:   03/23/14 1404 03/23/14 2200  BP: 175/87 139/94  Pulse: 73 70  Temp: 97.7 F (36.5 C) 98.8 F (37.1 C)  TempSrc: Oral Oral  Resp: 19 18  SpO2: 98% 99%    Medications  morphine 4 MG/ML injection 4 mg (4 mg Intravenous Given 03/23/14 1958)  lidocaine-EPINEPHrine (XYLOCAINE W/EPI) 2 %-1:100000 (with pres) injection 20 mL (0 mLs Intradermal Duplicate 03/50/09 3818)  sodium chloride 0.9 % bolus 1,000 mL (0 mLs Intravenous Stopped 03/23/14 1957)  ondansetron (ZOFRAN) injection 4 mg (4 mg Intravenous Given 03/23/14 1740)  iohexol (OMNIPAQUE) 300 MG/ML solution 100 mL (100 mLs Intravenous Contrast Given 03/23/14 1919)  lidocaine-EPINEPHrine (XYLOCAINE W/EPI) 2 %-1:200000 (PF) injection 10 mL (10 mLs Intradermal Given 03/23/14 2117)  HYDROmorphone (DILAUDID) injection 0.5 mg (0.5 mg Intravenous Given 03/23/14 2117)  ciprofloxacin (CIPRO) tablet 500 mg (500 mg Oral Given 03/23/14 2159)  metroNIDAZOLE (FLAGYL) tablet 500 mg (500 mg Oral Given 03/23/14 2159)    Chad Guerra is a 63 y.o. male presenting with perianal abscess. Concern that this is a deeper complicated abscess because he will not tolerate a digital rectal exam, planof blood work and CT pelvis with contrast.  Patient has no leukocytosis, CT shows possible fistula.  Case discussed with general surgeon Dr. Johney Maine: Recommends performing incision and drainage in the ED making a cruciate excision, patient will be started on Cipro and Flagyl and advised on how to perform sitz baths.  Evaluation does not show pathology that would require ongoing emergent intervention or inpatient treatment. Pt is hemodynamically stable and mentating appropriately. Discussed findings and plan with patient/guardian, who  agrees with care plan. All questions answered. Return precautions discussed and outpatient follow up given.   Discharge Medication List as of 03/23/2014  9:57 PM    START taking these medications   Details  ciprofloxacin (CIPRO) 500 MG tablet Take 1 tablet (500 mg total) by mouth every 12 (twelve) hours., Starting 03/23/2014, Until Discontinued, Print    HYDROcodone-acetaminophen (NORCO/VICODIN) 5-325 MG per tablet Take 1-2 tablets by mouth every 6 hours as needed for pain., Print    metroNIDAZOLE (FLAGYL) 500 MG tablet Take 1 tablet (500 mg total) by mouth 2 (two) times daily. One tab PO bid x 14 days, Starting 03/23/2014, Until Discontinued, Print           I personally performed the services described in this documentation, which was scribed in my presence. The recorded information has been reviewed and is accurate.    Monico Blitz, PA-C 03/25/14 0630

## 2014-03-23 NOTE — Discharge Instructions (Signed)
Take your antibiotics as directed and to completion. You should never have any leftover antibiotics! Push fluids and stay well hydrated.   Do not drink alcohol while you are taking flagyl (metronidazole) because it will make you very sick.  Take vicodin for breakthrough pain, do not drink alcohol, drive, care for children or do other critical tasks while taking vicodin.  Please follow with your primary care doctor in the next 2 days for a check-up. They must obtain records for further management.   Do not hesitate to return to the Emergency Department for any new, worsening or concerning symptoms.     ANORECTAL SURGERY:  POST OPERATIVE INSTRUCTIONS  1. Take your usually prescribed home medications unless otherwise directed. 2. DIET: Follow a light bland diet the first 24 hours after arrival home, such as soup, liquids, crackers, etc.  Be sure to include lots of fluids daily.  Avoid fast food or heavy meals as your are more likely to get nauseated.  Eat a low fat the next few days after surgery.   3. PAIN CONTROL: a. Pain is best controlled by a usual combination of three different methods TOGETHER: i. Ice/Heat ii. Over the counter pain medication iii. Prescription pain medication b. Most patients will experience some swelling and discomfort in the anus/rectal area. and incisions.  Ice packs or heat (30-60 minutes up to 6 times a day) will help. Use ice for the first few days to help decrease swelling and bruising, then switch to heat such as warm towels, sitz baths, warm baths, etc to help relax tight/sore spots and speed recovery.  Some people prefer to use ice alone, heat alone, alternating between ice & heat.  Experiment to what works for you.  Swelling and bruising can take several weeks to resolve.   c. It is helpful to take an over-the-counter pain medication regularly for the first few weeks.  Choose one of the following that works best for you: i. Naproxen (Aleve, etc)  Two 220mg   tabs twice a day ii. Ibuprofen (Advil, etc) Three 200mg  tabs four times a day (every meal & bedtime) iii. Acetaminophen (Tylenol, etc) 500-650mg  four times a day (every meal & bedtime) d. A  prescription for pain medication (such as oxycodone, hydrocodone, etc) should be given to you upon discharge.  Take your pain medication as prescribed.  i. If you are having problems/concerns with the prescription medicine (does not control pain, nausea, vomiting, rash, itching, etc), please call us [phone] to see if we need to switch you to a different pain medicine that will work better for you and/or control your side effect better. ii. If you need a refill on your pain medication, please contact your pharmacy.  They will contact our office to request authorization. Prescriptions will not be filled after 5 pm or on week-ends.  Use a Sitz Bath 4-8 times a day for relief A sitz bath is a warm water bath taken in the sitting position that covers only the hips and buttocks. It may be used for either healing or hygiene purposes. Sitz baths are also used to relieve pain, itching, or muscle spasms. The water may contain medicine. Moist heat will help you heal and relax.  HOME CARE INSTRUCTIONS  Take 3 to 4 sitz baths a day. 1. Fill the bathtub half full with warm water. 2. Sit in the water and open the drain a little. 3. Turn on the warm water to keep the tub half full. Keep the water running  constantly. 4. Soak in the water for 15 to 20 minutes. 5. After the sitz bath, pat the affected area dry first. SEEK MEDICAL CARE IF:  You get worse instead of better. Stop the sitz baths if you get worse.   4. KEEP YOUR BOWELS REGULAR a. The goal is one bowel movement a day b. Avoid getting constipated.  Between the surgery and the pain medications, it is common to experience some constipation.  Increasing fluid intake and taking a fiber supplement (such as Metamucil, Citrucel, FiberCon, MiraLax, etc) 1-2 times a  day regularly will usually help prevent this problem from occurring.  A mild laxative (prune juice, Milk of Magnesia, MiraLax, etc) should be taken according to package directions if there are no bowel movements after 48 hours. c. Watch out for diarrhea.  If you have many loose bowel movements, simplify your diet to bland foods & liquids for a few days.  Stop any stool softeners and decrease your fiber supplement.  Switching to mild anti-diarrheal medications (Kayopectate, Pepto Bismol) can help.  If this worsens or does not improve, please call us.  5. Wound Care a. Remove your bandages the day after surgery.  Unless discharge instructions indicate otherwise, leave your bandage dry and in place overnight.  Remove the bandage during your first bowel movement.   b. Allow the wound packing to fall out over the next few days.  You can trim exposed gauze / ribbon as it falls out.  You do not need to repack the wound unless instructed otherwise.  Wear an absorbent pad or soft cotton gauze in your underwear as needed to catch any drainage and help keep the area  c. Keep the area clean and dry.  Bathe / shower every day.  Keep the area clean by showering / bathing over the incision / wound.   It is okay to soak an open wound to help wash it.  Wet wipes or showers / gentle washing after bowel movements is often less traumatic than regular toilet paper. d. Dennis Bast may have some styrofoam-like soft packing in the rectum which will come out with the first bowel movement.  e. You will often notice bleeding with bowel movements.  This should slow down by the end of the first week of surgery f. Expect some drainage.  This should slow down, too, by the end of the first week of surgery.  Wear an absorbent pad or soft cotton gauze in your underwear until the drainage stops. 6. ACTIVITIES as tolerated:   a. You may resume regular (light) daily activities beginning the next day--such as daily self-care, walking, climbing  stairs--gradually increasing activities as tolerated.  If you can walk 30 minutes without difficulty, it is safe to try more intense activity such as jogging, treadmill, bicycling, low-impact aerobics, swimming, etc. b. Save the most intensive and strenuous activity for last such as sit-ups, heavy lifting, contact sports, etc  Refrain from any heavy lifting or straining until you are off narcotics for pain control.   c. DO NOT PUSH THROUGH PAIN.  Let pain be your guide: If it hurts to do something, don't do it.  Pain is your body warning you to avoid that activity for another week until the pain goes down. d. You may drive when you are no longer taking prescription pain medication, you can comfortably sit for long periods of time, and you can safely maneuver your car and apply brakes. e. Dennis Bast may have sexual intercourse when it is comfortable.  7. FOLLOW UP in our office a. Please call [facility] at [phone] to set up an appointment to see your surgeon in the office for a follow-up appointment approximately 2 weeks after your surgery. b. Make sure that you call for this appointment the day you arrive home to insure a convenient appointment time. 10. IF YOU HAVE DISABILITY OR FAMILY LEAVE FORMS, BRING THEM TO THE OFFICE FOR PROCESSING.  DO NOT GIVE THEM TO YOUR DOCTOR.        WHEN TO CALL us [phone]: 1. Poor pain control 2. Reactions / problems with new medications (rash/itching, nausea, etc)  3. Fever over 101.5 F (38.5 C) 4. Inability to urinate 5. Nausea and/or vomiting 6. Worsening swelling or bruising 7. Continued bleeding from incision. 8. Increased pain, redness, or drainage from the incision  The clinic staff is available to answer your questions during regular business hours (8:30am-5pm).  Please dont hesitate to call and ask to speak to one of our nurses for clinical concerns.   A surgeon from [facility] is always on call at the hospitals   If you have a  medical emergency, go to the nearest emergency room or call 911.    [facility], [place], [place], [place], [place]  [address] ? MAIN: [phone] ? TOLL FREE: [phone] ? FAX [phone] [email or web address]  Peri-Rectal Abscess Your caregiver has diagnosed you as having a peri-rectal abscess. This is an infected area near the rectum that is filled with pus. If the abscess is near the surface of the skin, your caregiver may open (incise) the area and drain the pus. HOME CARE INSTRUCTIONS   If your abscess was opened up and drained. A small piece of gauze may be placed in the opening so that it can drain. Do not remove the gauze unless directed by your caregiver.  A loose dressing may be placed over the abscess site. Change the dressing as often as necessary to keep it clean and dry.  After the drain is removed, the area may be washed with a gentle antiseptic (soap) four times per day.  A warm sitz bath, warm packs or heating pad may be used for pain relief, taking care not to burn yourself.  Return for a wound check in 1 day or as directed.  An "inflatable doughnut" may be used for sitting with added comfort. These can be purchased at a drugstore or medical supply house.  To reduce pain and straining with bowel movements, eat a high fiber diet with plenty of fruits and vegetables. Use stool softeners as recommended by your caregiver. This is especially important if narcotic type pain medications were prescribed as these may cause marked constipation.  Only take over-the-counter or prescription medicines for pain, discomfort, or fever as directed by your caregiver. SEEK IMMEDIATE MEDICAL CARE IF:   You have increasing pain that is not controlled by medication.  There is increased inflammation (redness), swelling, bleeding, or drainage from the area.  An oral temperature above 102 F (38.9 C) develops.  You develop chills or  generalized malaise (feel lethargic or feel "washed out").  You develop any new symptoms (problems) you feel may be related to your present problem. Document Released: 05/09/2000 Document Revised: 08/04/2011 Document Reviewed: 05/09/2008 [facility] Patient Information 2015 [place], [place]. This information is not intended to replace advice given to you by your health care provider. Make sure you discuss any questions you have with your health care provider.

## 2014-03-23 NOTE — ED Notes (Signed)
Pt states that he has a bump on right side of rectum that is very painful with sitting, standing or walking.  Pt states that the bump has gotten larger since yesterday. And denies any drainage out of it. Pt states that about week ago when he wiped after having BM noticed some blood on tissue.

## 2014-03-27 ENCOUNTER — Telehealth (INDEPENDENT_AMBULATORY_CARE_PROVIDER_SITE_OTHER): Payer: Self-pay

## 2014-03-27 NOTE — Telephone Encounter (Signed)
Called pt to check up on him from having an I&D done in the ER after the physician consulted with Dr Johney [place] b/c he was on call. The pt is doing ok but feeling really sore still from the procedure and draining still. I scheduled the pt for a f/u appt with Dr Johney [place] for 11/9 arrive at 10:00/10:30. Pt requested that we mail him the appt card and any registration info. I will take care of this for the pt.

## 2015-01-05 ENCOUNTER — Ambulatory Visit: Payer: Self-pay

## 2015-02-13 DIAGNOSIS — H35033 Hypertensive retinopathy, bilateral: Secondary | ICD-10-CM | POA: Insufficient documentation

## 2015-02-13 DIAGNOSIS — H3581 Retinal edema: Secondary | ICD-10-CM | POA: Insufficient documentation

## 2015-05-31 DIAGNOSIS — H1131 Conjunctival hemorrhage, right eye: Secondary | ICD-10-CM | POA: Insufficient documentation

## 2017-05-23 DIAGNOSIS — M659 Synovitis and tenosynovitis, unspecified: Secondary | ICD-10-CM | POA: Insufficient documentation

## 2017-05-23 DIAGNOSIS — M6702 Short Achilles tendon (acquired), left ankle: Secondary | ICD-10-CM | POA: Insufficient documentation

## 2017-05-23 DIAGNOSIS — M65972 Unspecified synovitis and tenosynovitis, left ankle and foot: Secondary | ICD-10-CM | POA: Insufficient documentation

## 2017-12-29 DIAGNOSIS — M76829 Posterior tibial tendinitis, unspecified leg: Secondary | ICD-10-CM | POA: Insufficient documentation

## 2017-12-29 DIAGNOSIS — M21862 Other specified acquired deformities of left lower leg: Secondary | ICD-10-CM | POA: Insufficient documentation

## 2018-08-18 ENCOUNTER — Other Ambulatory Visit: Payer: Self-pay

## 2018-08-18 ENCOUNTER — Encounter (HOSPITAL_COMMUNITY): Payer: Self-pay | Admitting: Emergency Medicine

## 2018-08-18 ENCOUNTER — Ambulatory Visit (HOSPITAL_COMMUNITY)
Admission: EM | Admit: 2018-08-18 | Discharge: 2018-08-18 | Disposition: A | Discharge status: Home / Self Care | Payer: Self-pay | Attending: Family Medicine | Admitting: Family Medicine

## 2018-08-18 DIAGNOSIS — N451 Epididymitis: Secondary | ICD-10-CM

## 2018-08-18 DIAGNOSIS — N50811 Right testicular pain: Secondary | ICD-10-CM

## 2018-08-18 MED ORDER — LEVOFLOXACIN 500 MG PO TABS: 500.0000 mg | ORAL_TABLET | Freq: Every day | ORAL | 0 refills | Status: DC

## 2018-08-18 NOTE — ED Triage Notes (Signed)
PT reports scrotum is swollen and painful. Got worse Saturday. PT is a veteran and the [place] referred him here for possible infection. PT reports discolored ejaculate.

## 2018-08-18 NOTE — ED Provider Notes (Signed)
[facility]   [id number] 08/18/18 Arrival Time: 1219  ASSESSMENT & PLAN:  1. Testicular pain, right   2. Epididymitis    Will treat empirically or epididymitis. Swelling and discomfort are already improving gradually. No indications for urgent testicular ultrasound at this time. No suspicion for testicular torsion. But agrees to proceed to the ED should his symptoms worsen in any way. Discussed. Has planned f/u with the [place].  Meds ordered this encounter  Medications   levofloxacin (LEVAQUIN) 500 MG tablet    Sig: Take 1 tablet (500 mg total) by mouth daily.    Dispense:  10 tablet    Refill:  0   See AVS for d/c instructions.  Follow-up Information    [facility].   Specialty:  Emergency Medicine Why:  If symptoms worsen in any way. Contact information: [address] [id number] [place] [phone]         Reviewed expectations re: course of current medical issues. Questions answered. Outlined signs and symptoms indicating need for more acute intervention. Patient verbalized understanding. After Visit Summary given.   SUBJECTIVE: History from: patient. Chad Guerra is a 68 y.o. male who presents with complaint of intermittent R scrotal discomfort discomfort. Onset gradual, a few days ago. Discomfort described as aching; without radiation. Symptoms are about the same since beginning. Fever: absent. Aggravating factors: have not been identified. Alleviating factors: have not been identified. Associated symptoms: none reported. He denies arthralgias, chills, constipation, diarrhea, dysuria, fever, headache, nausea, sweats and vomiting. Appetite: normal. PO intake: normal. Ambulatory without assistance. Denies: urinary frequency, hematuria, urinary hesitancy, urinary retention and urinary incontinence. Bowel movements: have not significantly changed; last bowel movement within the past  1-2 days and without blood. History of similar: no. OTC treatment: none.  Past Surgical History:  Procedure Laterality Date   ANTERIOR CERVICAL DECOMP/DISCECTOMY FUSION N/A 08/25/2012   Procedure: ANTERIOR CERVICAL DECOMPRESSION/DISCECTOMY FUSION 1 LEVEL (ACDF C5-6);  Surgeon: Melina Schools, MD;  Location: [facility];  Service: Orthopedics;  Laterality: N/A;   ANTERIOR FUSION CERVICAL SPINE  08/25/2012   Dr Rolena Infante   CHOLECYSTECTOMY N/A 07/04/2012   Procedure: LAPAROSCOPIC CHOLECYSTECTOMY WITH INTRAOPERATIVE CHOLANGIOGRAM;  Surgeon: Pedro Earls, MD;  Location: [facility];  Service: General;  Laterality: N/A;   HERNIA REPAIR     KNEE ARTHROSCOPY W/ ACL RECONSTRUCTION     LEFT   ROTATOR CUFF REPAIR      ROS: As per HPI. All other systems negative.  OBJECTIVE:  Vitals:   08/18/18 1308  BP: (!) 156/92  Pulse: 64  Resp: 16  Temp: 98.3 F (36.8 C)  TempSrc: Oral  SpO2: 100%    General appearance: alert, oriented, no acute distress Lungs: clear to auscultation bilaterally; unlabored respirations Heart: regular rate and rhythm Abdomen: soft; without distention; no tenderness; normal bowel sounds; without masses or organomegaly; without guarding or rebound tenderness GU: slight R scrotal swelling; he is tender over R epididymis; no scrotal masses appreciated Back: without CVA tenderness; FROM at waist Extremities: without LE edema; symmetrical; without gross deformities Skin: warm and dry Neurologic: normal gait Psychological: alert and cooperative; normal mood and affect  Allergies  Allergen Reactions   Percocet [Oxycodone-Acetaminophen] Other (See Comments)    Shakes, chills, can tolerate tylenol  Past Medical History:  Diagnosis Date   Hypertension    Medical history non-contributory    Social History   Socioeconomic History   Marital status: Married    Spouse name: Not on file   Number of children: Not on file    Years of education: Not on file   Highest education level: Not on file  Occupational History   Not on file  Social Needs   Financial resource strain: Not on file   Food insecurity:    Worry: Not on file    Inability: Not on file   Transportation needs:    Medical: Not on file    Non-medical: Not on file  Tobacco Use   Smoking status: Never Smoker   Smokeless tobacco: Never Used  Substance and Sexual Activity   Alcohol use: No   Drug use: No   Sexual activity: Not on file  Lifestyle   Physical activity:    Days per week: Not on file    Minutes per session: Not on file   Stress: Not on file  Relationships   Social connections:    Talks on phone: Not on file    Gets together: Not on file    Attends religious service: Not on file    Active member of club or organization: Not on file    Attends meetings of clubs or organizations: Not on file    Relationship status: Not on file   Intimate partner violence:    Fear of current or ex partner: Not on file    Emotionally abused: Not on file    Physically abused: Not on file    Forced sexual activity: Not on file  Other Topics Concern   Not on file  Social History Narrative   Not on file   FH: HTN   Vanessa Kick, MD 08/26/18 [phone]

## 2018-08-18 NOTE — ED Triage Notes (Signed)
No dysuria today, but sometimes it is painful.

## 2019-07-10 ENCOUNTER — Emergency Department (HOSPITAL_COMMUNITY)
Admission: EM | Admit: 2019-07-10 | Discharge: 2019-07-10 | Disposition: A | Discharge status: Home / Self Care | Payer: Self-pay | Attending: Emergency Medicine | Admitting: Emergency Medicine

## 2019-07-10 ENCOUNTER — Encounter (HOSPITAL_COMMUNITY): Payer: Self-pay | Admitting: Emergency Medicine

## 2019-07-10 ENCOUNTER — Other Ambulatory Visit: Payer: Self-pay

## 2019-07-10 DIAGNOSIS — Z4789 Encounter for other orthopedic aftercare: Secondary | ICD-10-CM | POA: Insufficient documentation

## 2019-07-10 DIAGNOSIS — Z79899 Other long term (current) drug therapy: Secondary | ICD-10-CM | POA: Insufficient documentation

## 2019-07-10 DIAGNOSIS — I10 Essential (primary) hypertension: Secondary | ICD-10-CM | POA: Insufficient documentation

## 2019-07-10 NOTE — Discharge Instructions (Signed)
Please follow up with your orthopedist as previously scheduled.  Continue to use crutches and non weight bearing as previously recommended.

## 2019-07-10 NOTE — ED Notes (Signed)
Ortho tech called, will be here in less than 10 minutes

## 2019-07-10 NOTE — Progress Notes (Signed)
Orthopedic Tech Progress Note Patient Details:  Chad Guerra [birth year] [id number]  Casting Type of Cast: Short leg cast Cast Location: LLE Cast Material: Fiberglass Cast Intervention: Removal, Re-application  Post Interventions Patient Tolerated: Well Instructions Provided: Care of device, Adjustment of device     Janit Pagan 07/10/2019, 1:37 PM

## 2019-07-10 NOTE — ED Triage Notes (Signed)
Per patient, states he had foot surgery in [facility], [facility] had cast replaced once due to circulation, tightness-states his is still having issues with cast being too tight which is casing   Pain, not able to sleep

## 2019-07-10 NOTE — ED Provider Notes (Signed)
[facility] DEPT Provider Note   CSN: [id number] Arrival date & time: 07/10/19  1018     History Chief Complaint  Patient presents with  . Leg Pain    Chad Guerra is a 69 y.o. male.  The history is provided by the patient and medical records. No language interpreter was used.  Leg Pain Associated symptoms: no fever      69 year old male presenting complaining of foot pain.  Patient recently diagnosed with left posterior tibial tendon dysfunction status post surgical repair.  Patient was placed on a short leg cast and nonweightbearing.  He was last seen 2 days ago at his doctor's office due to complaints of increasing leg pain swelling and calf discomfort for approximately 2 days.  DVT study was performed and was negative.  Another cast was replaced however upon leaving the facility patient realized that this particular cast is also quite tight.  He tries to bear with the pain but still endorse significant discomfort to the posterior aspect of his calf as well as the anterior shin and near his toes.  He felt that this particular cast it is much tighter than it should have been.  He endorsed having normal sensation but still endorse sharp throbbing pain.  He reach out to his doctor who recommended to have his cast refitted prompting this ER visit.  No fever no chest pain or shortness of breath.  Past Medical History:  Diagnosis Date  . Hypertension   . Medical history non-contributory     Patient Active Problem List   Diagnosis Date Noted  . S/P laparoscopic cholecystectomy Feb 2014 07/04/2012    Past Surgical History:  Procedure Laterality Date  . ANTERIOR CERVICAL DECOMP/DISCECTOMY FUSION N/A 08/25/2012   Procedure: ANTERIOR CERVICAL DECOMPRESSION/DISCECTOMY FUSION 1 LEVEL (ACDF C5-6);  Surgeon: Melina Schools, MD;  Location: [facility];  Service: Orthopedics;  Laterality: N/A;  . ANTERIOR FUSION CERVICAL SPINE  08/25/2012   Dr Rolena Infante  .  CHOLECYSTECTOMY N/A 07/04/2012   Procedure: LAPAROSCOPIC CHOLECYSTECTOMY WITH INTRAOPERATIVE CHOLANGIOGRAM;  Surgeon: Pedro Earls, MD;  Location[facility];  Service: General;  Laterality: N/A;  . HERNIA REPAIR    . KNEE ARTHROSCOPY W/ ACL RECONSTRUCTION     LEFT  . ROTATOR CUFF REPAIR         No family history on file.  Social History   Tobacco Use  . Smoking status: Never Smoker  . Smokeless tobacco: Never Used  Substance Use Topics  . Alcohol use: No  . Drug use: No    Home Medications Prior to Admission medications   Medication Sig Start Date End Date Taking? Authorizing Provider  cholecalciferol (VITAMIN D) 1000 UNITS tablet Take 1,000 Units by mouth daily.    [provider]  ciprofloxacin (CIPRO) 500 MG tablet Take 1 tablet (500 mg total) by mouth every 12 (twelve) hours. 03/23/14   Pisciotta, Elmyra Ricks, PA-C  HYDROcodone-acetaminophen (NORCO/VICODIN) 5-325 MG per tablet Take 1-2 tablets by mouth every 6 hours as needed for pain. 03/23/14   Pisciotta, Elmyra Ricks, PA-C  levofloxacin (LEVAQUIN) 500 MG tablet Take 1 tablet (500 mg total) by mouth daily. 08/18/18   Vanessa Kick, MD  lisinopril (PRINIVIL,ZESTRIL) 20 MG tablet Take 20 mg by mouth daily.    [provider]  methocarbamol (ROBAXIN) 500 MG tablet Take 500 mg by mouth daily as needed for muscle spasms (muscle spasms).    [provider]  metroNIDAZOLE (FLAGYL) 500 MG tablet Take 1 tablet (500 mg  total) by mouth 2 (two) times daily. One tab PO bid x 14 days 03/23/14   Pisciotta, Elmyra Ricks, PA-C  Multiple Vitamin (MULTIVITAMIN WITH MINERALS) TABS Take 1 tablet by mouth daily.    [provider]    Allergies    Percocet [oxycodone-acetaminophen]  Review of Systems   Review of Systems  Constitutional: Negative for fever.  Musculoskeletal: Positive for myalgias.  Skin: Negative for rash.  Neurological: Negative for numbness.    Physical Exam Updated Vital Signs BP (!) 147/90   Pulse  64   Temp 98.3 F (36.8 C) (Oral)   Resp 15   SpO2 100%   Physical Exam Vitals and nursing note reviewed.  Constitutional:      General: He is not in acute distress.    Appearance: He is well-developed.  HENT:     Head: Atraumatic.  Eyes:     Conjunctiva/sclera: Conjunctivae normal.  Musculoskeletal:     Cervical back: Neck supple.     Comments: Short leg cast noted on left leg.  Sensation is intact to toes distally with brisk cap refill.  Will remove cast.  Skin:    Findings: No rash.  Neurological:     Mental Status: He is alert.     ED Results / Procedures / Treatments   Labs (all labs ordered are listed, but only abnormal results are displayed) Labs Reviewed - No data to display  EKG None  Radiology No results found.  Procedures Procedures (including critical care time)  Medications Ordered in ED Medications - No data to display  ED Course  I have reviewed the triage vital signs and the nursing notes.  Pertinent labs & imaging results that were available during my care of the patient were reviewed by me and considered in my medical decision making (see chart for details).    MDM Rules/Calculators/A&P                      BP (!) 147/90   Pulse 64   Temp 98.3 F (36.8 C) (Oral)   Resp 15   SpO2 100%   Final Clinical Impression(s) / ED Diagnoses Final diagnoses:  Encounter for replacement of cast    Rx / DC Orders ED Discharge Orders    None     11:17 AM Patient report having ill fitting left short leg cast status post surgical repair of his left ankle and foot.  The most recent cast has been placed 2 days ago but he felt it is ill fitting.  He has sensation to his toes.  Will removed the cast and replaced with a better fitting cast.  Low suspicion for compartment syndrome, or DVT.  1:28 PM Short cast was removed.  Pedal pulse intact, sensation intact, no evidence of infectious etiology or compartment syndrome.  Another short cast was replaced  and patient felt much better and more comfortable.  Patient is stable for discharge.   Domenic Moras, PA-C 07/10/19 1329    Virgel Manifold, MD 07/12/19 1247

## 2020-09-01 ENCOUNTER — Emergency Department (HOSPITAL_COMMUNITY): Payer: No Typology Code available for payment source

## 2020-09-01 ENCOUNTER — Inpatient Hospital Stay (HOSPITAL_COMMUNITY)
Admission: EM | Admit: 2020-09-01 | Discharge: 2020-09-04 | DRG: 392 | Disposition: A | Discharge status: Home / Self Care | Payer: No Typology Code available for payment source | Attending: Internal Medicine | Admitting: Internal Medicine

## 2020-09-01 ENCOUNTER — Other Ambulatory Visit: Payer: Self-pay

## 2020-09-01 ENCOUNTER — Encounter (HOSPITAL_COMMUNITY): Payer: Self-pay | Admitting: Emergency Medicine

## 2020-09-01 DIAGNOSIS — K5720 Diverticulitis of large intestine with perforation and abscess without bleeding: Secondary | ICD-10-CM | POA: Diagnosis not present

## 2020-09-01 DIAGNOSIS — I471 Supraventricular tachycardia: Secondary | ICD-10-CM | POA: Insufficient documentation

## 2020-09-01 DIAGNOSIS — Z91041 Radiographic dye allergy status: Secondary | ICD-10-CM

## 2020-09-01 DIAGNOSIS — Z86010 Personal history of colonic polyps: Secondary | ICD-10-CM

## 2020-09-01 DIAGNOSIS — Z9049 Acquired absence of other specified parts of digestive tract: Secondary | ICD-10-CM

## 2020-09-01 DIAGNOSIS — Z7901 Long term (current) use of anticoagulants: Secondary | ICD-10-CM

## 2020-09-01 DIAGNOSIS — N400 Enlarged prostate without lower urinary tract symptoms: Secondary | ICD-10-CM | POA: Diagnosis not present

## 2020-09-01 DIAGNOSIS — I480 Paroxysmal atrial fibrillation: Secondary | ICD-10-CM

## 2020-09-01 DIAGNOSIS — N433 Hydrocele, unspecified: Secondary | ICD-10-CM | POA: Diagnosis not present

## 2020-09-01 DIAGNOSIS — I472 Ventricular tachycardia, unspecified: Secondary | ICD-10-CM | POA: Insufficient documentation

## 2020-09-01 DIAGNOSIS — K429 Umbilical hernia without obstruction or gangrene: Secondary | ICD-10-CM | POA: Insufficient documentation

## 2020-09-01 DIAGNOSIS — K449 Diaphragmatic hernia without obstruction or gangrene: Secondary | ICD-10-CM | POA: Diagnosis present

## 2020-09-01 DIAGNOSIS — K5792 Diverticulitis of intestine, part unspecified, without perforation or abscess without bleeding: Secondary | ICD-10-CM | POA: Diagnosis present

## 2020-09-01 DIAGNOSIS — R300 Dysuria: Secondary | ICD-10-CM | POA: Diagnosis present

## 2020-09-01 DIAGNOSIS — D3502 Benign neoplasm of left adrenal gland: Secondary | ICD-10-CM | POA: Diagnosis present

## 2020-09-01 DIAGNOSIS — R600 Localized edema: Secondary | ICD-10-CM | POA: Insufficient documentation

## 2020-09-01 DIAGNOSIS — D709 Neutropenia, unspecified: Secondary | ICD-10-CM | POA: Diagnosis present

## 2020-09-01 DIAGNOSIS — Z981 Arthrodesis status: Secondary | ICD-10-CM

## 2020-09-01 DIAGNOSIS — I160 Hypertensive urgency: Secondary | ICD-10-CM | POA: Diagnosis present

## 2020-09-01 DIAGNOSIS — Z885 Allergy status to narcotic agent status: Secondary | ICD-10-CM

## 2020-09-01 DIAGNOSIS — E559 Vitamin D deficiency, unspecified: Secondary | ICD-10-CM | POA: Insufficient documentation

## 2020-09-01 DIAGNOSIS — K4020 Bilateral inguinal hernia, without obstruction or gangrene, not specified as recurrent: Secondary | ICD-10-CM | POA: Insufficient documentation

## 2020-09-01 DIAGNOSIS — Z8719 Personal history of other diseases of the digestive system: Secondary | ICD-10-CM

## 2020-09-01 DIAGNOSIS — E669 Obesity, unspecified: Secondary | ICD-10-CM | POA: Diagnosis present

## 2020-09-01 DIAGNOSIS — N503 Cyst of epididymis: Secondary | ICD-10-CM | POA: Diagnosis present

## 2020-09-01 DIAGNOSIS — IMO0002 Reserved for concepts with insufficient information to code with codable children: Secondary | ICD-10-CM | POA: Insufficient documentation

## 2020-09-01 DIAGNOSIS — Z6831 Body mass index (BMI) 31.0-31.9, adult: Secondary | ICD-10-CM

## 2020-09-01 DIAGNOSIS — I482 Chronic atrial fibrillation, unspecified: Secondary | ICD-10-CM | POA: Diagnosis present

## 2020-09-01 DIAGNOSIS — H401190 Primary open-angle glaucoma, unspecified eye, stage unspecified: Secondary | ICD-10-CM | POA: Diagnosis present

## 2020-09-01 DIAGNOSIS — I809 Phlebitis and thrombophlebitis of unspecified site: Secondary | ICD-10-CM | POA: Insufficient documentation

## 2020-09-01 DIAGNOSIS — Z20822 Contact with and (suspected) exposure to covid-19: Secondary | ICD-10-CM | POA: Diagnosis present

## 2020-09-01 DIAGNOSIS — I10 Essential (primary) hypertension: Secondary | ICD-10-CM | POA: Diagnosis present

## 2020-09-01 DIAGNOSIS — Z79899 Other long term (current) drug therapy: Secondary | ICD-10-CM

## 2020-09-01 DIAGNOSIS — D126 Benign neoplasm of colon, unspecified: Secondary | ICD-10-CM | POA: Diagnosis present

## 2020-09-01 DIAGNOSIS — K432 Incisional hernia without obstruction or gangrene: Secondary | ICD-10-CM | POA: Insufficient documentation

## 2020-09-01 DIAGNOSIS — N50819 Testicular pain, unspecified: Secondary | ICD-10-CM

## 2020-09-01 DIAGNOSIS — R609 Edema, unspecified: Secondary | ICD-10-CM | POA: Insufficient documentation

## 2020-09-01 DIAGNOSIS — R1084 Generalized abdominal pain: Secondary | ICD-10-CM

## 2020-09-01 HISTORY — DX: Primary open-angle glaucoma, unspecified eye, stage unspecified: H40.1190

## 2020-09-01 HISTORY — DX: Hydrocele, unspecified: N43.3

## 2020-09-01 HISTORY — DX: Paroxysmal atrial fibrillation: I48.0

## 2020-09-01 LAB — COMPREHENSIVE METABOLIC PANEL
ALT: 16 U/L (ref 0–44)
AST: 17 U/L (ref 15–41)
Albumin: 3.9 g/dL (ref 3.5–5.0)
Alkaline Phosphatase: 89 U/L (ref 38–126)
Anion gap: 5 (ref 5–15)
BUN: 15 mg/dL (ref 8–23)
CO2: 24 mmol/L (ref 22–32)
Calcium: 8.5 mg/dL — ABNORMAL LOW (ref 8.9–10.3)
Chloride: 107 mmol/L (ref 98–111)
Creatinine, Ser: 1.1 mg/dL (ref 0.61–1.24)
GFR, Estimated: >60 mL/min (ref >60)
Glucose, Bld: 96 mg/dL (ref 70–99)
Potassium: 3.6 mmol/L (ref 3.5–5.1)
Sodium: 136 mmol/L (ref 135–145)
Total Bilirubin: 1 mg/dL (ref 0.3–1.2)
Total Protein: 6.9 g/dL (ref 6.5–8.1)

## 2020-09-01 LAB — LIPASE, BLOOD: Lipase: 50 U/L (ref 11–51)

## 2020-09-01 LAB — URINALYSIS, ROUTINE W REFLEX MICROSCOPIC
Bilirubin Urine: NEGATIVE
Glucose, UA: NEGATIVE mg/dL
Hgb urine dipstick: NEGATIVE
Ketones, ur: NEGATIVE mg/dL
Leukocytes,Ua: NEGATIVE
Nitrite: NEGATIVE
Protein, ur: NEGATIVE mg/dL
Specific Gravity, Urine: 1.011 (ref 1.005–1.030)
pH: 5 (ref 5.0–8.0)

## 2020-09-01 LAB — CBC WITH DIFFERENTIAL/PLATELET
Abs Immature Granulocytes: 0 10*3/uL (ref 0.00–0.07)
Basophils Absolute: 0 10*3/uL (ref 0.0–0.1)
Basophils Relative: 1 %
Eosinophils Absolute: 0 10*3/uL (ref 0.0–0.5)
Eosinophils Relative: 1 %
HCT: 41.1 % (ref 39.0–52.0)
Hemoglobin: 13.8 g/dL (ref 13.0–17.0)
Immature Granulocytes: 0 %
Lymphocytes Relative: 30 %
Lymphs Abs: 1 10*3/uL (ref 0.7–4.0)
MCH: 31.5 pg (ref 26.0–34.0)
MCHC: 33.6 g/dL (ref 30.0–36.0)
MCV: 93.8 fL (ref 80.0–100.0)
Monocytes Absolute: 0.3 10*3/uL (ref 0.1–1.0)
Monocytes Relative: 9 %
Neutro Abs: 2 10*3/uL (ref 1.7–7.7)
Neutrophils Relative %: 59 %
Platelets: 167 10*3/uL (ref 150–400)
RBC: 4.38 MIL/uL (ref 4.22–5.81)
RDW: 12.1 % (ref 11.5–15.5)
WBC: 3.4 10*3/uL — ABNORMAL LOW (ref 4.0–10.5)
nRBC: 0 % (ref 0.0–0.2)

## 2020-09-01 MED ORDER — APIXABAN 5 MG PO TABS
5.0000 mg | ORAL_TABLET | Freq: Two times a day (BID) | ORAL | Status: DC
  Administered 2020-09-02 (×2): 5 mg via ORAL
  Filled 2020-09-01 (×3): qty 1

## 2020-09-01 MED ORDER — LISINOPRIL 20 MG PO TABS
20.0000 mg | ORAL_TABLET | Freq: Every day | ORAL | Status: DC
  Administered 2020-09-02 – 2020-09-04 (×3): 20 mg via ORAL
  Filled 2020-09-01 (×3): qty 1

## 2020-09-01 MED ORDER — ACETAMINOPHEN 650 MG RE SUPP: 650.0000 mg | Freq: Four times a day (QID) | RECTAL | Status: DC | PRN

## 2020-09-01 MED ORDER — DIFLUPREDNATE 0.05 % OP EMUL
1.0000 [drp] | Freq: Two times a day (BID) | OPHTHALMIC | Status: DC
  Administered 2020-09-03: 1 [drp] via OPHTHALMIC

## 2020-09-01 MED ORDER — MORPHINE SULFATE (PF) 4 MG/ML IV SOLN
4.0000 mg | INTRAVENOUS | Status: DC | PRN
  Administered 2020-09-01 – 2020-09-02 (×2): 4 mg via INTRAVENOUS
  Filled 2020-09-01 (×2): qty 1

## 2020-09-01 MED ORDER — KETOROLAC TROMETHAMINE 0.5 % OP SOLN
1.0000 [drp] | Freq: Three times a day (TID) | OPHTHALMIC | Status: DC
  Administered 2020-09-02 – 2020-09-03 (×7): 1 [drp] via OPHTHALMIC
  Filled 2020-09-01: qty 5

## 2020-09-01 MED ORDER — DICLOFENAC SODIUM 1 % EX GEL
4.0000 g | Freq: Three times a day (TID) | CUTANEOUS | Status: DC | PRN
  Administered 2020-09-02 – 2020-09-03 (×2): 4 g via TOPICAL
  Filled 2020-09-01: qty 100

## 2020-09-01 MED ORDER — FINASTERIDE 5 MG PO TABS
5.0000 mg | ORAL_TABLET | Freq: Every day | ORAL | Status: DC
  Administered 2020-09-02 – 2020-09-04 (×3): 5 mg via ORAL
  Filled 2020-09-01 (×3): qty 1

## 2020-09-01 MED ORDER — BRINZOLAMIDE 1 % OP SUSP
1.0000 [drp] | Freq: Two times a day (BID) | OPHTHALMIC | Status: DC
  Administered 2020-09-02: 1 [drp] via OPHTHALMIC
  Filled 2020-09-01: qty 10

## 2020-09-01 MED ORDER — LACTATED RINGERS IV SOLN: INTRAVENOUS | Status: AC

## 2020-09-01 MED ORDER — ATENOLOL 25 MG PO TABS
25.0000 mg | ORAL_TABLET | Freq: Every day | ORAL | Status: DC
  Administered 2020-09-03 – 2020-09-04 (×2): 25 mg via ORAL
  Filled 2020-09-01 (×3): qty 1

## 2020-09-01 MED ORDER — ONDANSETRON HCL 4 MG/2ML IJ SOLN: 4.0000 mg | Freq: Four times a day (QID) | INTRAMUSCULAR | Status: DC | PRN

## 2020-09-01 MED ORDER — ACETAMINOPHEN 325 MG PO TABS
650.0000 mg | ORAL_TABLET | Freq: Four times a day (QID) | ORAL | Status: DC | PRN
  Administered 2020-09-02 (×2): 650 mg via ORAL
  Filled 2020-09-01 (×2): qty 2

## 2020-09-01 MED ORDER — TAMSULOSIN HCL 0.4 MG PO CAPS
0.4000 mg | ORAL_CAPSULE | Freq: Two times a day (BID) | ORAL | Status: DC
  Administered 2020-09-02 – 2020-09-04 (×6): 0.4 mg via ORAL
  Filled 2020-09-01 (×6): qty 1

## 2020-09-01 MED ORDER — PIPERACILLIN-TAZOBACTAM 3.375 G IVPB
3.3750 g | Freq: Three times a day (TID) | INTRAVENOUS | Status: DC
  Administered 2020-09-02 – 2020-09-04 (×7): 3.375 g via INTRAVENOUS
  Filled 2020-09-01 (×6): qty 50

## 2020-09-01 MED ORDER — ONDANSETRON HCL 4 MG PO TABS: 4.0000 mg | ORAL_TABLET | Freq: Four times a day (QID) | ORAL | Status: DC | PRN

## 2020-09-01 MED ORDER — BRIMONIDINE TARTRATE 0.2 % OP SOLN
1.0000 [drp] | Freq: Two times a day (BID) | OPHTHALMIC | Status: DC
  Administered 2020-09-02 – 2020-09-03 (×4): 1 [drp] via OPHTHALMIC
  Filled 2020-09-01: qty 5

## 2020-09-01 MED ORDER — FENTANYL CITRATE (PF) 100 MCG/2ML IJ SOLN
25.0000 ug | Freq: Once | INTRAMUSCULAR | Status: AC
  Administered 2020-09-01: 25 ug via INTRAVENOUS
  Filled 2020-09-01: qty 2

## 2020-09-01 MED ORDER — IOHEXOL 300 MG/ML  SOLN
100.0000 mL | Freq: Once | INTRAMUSCULAR | Status: AC | PRN
  Administered 2020-09-01: 100 mL via INTRAVENOUS

## 2020-09-01 MED ORDER — HYDRALAZINE HCL 20 MG/ML IJ SOLN
10.0000 mg | Freq: Four times a day (QID) | INTRAMUSCULAR | Status: DC | PRN
  Administered 2020-09-01: 10 mg via INTRAVENOUS
  Filled 2020-09-01: qty 1

## 2020-09-01 MED ORDER — PIPERACILLIN-TAZOBACTAM 3.375 G IVPB 30 MIN
3.3750 g | Freq: Once | INTRAVENOUS | Status: AC
  Administered 2020-09-01: 3.375 g via INTRAVENOUS
  Filled 2020-09-01: qty 50

## 2020-09-01 MED ORDER — MORPHINE SULFATE (PF) 2 MG/ML IV SOLN
2.0000 mg | INTRAVENOUS | Status: DC | PRN
  Administered 2020-09-02 – 2020-09-03 (×6): 2 mg via INTRAVENOUS
  Filled 2020-09-01 (×6): qty 1

## 2020-09-01 NOTE — Progress Notes (Signed)
Pharmacy Antibiotic Note  Chad Guerra is a 70 y.o. male admitted on 09/01/2020 with scrotal and abdominal pain. Pharmacy has been consulted to dose zosyn for intra-abdominal infection  Plan: Zosyn 3.375g IV Q8H infused over 4hrs. Pharmacy will sign off and follow peripherally     Temp (24hrs), Avg:97.8 F (36.6 C), Min:97.8 F (36.6 C), Max:97.8 F (36.6 C)  Recent Labs  Lab 09/01/20 1624  WBC 3.4*  CREATININE 1.10    CrCl cannot be calculated (Unknown ideal weight.).    Allergies  Allergen Reactions  . Contrast Media [Iodinated Diagnostic Agents] Nausea Only  . Percocet [Oxycodone-Acetaminophen] Other (See Comments)    Shakes, chills, can tolerate tylenol     Thank you for allowing pharmacy to be a part of this patient's care.  Dolly Rias RPh 09/01/2020, 10:49 PM

## 2020-09-01 NOTE — H&P (Signed)
History and Physical    Chad Guerra YSA:[id number] DOB: 1951-03-13 DOA: 09/01/2020  PCP: [name], MD  Patient coming from: Home   Chief Complaint:  Chief Complaint  Patient presents with  . Groin Swelling     HPI:    70 year old male with past medical history of paroxysmal atrial fibrillation (on Eliquis), hypertension, benign prostatic hyperplasia and frequent bouts of diverticulitis in the past (2010, 2013, 2015, 2020) who presents to [facility] emergency department with complaints of abdominal and scrotal pain.  Patient explains that over at least the past 2 weeks he has been experiencing abdominal pain.  Patient describes this abdominal pain as sharp in quality, severe in intensity and primarily located in the left lower quadrant.  Pain is worse with movement and improved with rest and seems to radiate to his scrotum.  Patient is also concurrently been complaining of scrotal swelling and has been ongoing for what sounds to be at least several months.  This results in ongoing scrotal discomfort and even difficulty with ambulation at times.  [facility], patient is followed up with urology concerning this in the past.    Patient's lower abdominal pain has continued to worsen daily since onset, leading the patient to eventually present to [facility] emergency department for evaluation.  Upon evaluation in the emergency department CT imaging of the abdomen pelvis revealed evidence of acute sigmoid colon diverticulitis with a small amount of extraluminal air identified concerning for microperforation.  Patient has been initiated on intravenous Zosyn.  Patient has been initiated on intravenous fluids.  ER provider discussed case with Dr. Johney Maine with general surgery who has graciously come to evaluate the patient at the bedside.  He is recommending initial hospitalization and initiation of antibiotic therapy with surgery to follow.  The  hospitalist group is now been called to assist patient for admission to the hospital.  Review of Systems:   Review of Systems  Gastrointestinal: Positive for abdominal pain.  All other systems reviewed and are negative.   Past Medical History:  Diagnosis Date  . Bilateral hydrocele 09/01/2020  . Hypertension   . Medical history non-contributory   . Paroxysmal atrial fibrillation ([place]) 09/01/2020   Mar 26, 2018 Entered By: [name] L Comment: Holter 2019  . Primary open angle glaucoma 09/01/2020    Past Surgical History:  Procedure Laterality Date  . ANTERIOR CERVICAL DECOMP/DISCECTOMY FUSION N/A 08/25/2012   Procedure: ANTERIOR CERVICAL DECOMPRESSION/DISCECTOMY FUSION 1 LEVEL (ACDF C5-6);  Surgeon: Melina Schools, MD;  Location: [facility];  Service: Orthopedics;  Laterality: N/A;  . ANTERIOR FUSION CERVICAL SPINE  08/25/2012   Dr Rolena Infante  . CHOLECYSTECTOMY N/A 07/04/2012   Procedure: LAPAROSCOPIC CHOLECYSTECTOMY WITH INTRAOPERATIVE CHOLANGIOGRAM;  Surgeon: Pedro Earls, MD;  Location: [facility];  Service: General;  Laterality: N/A;  . HERNIA REPAIR    . KNEE ARTHROSCOPY W/ ACL RECONSTRUCTION     LEFT  . ROTATOR CUFF REPAIR       reports that he has never smoked. He has never used smokeless tobacco. He reports that he does not drink alcohol and does not use drugs.  Allergies  Allergen Reactions  . Contrast Media [Iodinated Diagnostic Agents] Nausea Only  . Percocet [Oxycodone-Acetaminophen] Other (See Comments)    Shakes, chills, can tolerate tylenol    Family History  Problem Relation Age of Onset  . Heart disease Neg Hx      Prior to Admission medications   Medication Sig Start  Date End Date Taking? Authorizing Provider  Aflibercept 2 MG/0.05ML SOLN Inject 2 mg into the eye once. Left eye 08/21/20  Yes [provider]  apixaban (ELIQUIS) 5 MG TABS tablet Take 5 mg by mouth 2 (two) times daily. 01/23/20  Yes [provider]  atenolol (TENORMIN) 25 MG  tablet Take 25 tablets by mouth daily. 06/06/20  Yes [provider]  Brinzolamide-Brimonidine 1-0.2 % SUSP Place 1 drop into both eyes 2 (two) times daily. 03/10/19  Yes [provider]  cholecalciferol (VITAMIN D) 1000 UNITS tablet Take 1,000 Units by mouth daily.   Yes [provider]  diclofenac Sodium (VOLTAREN) 1 % GEL Apply 4 g topically 2 (two) times daily as needed (pain). 08/17/20  Yes [provider]  Difluprednate 0.05 % EMUL Place 1 drop into the left eye 2 (two) times daily. 07/24/20  Yes [provider]  finasteride (PROSCAR) 5 MG tablet Take 5 mg by mouth daily. 06/06/20  Yes [provider]  furosemide (LASIX) 20 MG tablet Take 10 mg by mouth daily as needed for fluid. 01/23/20  Yes [provider]  HYDROcodone-acetaminophen (NORCO/VICODIN) 5-325 MG per tablet Take 1-2 tablets by mouth every 6 hours as needed for pain. Patient taking differently: Take 1 tablet by mouth every 6 (six) hours as needed for moderate pain. 03/23/14  Yes Pisciotta, Elmyra Ricks, PA-C  ketorolac (ACULAR) 0.5 % ophthalmic solution Place 1 drop into the left eye 3 (three) times daily. 04/06/20  Yes [provider]  lisinopril (PRINIVIL,ZESTRIL) 20 MG tablet Take 20 mg by mouth daily.   Yes [provider]  tamsulosin (FLOMAX) 0.4 MG CAPS capsule Take 0.4 mg by mouth 2 (two) times daily. 02/28/19  Yes [provider]  ciprofloxacin (CIPRO) 500 MG tablet Take 1 tablet (500 mg total) by mouth every 12 (twelve) hours. Patient not taking: Reported on 09/01/2020 03/23/14   Pisciotta, Elmyra Ricks, PA-C  latanoprost (XALATAN) 0.005 % ophthalmic solution Place 1 drop into both eyes at bedtime. Patient not taking: Reported on 09/01/2020 03/10/19   [provider]  levofloxacin (LEVAQUIN) 500 MG tablet Take 1 tablet (500 mg total) by mouth daily. Patient not taking: Reported on 09/01/2020 08/18/18   Vanessa Kick, MD  metroNIDAZOLE (FLAGYL) 500  MG tablet Take 1 tablet (500 mg total) by mouth 2 (two) times daily. One tab PO bid x 14 days Patient not taking: Reported on 09/01/2020 03/23/14   Pisciotta, Elmyra Ricks, PA-C  Multiple Vitamin (MULTIVITAMIN WITH MINERALS) TABS Take 1 tablet by mouth daily. Patient not taking: Reported on 09/01/2020    [provider]    Physical Exam: Vitals:   09/01/20 1553 09/01/20 1719 09/01/20 1800 09/01/20 2037  BP: (!) 182/97 (!) 159/94 (!) 173/93 (!) 156/91  Pulse: 61 (!) 52 (!) 50 (!) 54  Resp: 18 15  18   Temp: 97.8 F (36.6 C)     SpO2: 100% 100% 97% 98%    Constitutional: Awake alert and oriented x3, patient is in mild distress due to abdominal pain. Skin: no rashes, no lesions, good skin turgor noted. Eyes: Pupils are equally reactive to light.  No evidence of scleral icterus or conjunctival pallor.  ENMT: Moist mucous membranes noted.  Posterior pharynx clear of any exudate or lesions.   Neck: normal, supple, no masses, no thyromegaly.  No evidence of jugular venous distension.   Respiratory: clear to auscultation bilaterally, no wheezing, no crackles. Normal respiratory effort. No accessory muscle use.  Cardiovascular: Regular rate and rhythm,  no murmurs / rubs / gallops. No extremity edema. 2+ pedal pulses. No carotid bruits.  Chest:   Nontender without crepitus or deformity.   Back:   Nontender without crepitus or deformity. Abdomen: Abdomen is soft with notable left upper and left lower quadrant tenderness..  No evidence of intra-abdominal masses.  Positive bowel sounds noted in all quadrants.   Musculoskeletal: No joint deformity upper and lower extremities. Good ROM, no contractures. Normal muscle tone.  Neurologic: CN 2-12 grossly intact. Sensation intact.  Patient moving all 4 extremities spontaneously.  Patient is following all commands.  Patient is responsive to verbal stimuli.   Psychiatric: Patient exhibits normal mood with appropriate affect.  Patient seems to possess insight  as to their current situation.     Labs on Admission: I have personally reviewed following labs and imaging studies -   CBC: Recent Labs  Lab 09/01/20 1624  WBC 3.4*  NEUTROABS 2.0  HGB 13.8  HCT 41.1  MCV 93.8  PLT 440   Basic Metabolic Panel: Recent Labs  Lab 09/01/20 1624  NA 136  K 3.6  CL 107  CO2 24  GLUCOSE 96  BUN 15  CREATININE 1.10  CALCIUM 8.5*   GFR: CrCl cannot be calculated (Unknown ideal weight.). Liver Function Tests: Recent Labs  Lab 09/01/20 1624  AST 17  ALT 16  ALKPHOS 89  BILITOT 1.0  PROT 6.9  ALBUMIN 3.9   Recent Labs  Lab 09/01/20 1624  LIPASE 50   No results for input(s): AMMONIA in the last 168 hours. Coagulation Profile: No results for input(s): INR, PROTIME in the last 168 hours. Cardiac Enzymes: No results for input(s): CKTOTAL, CKMB, CKMBINDEX, TROPONINI in the last 168 hours. BNP (last 3 results) No results for input(s): PROBNP in the last 8760 hours. HbA1C: No results for input(s): HGBA1C in the last 72 hours. CBG: No results for input(s): GLUCAP in the last 168 hours. Lipid Profile: No results for input(s): CHOL, HDL, LDLCALC, TRIG, CHOLHDL, LDLDIRECT in the last 72 hours. Thyroid Function Tests: No results for input(s): TSH, T4TOTAL, FREET4, T3FREE, THYROIDAB in the last 72 hours. Anemia Panel: No results for input(s): VITAMINB12, FOLATE, FERRITIN, TIBC, IRON, RETICCTPCT in the last 72 hours. Urine analysis:    Component Value Date/Time   COLORURINE YELLOW 09/01/2020 1624   APPEARANCEUR CLEAR 09/01/2020 1624   LABSPEC 1.011 09/01/2020 1624   PHURINE 5.0 09/01/2020 1624   GLUCOSEU NEGATIVE 09/01/2020 1624   HGBUR NEGATIVE 09/01/2020 1624   BILIRUBINUR NEGATIVE 09/01/2020 1624   KETONESUR NEGATIVE 09/01/2020 1624   PROTEINUR NEGATIVE 09/01/2020 1624   UROBILINOGEN 0.2 01/27/2014 2149   NITRITE NEGATIVE 09/01/2020 1624   LEUKOCYTESUR NEGATIVE 09/01/2020 1624    Radiological Exams on Admission - Personally  Reviewed: CT ABDOMEN PELVIS W CONTRAST  Result Date: 09/01/2020 CLINICAL DATA:  Acute, diffuse abdominal pain and scrotal swelling. Lower pelvic swelling. Previous cholecystectomy and hernia repair. EXAM: CT ABDOMEN AND PELVIS WITH CONTRAST TECHNIQUE: Multidetector CT imaging of the abdomen and pelvis was performed using the standard protocol following bolus administration of intravenous contrast. CONTRAST:  186mL OMNIPAQUE IOHEXOL 300 MG/ML  SOLN COMPARISON:  08/30/2018 FINDINGS: Lower chest: Enlarged heart, including biatrial and left ventricular enlargement. Clear lung bases. Hepatobiliary: Mild diffuse low density of the liver relative to the spleen. Cholecystectomy clips. Pancreas: Mild to moderate diffuse atrophy. Spleen: Normal in size without focal abnormality. Adrenals/Urinary Tract: Normal appearing adrenal glands. Small lower pole left renal cyst. Unremarkable ureters and urinary bladder. Stomach/Bowel: Multiple  colonic diverticula containing high-density material. Interval soft tissue stranding involving the proximal sigmoid colon in an area of diverticula, with a tiny extraluminal collection of air with no extraluminal fluid collections. Normal appearing appendix containing high density material or a partially calcified appendicolith distally. Small hiatal hernia. Unremarkable small bowel. Vascular/Lymphatic: Atheromatous arterial calcifications without aneurysm. Interval inclusion of a mildly enlarged left inguinal lymph node with minimal adjacent soft tissue stranding. This is only partially included previously and currently has a short axis diameter of 13 mm on image number 95 series 2. This is unchanged since 03/23/2014. A mildly enlarged right inguinal lymph node without adjacent soft tissue stranding has a short axis diameter of 9 mm on image number 96 series 2, previously 10 mm and unchanged since 03/23/2014. Enlarged right common femoral node has not changed significantly, with a short axis  diameter of 20 mm on image number 75 series 2. This is also stable since 03/23/2014. Reproductive: Moderately enlarged prostate gland. Other: Small bilateral inguinal hernias containing fat. Bilateral vas deferens clips. Bilateral hydroceles. Musculoskeletal: No acute bony abnormality. IMPRESSION: 1. Acute sigmoid colon diverticulitis with a tiny extraluminal collection of air without abscess. 2. Extensive colonic diverticulosis. 3. Small hiatal hernia. 4. Mild diffuse hepatic steatosis. 5. Moderately enlarged prostate gland. 6. Small bilateral inguinal hernias containing fat. 7. Bilateral hydroceles. 8. Stable mild bilateral inguinal and right common femoral adenopathy. The long-term stability is compatible with a benign process. 9. Cardiomegaly. Electronically Signed   By: Claudie Revering M.D.   On: 09/01/2020 19:13   US SCROTUM DOPPLER  Result Date: 09/01/2020 CLINICAL DATA:  Right scrotal pain and swelling for several months. EXAM: SCROTAL ULTRASOUND DOPPLER ULTRASOUND OF THE TESTICLES TECHNIQUE: Complete ultrasound examination of the testicles, epididymis, and other scrotal structures was performed. Color and spectral Doppler ultrasound were also utilized to evaluate blood flow to the testicles. COMPARISON:  None. FINDINGS: Right testicle Measurements: 4.3 x 3.3 x 3.2 cm. No mass or microlithiasis visualized. Left testicle Measurements: 4.4 x 2.9 x 3.4 cm. No mass or microlithiasis visualized. 7 mm calculus along the left posterior testicular wall. Right epididymis: Normal in size and appearance. 1.2 cm right epididymal cyst noted. Left epididymis:  Normal in size and appearance. Hydrocele:  Small right hydrocele. Varicocele:  None visualized. Pulsed Doppler interrogation of both testes demonstrates normal low resistance arterial and venous waveforms bilaterally. IMPRESSION: 1. Normal appearance of the testicles. 2. 1.2 cm right epididymal cyst. 3. Small right hydrocele. Electronically Signed   By: Fidela Salisbury M.D.   On: 09/01/2020 18:46    EKG: Personally reviewed.  Rhythm is sinus bradycardia with heart rate of 59 bpm.  No dynamic ST segment changes appreciated.  Assessment/Plan Principal Problem:   Diverticulitis of large intestine with perforation without abscess or bleeding   Patient presenting with several week history of progressively worsening left lower quadrant pain  CT imaging concerning for acute diverticulitis with small amount of free air concerning for concurrent microperforation.  Patient is been placed on intravenous Zosyn for empiric antibiotic therapy  Patient has been placed on clear liquid diet  Gentle intravenous hydration  As needed opiate-based analgesics for substantial associated pain  Dr. Johney Maine with general surgery has evaluated the patient at the bedside, his input is appreciated.  He recommends intravenous antibiotics, serial abdominal exams over the next several days and follow-up CT scan in 5 days if patient fails to clinically improve to identify any development of abscess formation.  Patient has experienced frequent episodes  of diverticulitis in the past needing consideration should be undertaken for colonic resection, perhaps 6 weeks from now per surgery's note.  Active Problems:   Hypertensive urgency   Patient exhibiting markedly elevated blood pressures likely exacerbated by presence of severe abdominal pain.  Resuming home antihypertensive regimen.  This will need to be titrated throughout the hospitalization to achieve improved blood pressure control.  As needed antihypertensives for markedly elevated blood pressures.  Please see comments on possible pheochromocytoma below    Adenoma of left adrenal gland   Mention of "mild nodular thickening of the bilateral adrenal glands with left-sided adrenal adenoma" [facility] on [facility]5 with mention of plan to proceed with pheochromocytoma work-up.  That being said, patient symptoms are  not consistent with pheochromocytoma and CT imaging of the abdomen pelvis here did not redemonstrate this adrenal enlargement or adrenal adenoma.    BPH (benign prostatic hyperplasia)   Continue home regimen of Proscar and Flomax    Paroxysmal atrial fibrillation ([facility])   Continue home regimen of Eliquis  Continue home regimen of atenolol  Monitoring patient on telemetry  Currently rate controlled and in normal sinus rhythm    Primary open angle glaucoma   Continuing home regimen of eyedrops    Bilateral hydrocele   Patient complaining of testicular swelling fullness and occasional difficulty with ambulation.  [facility] outpatient note on 4/5 states the patient has been evaluated by urology for this in the past and is being referred to them once again for repeat evaluation.   CT imaging of the abdomen and pelvis on this presentation reveals bilateral hydrocele  Code Status:  Full code Family Communication: Wife is at bedside who has been updated on plan of care.  Status is: Observation  The patient remains OBS appropriate and will d/c before 2 midnights.  Dispo: The patient is from: Home              Anticipated d/c is to: Home              Patient currently is not medically stable to d/c.   Difficult to place patient No        Vernelle Emerald MD [facility] Pager [phone]  If 7PM-7AM, please contact night-coverage [email or web address] Use universal [place] password for that web site. If you do not have the password, please call the hospital operator.  09/01/2020, 11:03 PM

## 2020-09-01 NOTE — ED Triage Notes (Signed)
Patient reports pain and swelling to scrotum x weeks with lower abdominal pain x a few days.

## 2020-09-01 NOTE — ED Provider Notes (Signed)
[facility] DEPT Provider Note   CSN: [id number] Arrival date & time: 09/01/20  1542     History Chief Complaint  Patient presents with  . Groin Swelling    Chad Guerra is a 70 y.o. male.  HPI   Patient is a 70 year old male who presents the emergency department due to scrotal pain and abdominal pain.  Patient states that he has been dealing with intermittent scrotal pain for many years.  He states he was evaluated by urology at the [facility] for this and prescribed finasteride as well as Flomax.  He states 4 days ago that his pain started worsening with associated swelling on the right side.  He then began experiencing diffuse pain across the abdomen.  He reports some mild dysuria that started about 1 month ago.  Also complains of an episode of bloody semen that occurred about 3 months ago.  No recent occurrences.  No nausea, vomiting, diarrhea.  No chest pain or shortness of breath.  He states he has been married and in a monogamous relationship for more than 30 years.  He has an appointment for reevaluation with urology at the [facility] in June of this year.     Past Medical History:  Diagnosis Date  . Hypertension   . Medical history non-contributory     Patient Active Problem List   Diagnosis Date Noted  . S/P laparoscopic cholecystectomy Feb 2014 07/04/2012    Past Surgical History:  Procedure Laterality Date  . ANTERIOR CERVICAL DECOMP/DISCECTOMY FUSION N/A 08/25/2012   Procedure: ANTERIOR CERVICAL DECOMPRESSION/DISCECTOMY FUSION 1 LEVEL (ACDF C5-6);  Surgeon: Melina Schools, MD;  Location: [facility];  Service: Orthopedics;  Laterality: N/A;  . ANTERIOR FUSION CERVICAL SPINE  08/25/2012   Dr Rolena Infante  . CHOLECYSTECTOMY N/A 07/04/2012   Procedure: LAPAROSCOPIC CHOLECYSTECTOMY WITH INTRAOPERATIVE CHOLANGIOGRAM;  Surgeon: Pedro Earls, MD;  Location: [facility];  Service: General;  Laterality: N/A;  . HERNIA REPAIR    . KNEE ARTHROSCOPY W/ ACL RECONSTRUCTION      LEFT  . ROTATOR CUFF REPAIR         No family history on file.  Social History   Tobacco Use  . Smoking status: Never Smoker  . Smokeless tobacco: Never Used  Substance Use Topics  . Alcohol use: No  . Drug use: No    Home Medications Prior to Admission medications   Medication Sig Start Date End Date Taking? Authorizing Provider  cholecalciferol (VITAMIN D) 1000 UNITS tablet Take 1,000 Units by mouth daily.    [provider]  ciprofloxacin (CIPRO) 500 MG tablet Take 1 tablet (500 mg total) by mouth every 12 (twelve) hours. 03/23/14   Pisciotta, Elmyra Ricks, PA-C  HYDROcodone-acetaminophen (NORCO/VICODIN) 5-325 MG per tablet Take 1-2 tablets by mouth every 6 hours as needed for pain. 03/23/14   Pisciotta, Elmyra Ricks, PA-C  levofloxacin (LEVAQUIN) 500 MG tablet Take 1 tablet (500 mg total) by mouth daily. 08/18/18   Vanessa Kick, MD  lisinopril (PRINIVIL,ZESTRIL) 20 MG tablet Take 20 mg by mouth daily.    [provider]  methocarbamol (ROBAXIN) 500 MG tablet Take 500 mg by mouth daily as needed for muscle spasms (muscle spasms).    [provider]  metroNIDAZOLE (FLAGYL) 500 MG tablet Take 1 tablet (500 mg total) by mouth 2 (two) times daily. One tab PO bid x 14 days 03/23/14   Pisciotta, Elmyra Ricks, PA-C  Multiple Vitamin (MULTIVITAMIN WITH MINERALS) TABS Take 1 tablet by mouth daily.  [provider]    Allergies    Percocet [oxycodone-acetaminophen]  Review of Systems   Review of Systems  All other systems reviewed and are negative. Ten systems reviewed and are negative for acute change, except as noted in the HPI.   Physical Exam Updated Vital Signs BP (!) 159/94 (BP Location: Right Arm)   Pulse (!) 52   Temp 97.8 F (36.6 C)   Resp 15   SpO2 100%   Physical Exam Vitals and nursing note reviewed. Exam conducted with a chaperone present.  Constitutional:      General: He is not in acute distress.    Appearance: Normal appearance. He  is not ill-appearing, toxic-appearing or diaphoretic.  HENT:     Head: Normocephalic and atraumatic.     Right Ear: External ear normal.     Left Ear: External ear normal.     Nose: Nose normal.     Mouth/Throat:     Mouth: Mucous membranes are moist.     Pharynx: Oropharynx is clear. No oropharyngeal exudate or posterior oropharyngeal erythema.  Eyes:     General: No scleral icterus.       Right eye: No discharge.        Left eye: No discharge.     Extraocular Movements: Extraocular movements intact.     Conjunctiva/sclera: Conjunctivae normal.  Cardiovascular:     Rate and Rhythm: Normal rate and regular rhythm.     Pulses: Normal pulses.     Heart sounds: Normal heart sounds. No murmur heard. No friction rub. No gallop.   Pulmonary:     Effort: Pulmonary effort is normal. No respiratory distress.     Breath sounds: Normal breath sounds. No stridor. No wheezing, rhonchi or rales.  Abdominal:     General: Abdomen is flat.     Palpations: Abdomen is soft.     Tenderness: There is abdominal tenderness.     Comments: Protuberant abdomen that is soft.  Moderate tenderness noted diffusely across the abdomen that appears to be worst along the periumbilical and suprapubic region.  Genitourinary:    Comments: Chaperone present.  Right-sided testicular swelling and tenderness.  Additional tenderness noted along the right epididymis.  No overlying erythema or skin changes noted.  No penile discharge. Musculoskeletal:        General: Normal range of motion.     Cervical back: Normal range of motion and neck supple. No tenderness.  Skin:    General: Skin is warm and dry.  Neurological:     General: No focal deficit present.     Mental Status: He is alert and oriented to person, place, and time.  Psychiatric:        Mood and Affect: Mood normal.        Behavior: Behavior normal.    ED Results / Procedures / Treatments   Labs (all labs ordered are listed, but only abnormal results are  displayed) Labs Reviewed  COMPREHENSIVE METABOLIC PANEL  CBC WITH DIFFERENTIAL/PLATELET  URINALYSIS, ROUTINE W REFLEX MICROSCOPIC  LIPASE, BLOOD   EKG None  Radiology No results found.  Procedures Procedures   Medications Ordered in ED Medications  fentaNYL (SUBLIMAZE) injection 25 mcg (has no administration in time range)    ED Course  I have reviewed the triage vital signs and the nursing notes.  Pertinent labs & imaging results that were available during my care of the patient were reviewed by me and considered in my medical decision making (see chart  for details).    MDM Rules/Calculators/A&P                          Pt is a 70 y.o. male who presents the emergency department with diffuse abdominal pain, right-sided scrotal pain and swelling, as well as dysuria.  Labs: CBC is pending. Lipase is pending. UA is pending. CMP is pending.  Imaging: CT scan of the abdomen and pelvis with contrast is pending. Ultrasound of the scrotum is pending.  I, Rayna Sexton, PA-C, personally reviewed and evaluated these images and lab results as part of my medical decision-making.  Unsure of the source of the patient's abdominal pain.  He has having no nausea, vomiting, diarrhea.  Previous surgery to the abdomen including previous hernia repair.  He appears to have moderate tenderness diffusely across the abdomen.  Will obtain a CT scan with contrast as well as basic labs.  Patient also having dysuria as well as right-sided scrotal pain and swelling.  Also notes an episode of hematospermia about 3 months ago.  Possible orchitis versus epididymitis.  He has been married and in a monogamous relationship for more than 30 years.  Doubt it is secondary to STI given his age and sexual history.  He has follow-up with urology in June with the [place].  It is in my shift and patient care is being transferred to [facility].  Disposition pending results of imaging and lab work.  Note:  Portions of this report may have been transcribed using voice recognition software. Every effort was made to ensure accuracy; however, inadvertent computerized transcription errors may be present.   Final Clinical Impression(s) / ED Diagnoses Final diagnoses:  Testicle pain  Generalized abdominal pain   Rx / DC Orders ED Discharge Orders    None       Rayna Sexton, PA-C 09/01/20 1733    Lacretia Leigh, MD 09/06/20 1113

## 2020-09-01 NOTE — Consult Note (Addendum)
Chad Guerra  06/29/1950 [id number]  CARE TEAM:  PCP: [name], MD  Outpatient Care Team: Patient Care Team: [name], MD as PCP - General (Internal Medicine) Melina Schools, MD as Consulting Physician (Orthopedic Surgery) Johnathan Hausen, MD as Consulting Physician (General Surgery) Neill Loft, Halina Maidens, MD as Referring Physician (Ophthalmology) Gatha Mayer, MD (Hematology and Oncology)  Inpatient Treatment Team: Treatment Team: Attending Provider: Lacretia Leigh, MD; Physician Assistant: Dyann Ruddle; Registered Nurse: Syliva Overman, RN; Consulting Physician: Edison Pace, Md, MD   This patient is a 70 y.o.male who presents today for surgical evaluation at the request of Loni Beckwith, PA-C. At [facility] ED  Chief complaint / Reason for evaluation: Recurrent sigmoid diverticulitis  Pleasant obese male.  Gets his care mainly at the [place].  Chronically anticoagulated on Eliquis.  He is not certain why but I believe has chronic atrial fibrillation.  Records not easily available.  He is to be [employer] now [place].  History of diverticulitis documented on prior CAT scans going as far back as 2010, possibly sooner.  Patient had left-sided abdominal pain and discomfort.  Worsening intensity.  Grew concerned.  Swelling and discomfort.  CAT scan shows evidence of significant sigmoid diverticulosis with a focus of diverticulitis and perforation.  Surgical consultation requested.  Patient notes he moves his bowels about 5 times a day.  History of adenomatous colon polyps.  He thinks he is overdue for a colonoscopy in the past year or so.  Hypertension.  Question of a left adrenal nodule undergoing endocrine work-up.  Does have some hypertension.  No major nodule noted on today's CAT scan.  Small hiatal hernia.  Patient had an umbilical hernia that was repaired with mesh that later required removal and primary repair.  He can feel  stitches at his bellybutton.  Area thinned out but no definite recurrent hernia there.  No evidence of any bowel obstruction.  Does have some prostatism denies any major urinary difficulty.  Has had some discomfort in his right groin going down to the scrotum as well.  Patient notes he has been having some lower extremity foot and knee issues and is considering orthopedic surgery for that as well as podiatry   Assessment  Chad Guerra  70 y.o. male       Problem List:  Principal Problem:   Perforation of sigmoid colon due to diverticulitis Active Problems:   History of colonic diverticulitis   Adenoma of left adrenal gland   BPH (benign prostatic hyperplasia)   Acute diverticulitis   Chronic anticoagulation   Recurrent periumbilical incisional hernia s/p repair x2    Bilateral inguinal hernia (BIH)   Obesity (BMI 30-39.9)   Recurrent sigmoid diverticulitis.  This is at least his fourth attack now with perforation and abdominal pain.  Plan:  Admission  IV antibiotics.  Serial exams.  Follow-up CT scan in 5 days if not improved to rule out delayed abscess.  Chronic neutropenia followed by medical oncology/hematology at the [facility] center.  Chronically anticoagulated for uncertain reason.  I suspect it is chronic atrial fibrillation.  Consider EKG medical evaluation.  Because of his recurrent episodes of diverticulitis (2010, 2013, 2015, 2020, 2022) and now with a more complicated attack, I think he should consider segmental colonic resection.  He be a reasonable candidate for robotic sigmoid colectomy with anastomosis 6 weeks from now.  Allow things to calm down and then consider a colonoscopy the day before surgery as well  to rule out any cancer or tumor (overdue for colonoscopy)  His operative risk may be increased from his prior hernia repairs with mesh.  We definitely want medical and probably cardiac clearance as well.  [facility] not [facility].  I sense that the [place]  tends to send out the complicated colorectal stuff in the private sector which we can handle if they desire.  No need for emergency surgery tonight.  Surgery will help follow.  Question of a left adrenal nodule undergoing endocrine work-up.  Would want to make sure there is no evidence of any pheochromocytoma before considering surgery.  Does not seem too likely but see if we could follow-up on those records to be static.  Mild inguinal hernias but no evidence of any incarceration.  Questionable mild lymphadenopathy but this appears to be stable from prior CAT scans.  Large prostate suspicious for prostatism but no major symptoms.  -VTE prophylaxis- SCDs, etc -mobilize as tolerated to help recovery  40 minutes spent in review, evaluation, examination, counseling, and coordination of care.  More than 50% of that time was spent in counseling.  Adin Hector, MD, FACS, MASCRS  Esophageal, Gastrointestinal & Colorectal Surgery Robotic and Minimally Invasive Surgery [facility] [address], [place], [place] [address] [phone] Fax [phone] Main/Paging  CONTACT INFORMATION: Weekday (9AM-5PM) concerns: Call [facility] main office at [phone] Weeknight (5PM-9AM) or Weekend/Holiday concerns: Check [email or web address] for General Surgery [facility] coverage (Please, do not use SecureChat as it is not reliable communication to operating surgeons for immediate patient care)      09/01/2020      Past Medical History:  Diagnosis Date  . Hypertension   . Medical history non-contributory     Past Surgical History:  Procedure Laterality Date  . ANTERIOR CERVICAL DECOMP/DISCECTOMY FUSION N/A 08/25/2012   Procedure: ANTERIOR CERVICAL DECOMPRESSION/DISCECTOMY FUSION 1 LEVEL (ACDF C5-6);  Surgeon: Melina Schools, MD;  Location: [facility];  Service: Orthopedics;  Laterality: N/A;  . ANTERIOR FUSION CERVICAL SPINE  08/25/2012   Dr Rolena Infante  . CHOLECYSTECTOMY N/A 07/04/2012    Procedure: LAPAROSCOPIC CHOLECYSTECTOMY WITH INTRAOPERATIVE CHOLANGIOGRAM;  Surgeon: Pedro Earls, MD;  Location: [facility];  Service: General;  Laterality: N/A;  . HERNIA REPAIR    . KNEE ARTHROSCOPY W/ ACL RECONSTRUCTION     LEFT  . ROTATOR CUFF REPAIR      Social History   Socioeconomic History  . Marital status: Married    Spouse name: Not on file  . Number of children: Not on file  . Years of education: Not on file  . Highest education level: Not on file  Occupational History  . Not on file  Tobacco Use  . Smoking status: Never Smoker  . Smokeless tobacco: Never Used  Substance and Sexual Activity  . Alcohol use: No  . Drug use: No  . Sexual activity: Not on file  Other Topics Concern  . Not on file  Social History Narrative  . Not on file   Social Determinants of Health   Financial Resource Strain: Not on file  Food Insecurity: Not on file  Transportation Needs: Not on file  Physical Activity: Not on file  Stress: Not on file  Social Connections: Not on file  Intimate Partner Violence: Not on file    No family history on file.  Current Facility-Administered Medications  Medication Dose Route Frequency Provider Last Rate Last Admin  . piperacillin-tazobactam (ZOSYN) IVPB 3.375 g  3.375 g Intravenous Once Loni Beckwith, PA-C       Current Outpatient Medications  Medication Sig Dispense Refill  . cholecalciferol (VITAMIN D) 1000 UNITS tablet Take 1,000 Units by mouth daily.    . ciprofloxacin (CIPRO) 500 MG tablet Take 1 tablet (500 mg total) by mouth every 12 (twelve) hours. 20 tablet 0  . HYDROcodone-acetaminophen (NORCO/VICODIN) 5-325 MG per tablet Take 1-2 tablets by mouth every 6 hours as needed for pain. 15 tablet 0  . levofloxacin (LEVAQUIN) 500 MG tablet Take 1 tablet (500 mg total) by mouth daily. 10 tablet 0  . lisinopril (PRINIVIL,ZESTRIL) 20 MG tablet Take 20 mg by mouth daily.    . methocarbamol (ROBAXIN) 500 MG tablet Take 500 mg by  mouth daily as needed for muscle spasms (muscle spasms).    . metroNIDAZOLE (FLAGYL) 500 MG tablet Take 1 tablet (500 mg total) by mouth 2 (two) times daily. One tab PO bid x 14 days 24 tablet 0  . Multiple Vitamin (MULTIVITAMIN WITH MINERALS) TABS Take 1 tablet by mouth daily.     Facility-Administered Medications Ordered in Other Encounters  Medication Dose Route Frequency Provider Last Rate Last Admin  . glycopyrrolate (ROBINUL) injection    PRN Janine Ores, CRNA   0.5 mg at 07/04/12 0915  . neostigmine (PROSTIGMINE) injection   Intravenous PRN Janine Ores, CRNA   4 mg at 07/04/12 0915  . ondansetron (ZOFRAN) injection    PRN Janine Ores, CRNA   2 mg at 07/04/12 0815     Allergies  Allergen Reactions  . Percocet [Oxycodone-Acetaminophen] Other (See Comments)    Shakes, chills, can tolerate tylenol    ROS:   All other systems reviewed & are negative except per HPI or as noted below: Constitutional:  No fevers, chills, sweats.  Weight stable Eyes:  No vision changes, No discharge HENT:  No sore throats, nasal drainage Lymph: No neck swelling, No bruising easily Pulmonary:  No cough, productive sputum CV: No orthopnea, PND  Patient walks 20 minutes for about 1/2 miles without difficulty.  No exertional chest/neck/shoulder/arm pain. GI:  No personal nor family history of GI/colon cancer, inflammatory bowel disease, irritable bowel syndrome, allergy such as Celiac Sprue, dietary/dairy problems, colitis, ulcers nor gastritis.  No recent sick contacts/gastroenteritis.  No travel outside the country.  No changes in diet. Renal: No UTIs, No hematuria Genital:  No drainage, bleeding, masses Musculoskeletal: No severe joint pain.  Good ROM major joints Skin:  No sores or lesions.  No rashes Heme/Lymph:  No easy bleeding.  No swollen lymph nodes Neuro: No focal weakness/numbness.  No seizures Psych: No suicidal ideation.  No hallucinations  BP (!) 156/91 (BP Location: Right  Arm)   Pulse (!) 54   Temp 97.8 F (36.6 C)   Resp 18   SpO2 98%   Physical Exam: Constitutional: Not cachectic.  Hygeine adequate.  Vitals signs as above.  Calm and in no major acute distress Eyes: Pupils reactive, normal extraocular movements. Sclera nonicteric Neuro: CN II-XII intact.  No major focal sensory defects.  No major motor deficits. Lymph: No head/neck/groin lymphadenopathy Psych:  No severe agitation.  No severe anxiety.  Judgment & insight Adequate, Oriented x4, HENT: Normocephalic, Mucus membranes moist.  No thrush.   Neck: Supple, No tracheal deviation.  No obvious thyromegaly Chest: No pain to chest wall compression.  Good respiratory excursion.  No audible wheezing CV:  Pulses intact.  Mostly regular rhythm.  No major extremity edema Abdomen:  Soft.  Moderately distended.  Tenderness at [facility] with some mild peritoneal signs.  Right abdomen soft and nontender.  I can feel stitches underneath his thin umbilicus is suspicious for prior hernia repair but I can feel no major hernia there I suspect he has a sheet of mesh in that region. No incarcerated hernias.  No hepatomegaly.  No splenomegaly Gen: Small bilateral inguinal hernias.  Right side somewhat sensitive.  Scrotal ultrasound shows question of epididymal cyst on the right side but no testicular torsion/ischemia/orchiditis or other major concern.  Mild inguinal lymphadenopathy.   Ext: No obvious deformity or contracture no significant edema.  No cyanosis Skin: No major subcutaneous nodules.  Warm and dry Musculoskeletal: Severe joint rigidity not present.  No obvious clubbing.  No digital petechiae.     Results:   Labs: Results for orders placed or performed during the hospital encounter of 09/01/20 (from the past 48 hour(s))  Comprehensive metabolic panel     Status: Abnormal   Collection Time: 09/01/20  4:24 PM  Result Value Ref Range   Sodium 136 135 - 145 mmol/L   Potassium 3.6 3.5 - 5.1 mmol/L   Chloride  107 98 - 111 mmol/L   CO2 24 22 - 32 mmol/L   Glucose, Bld 96 70 - 99 mg/dL    Comment: Glucose reference range applies only to samples taken after fasting for at least 8 hours.   BUN 15 8 - 23 mg/dL   Creatinine, Ser 1.10 0.61 - 1.24 mg/dL   Calcium 8.5 (L) 8.9 - 10.3 mg/dL   Total Protein 6.9 6.5 - 8.1 g/dL   Albumin 3.9 3.5 - 5.0 g/dL   AST 17 15 - 41 U/L   ALT 16 0 - 44 U/L   Alkaline Phosphatase 89 38 - 126 U/L   Total Bilirubin 1.0 0.3 - 1.2 mg/dL   GFR, Estimated >60 >60 mL/min    Comment: (NOTE) Calculated using the CKD-EPI Creatinine Equation (2021)    Anion gap 5 5 - 15    Comment: Performed at [facility], [place] [address], [place], [place] [address]  CBC with Differential     Status: Abnormal   Collection Time: 09/01/20  4:24 PM  Result Value Ref Range   WBC 3.4 (L) 4.0 - 10.5 K/uL   RBC 4.38 4.22 - 5.81 MIL/uL   Hemoglobin 13.8 13.0 - 17.0 g/dL   HCT 41.1 39.0 - 52.0 %   MCV 93.8 80.0 - 100.0 fL   MCH 31.5 26.0 - 34.0 pg   MCHC 33.6 30.0 - 36.0 g/dL   RDW 12.1 11.5 - 15.5 %   Platelets 167 150 - 400 K/uL   nRBC 0.0 0.0 - 0.2 %   Neutrophils Relative % 59 %   Neutro Abs 2.0 1.7 - 7.7 K/uL   Lymphocytes Relative 30 %   Lymphs Abs 1.0 0.7 - 4.0 K/uL   Monocytes Relative 9 %   Monocytes Absolute 0.3 0.1 - 1.0 K/uL   Eosinophils Relative 1 %   Eosinophils Absolute 0.0 0.0 - 0.5 K/uL   Basophils Relative 1 %   Basophils Absolute 0.0 0.0 - 0.1 K/uL   Immature Granulocytes 0 %   Abs Immature Granulocytes 0.00 0.00 - 0.07 K/uL    Comment: Performed at [facility], [place] [address], [place], [place] [address]  Urinalysis, Routine w reflex microscopic     Status: None   Collection Time: 09/01/20  4:24 PM  Result Value  Ref Range   Color, Urine YELLOW YELLOW   APPearance CLEAR CLEAR   Specific Gravity, Urine 1.011 1.005 - 1.030   pH 5.0 5.0 - 8.0   Glucose, UA NEGATIVE NEGATIVE mg/dL   Hgb urine dipstick NEGATIVE NEGATIVE    Bilirubin Urine NEGATIVE NEGATIVE   Ketones, ur NEGATIVE NEGATIVE mg/dL   Protein, ur NEGATIVE NEGATIVE mg/dL   Nitrite NEGATIVE NEGATIVE   Leukocytes,Ua NEGATIVE NEGATIVE    Comment: Performed at [place] [address], [place], [place] [address]  Lipase, blood     Status: None   Collection Time: 09/01/20  4:24 PM  Result Value Ref Range   Lipase 50 11 - 51 U/L    Comment: Performed at [facility], [place] [address] [place], [place] [address]    Imaging / Studies: CT ABDOMEN PELVIS W CONTRAST  Result Date: 09/01/2020 CLINICAL DATA:  Acute, diffuse abdominal pain and scrotal swelling. Lower pelvic swelling. Previous cholecystectomy and hernia repair. EXAM: CT ABDOMEN AND PELVIS WITH CONTRAST TECHNIQUE: Multidetector CT imaging of the abdomen and pelvis was performed using the standard protocol following bolus administration of intravenous contrast. CONTRAST:  142mL OMNIPAQUE IOHEXOL 300 MG/ML  SOLN COMPARISON:  08/30/2018 FINDINGS: Lower chest: Enlarged heart, including biatrial and left ventricular enlargement. Clear lung bases. Hepatobiliary: Mild diffuse low density of the liver relative to the spleen. Cholecystectomy clips. Pancreas: Mild to moderate diffuse atrophy. Spleen: Normal in size without focal abnormality. Adrenals/Urinary Tract: Normal appearing adrenal glands. Small lower pole left renal cyst. Unremarkable ureters and urinary bladder. Stomach/Bowel: Multiple colonic diverticula containing high-density material. Interval soft tissue stranding involving the proximal sigmoid colon in an area of diverticula, with a tiny extraluminal collection of air with no extraluminal fluid collections. Normal appearing appendix containing high density material or a partially calcified appendicolith distally. Small hiatal hernia. Unremarkable small bowel. Vascular/Lymphatic: Atheromatous arterial calcifications without aneurysm. Interval inclusion of a mildly  enlarged left inguinal lymph node with minimal adjacent soft tissue stranding. This is only partially included previously and currently has a short axis diameter of 13 mm on image number 95 series 2. This is unchanged since 03/23/2014. A mildly enlarged right inguinal lymph node without adjacent soft tissue stranding has a short axis diameter of 9 mm on image number 96 series 2, previously 10 mm and unchanged since 03/23/2014. Enlarged right common femoral node has not changed significantly, with a short axis diameter of 20 mm on image number 75 series 2. This is also stable since 03/23/2014. Reproductive: Moderately enlarged prostate gland. Other: Small bilateral inguinal hernias containing fat. Bilateral vas deferens clips. Bilateral hydroceles. Musculoskeletal: No acute bony abnormality. IMPRESSION: 1. Acute sigmoid colon diverticulitis with a tiny extraluminal collection of air without abscess. 2. Extensive colonic diverticulosis. 3. Small hiatal hernia. 4. Mild diffuse hepatic steatosis. 5. Moderately enlarged prostate gland. 6. Small bilateral inguinal hernias containing fat. 7. Bilateral hydroceles. 8. Stable mild bilateral inguinal and right common femoral adenopathy. The long-term stability is compatible with a benign process. 9. Cardiomegaly. Electronically Signed   By: Claudie Revering M.D.   On: 09/01/2020 19:13   US SCROTUM DOPPLER  Result Date: 09/01/2020 CLINICAL DATA:  Right scrotal pain and swelling for several months. EXAM: SCROTAL ULTRASOUND DOPPLER ULTRASOUND OF THE TESTICLES TECHNIQUE: Complete ultrasound examination of the testicles, epididymis, and other scrotal structures was performed. Color and spectral Doppler ultrasound were also utilized to evaluate blood flow to the testicles. COMPARISON:  None. FINDINGS: Right testicle Measurements: 4.3 x  3.3 x 3.2 cm. No mass or microlithiasis visualized. Left testicle Measurements: 4.4 x 2.9 x 3.4 cm. No mass or microlithiasis visualized. 7 mm  calculus along the left posterior testicular wall. Right epididymis: Normal in size and appearance. 1.2 cm right epididymal cyst noted. Left epididymis:  Normal in size and appearance. Hydrocele:  Small right hydrocele. Varicocele:  None visualized. Pulsed Doppler interrogation of both testes demonstrates normal low resistance arterial and venous waveforms bilaterally. IMPRESSION: 1. Normal appearance of the testicles. 2. 1.2 cm right epididymal cyst. 3. Small right hydrocele. Electronically Signed   By: Fidela Salisbury M.D.   On: 09/01/2020 18:46    Medications / Allergies: per chart  Antibiotics: Anti-infectives (From admission, onward)   Start     Dose/Rate Route Frequency Ordered Stop   09/01/20 2115  piperacillin-tazobactam (ZOSYN) IVPB 3.375 g        3.375 g 100 mL/hr over 30 Minutes Intravenous  Once 09/01/20 2110          Note: Portions of this report may have been transcribed using voice recognition software. Every effort was made to ensure accuracy; however, inadvertent computerized transcription errors may be present.   Any transcriptional errors that result from this process are unintentional.    Adin Hector, MD, FACS, MASCRS  Esophageal, Gastrointestinal & Colorectal Surgery Robotic and Minimally Invasive Surgery [facility] [address] [place], [place] [address] [phone] Fax [phone] Main/Paging  CONTACT INFORMATION: Weekday (9AM-5PM) concerns: Call [facility] main office at [phone] Weeknight (5PM-9AM) or Weekend/Holiday concerns: Check [email or web address] for General Surgery [facility] coverage (Please, do not use SecureChat as it is not reliable communication to operating surgeons for immediate patient care)      09/01/2020  9:17 PM

## 2020-09-01 NOTE — ED Provider Notes (Signed)
Care of patient assumed from Manuel Garcia at 1800.  Agree with history, physical exam and plan.  See their note for further details. Briefly, patient presents with 4 days of intermittent scrotal pain and swelling.  Patient also reports lower abdominal pain over this time period as well.   Physical Exam  BP (!) 159/94 (BP Location: Right Arm)   Pulse (!) 52   Temp 97.8 F (36.6 C)   Resp 15   SpO2 100%   Physical Exam Vitals and nursing note reviewed.  Constitutional:      General: He is not in acute distress.    Appearance: He is not ill-appearing, toxic-appearing or diaphoretic.  HENT:     Head: Normocephalic.  Eyes:     General: No scleral icterus.       Right eye: No discharge.        Left eye: No discharge.  Cardiovascular:     Rate and Rhythm: Bradycardia present.     Comments: Bradycardic at rate of 52 Pulmonary:     Effort: Pulmonary effort is normal. No respiratory distress.     Breath sounds: No stridor.  Abdominal:     General: Bowel sounds are normal. There is no distension. There are no signs of injury.     Palpations: There is no mass or pulsatile mass.     Tenderness: There is abdominal tenderness in the right lower quadrant and left lower quadrant. There is no guarding or rebound.     Hernia: There is no hernia in the umbilical area or ventral area.  Musculoskeletal:     Cervical back: Normal range of motion and neck supple.  Skin:    General: Skin is warm and dry.  Neurological:     General: No focal deficit present.     Mental Status: He is alert.  Psychiatric:        Behavior: Behavior is cooperative.     ED Course/Procedures     Procedures  MDM   GU exam performed by previous provider.    Lab work pending. CT abdomen pelvis and scrotal ultrasound pending.    Urine analysis shows no signs of infection or dehydration. CMP is unremarkable. Lipase within normal limits. CBC shows leukopenia at 3.4  Ultrasound shows normal appearance of  testicles, 1.2 cm right epididymal cyst, small right hydrocele.  Pelvis shows acute sigmoid colon diverticulitis with a tiny extraluminal collection of air without abscess, extensive colonic diverticulosis, small hiatal hernia, moderately enlarged prostate gland, small bilateral inguinal hernias containing fat, bilateral hydroceles, mild bilateral inguinal and right common femoral adenopathy.  Patient started on zosyn.    Due to patient's extraluminal collection of air will consult general surgery.  2102 spoke with Dr. Johney Maine who recommended admission for IV antibiotics.  Advised he will follow on consult.  2130 spoke to hospitalist Dr. Cyd Silence who will see the patient for admission.  Patient is agreeable with this plan.   Dyann Ruddle 09/01/20 2245    Lacretia Leigh, MD 09/06/20 1113

## 2020-09-01 NOTE — ED Notes (Signed)
ED TO INPATIENT HANDOFF REPORT  ED Nurse Name and Phone #:   S Name/Age/Gender Chad Guerra 70 y.o. male Room/Bed: WA01/WA01  Code Status   Code Status: Full Code  Home/SNF/Other Home Patient oriented to: self, place, time and situation Is this baseline? Yes   Triage Complete: Triage complete  Chief Complaint Acute diverticulitis [K57.92]  Triage Note Patient reports pain and swelling to scrotum x weeks with lower abdominal pain x a few days.     Allergies Allergies  Allergen Reactions  . Contrast Media [Iodinated Diagnostic Agents] Nausea Only  . Percocet [Oxycodone-Acetaminophen] Other (See Comments)    Shakes, chills, can tolerate tylenol    Level of Care/Admitting Diagnosis ED Disposition    ED Disposition Condition [facility] Area: [place] [[id number]]  Level of Care: Med-Surg [16]  Covid Evaluation: Asymptomatic Screening Protocol (No Symptoms)  Diagnosis: Acute diverticulitis [[id number]]  Admitting Physician: Vernelle Emerald [[id number]]  Attending Physician: Vernelle Emerald [[id number]]       B Medical/Surgery History Past Medical History:  Diagnosis Date  . Hypertension   . Medical history non-contributory    Past Surgical History:  Procedure Laterality Date  . ANTERIOR CERVICAL DECOMP/DISCECTOMY FUSION N/A 08/25/2012   Procedure: ANTERIOR CERVICAL DECOMPRESSION/DISCECTOMY FUSION 1 LEVEL (ACDF C5-6);  Surgeon: Melina Schools, MD;  Location: [facility];  Service: Orthopedics;  Laterality: N/A;  . ANTERIOR FUSION CERVICAL SPINE  08/25/2012   Dr Rolena Infante  . CHOLECYSTECTOMY N/A 07/04/2012   Procedure: LAPAROSCOPIC CHOLECYSTECTOMY WITH INTRAOPERATIVE CHOLANGIOGRAM;  Surgeon: Pedro Earls, MD;  Location: [facility];  Service: General;  Laterality: N/A;  . HERNIA REPAIR    . KNEE ARTHROSCOPY W/ ACL RECONSTRUCTION     LEFT  . ROTATOR CUFF REPAIR       A IV Location/Drains/Wounds Patient Lines/Drains/Airways Status     Active Line/Drains/Airways    Name Placement date Placement time Site Days   Peripheral IV 09/01/20 Left Antecubital 09/01/20  1735  Antecubital  less than 1   Incision 07/04/12 Abdomen Other (Comment) 07/04/12  0747  -- 2981   Incision 08/25/12 Neck Anterior 08/25/12  1729  -- 2929   Incision - 4 Ports Abdomen 1: Right;Lateral 2: Right;Superior 3: Medial;Superior 4: Umbilicus 31/54/00  8676  -- 2981          Intake/Output Last 24 hours No intake or output data in the 24 hours ending 09/01/20 2225  Labs/Imaging Results for orders placed or performed during the hospital encounter of 09/01/20 (from the past 48 hour(s))  Comprehensive metabolic panel     Status: Abnormal   Collection Time: 09/01/20  4:24 PM  Result Value Ref Range   Sodium 136 135 - 145 mmol/L   Potassium 3.6 3.5 - 5.1 mmol/L   Chloride 107 98 - 111 mmol/L   CO2 24 22 - 32 mmol/L   Glucose, Bld 96 70 - 99 mg/dL    Comment: Glucose reference range applies only to samples taken after fasting for at least 8 hours.   BUN 15 8 - 23 mg/dL   Creatinine, Ser 1.10 0.61 - 1.24 mg/dL   Calcium 8.5 (L) 8.9 - 10.3 mg/dL   Total Protein 6.9 6.5 - 8.1 g/dL   Albumin 3.9 3.5 - 5.0 g/dL   AST 17 15 - 41 U/L   ALT 16 0 - 44 U/L   Alkaline Phosphatase 89 38 - 126 U/L   Total Bilirubin 1.0 0.3 - 1.2 mg/dL  GFR, Estimated >60 >60 mL/min    Comment: (NOTE) Calculated using the CKD-EPI Creatinine Equation (2021)    Anion gap 5 5 - 15    Comment: Performed at [facility], [place] [address] [place], [place] [address]  CBC with Differential     Status: Abnormal   Collection Time: 09/01/20  4:24 PM  Result Value Ref Range   WBC 3.4 (L) 4.0 - 10.5 K/uL   RBC 4.38 4.22 - 5.81 MIL/uL   Hemoglobin 13.8 13.0 - 17.0 g/dL   HCT 41.1 39.0 - 52.0 %   MCV 93.8 80.0 - 100.0 fL   MCH 31.5 26.0 - 34.0 pg   MCHC 33.6 30.0 - 36.0 g/dL   RDW 12.1 11.5 - 15.5 %   Platelets 167 150 - 400 K/uL   nRBC 0.0 0.0 - 0.2 %    Neutrophils Relative % 59 %   Neutro Abs 2.0 1.7 - 7.7 K/uL   Lymphocytes Relative 30 %   Lymphs Abs 1.0 0.7 - 4.0 K/uL   Monocytes Relative 9 %   Monocytes Absolute 0.3 0.1 - 1.0 K/uL   Eosinophils Relative 1 %   Eosinophils Absolute 0.0 0.0 - 0.5 K/uL   Basophils Relative 1 %   Basophils Absolute 0.0 0.0 - 0.1 K/uL   Immature Granulocytes 0 %   Abs Immature Granulocytes 0.00 0.00 - 0.07 K/uL    Comment: Performed at [facility], [place] [address], [place], [place] [address]  Urinalysis, Routine w reflex microscopic     Status: None   Collection Time: 09/01/20  4:24 PM  Result Value Ref Range   Color, Urine YELLOW YELLOW   APPearance CLEAR CLEAR   Specific Gravity, Urine 1.011 1.005 - 1.030   pH 5.0 5.0 - 8.0   Glucose, UA NEGATIVE NEGATIVE mg/dL   Hgb urine dipstick NEGATIVE NEGATIVE   Bilirubin Urine NEGATIVE NEGATIVE   Ketones, ur NEGATIVE NEGATIVE mg/dL   Protein, ur NEGATIVE NEGATIVE mg/dL   Nitrite NEGATIVE NEGATIVE   Leukocytes,Ua NEGATIVE NEGATIVE    Comment: Performed at [facility], [place] [address] [place], [place] [address]  Lipase, blood     Status: None   Collection Time: 09/01/20  4:24 PM  Result Value Ref Range   Lipase 50 11 - 51 U/L    Comment: Performed at [facility], [place] [address], [place], [place] [address]   CT ABDOMEN PELVIS W CONTRAST  Result Date: 09/01/2020 CLINICAL DATA:  Acute, diffuse abdominal pain and scrotal swelling. Lower pelvic swelling. Previous cholecystectomy and hernia repair. EXAM: CT ABDOMEN AND PELVIS WITH CONTRAST TECHNIQUE: Multidetector CT imaging of the abdomen and pelvis was performed using the standard protocol following bolus administration of intravenous contrast. CONTRAST:  1109mL OMNIPAQUE IOHEXOL 300 MG/ML  SOLN COMPARISON:  08/30/2018 FINDINGS: Lower chest: Enlarged heart, including biatrial and left ventricular enlargement. Clear lung bases. Hepatobiliary: Mild diffuse  low density of the liver relative to the spleen. Cholecystectomy clips. Pancreas: Mild to moderate diffuse atrophy. Spleen: Normal in size without focal abnormality. Adrenals/Urinary Tract: Normal appearing adrenal glands. Small lower pole left renal cyst. Unremarkable ureters and urinary bladder. Stomach/Bowel: Multiple colonic diverticula containing high-density material. Interval soft tissue stranding involving the proximal sigmoid colon in an area of diverticula, with a tiny extraluminal collection of air with no extraluminal fluid collections. Normal appearing appendix containing high density material or a partially calcified appendicolith distally. Small hiatal hernia. Unremarkable small bowel. Vascular/Lymphatic: Atheromatous arterial  calcifications without aneurysm. Interval inclusion of a mildly enlarged left inguinal lymph node with minimal adjacent soft tissue stranding. This is only partially included previously and currently has a short axis diameter of 13 mm on image number 95 series 2. This is unchanged since 03/23/2014. A mildly enlarged right inguinal lymph node without adjacent soft tissue stranding has a short axis diameter of 9 mm on image number 96 series 2, previously 10 mm and unchanged since 03/23/2014. Enlarged right common femoral node has not changed significantly, with a short axis diameter of 20 mm on image number 75 series 2. This is also stable since 03/23/2014. Reproductive: Moderately enlarged prostate gland. Other: Small bilateral inguinal hernias containing fat. Bilateral vas deferens clips. Bilateral hydroceles. Musculoskeletal: No acute bony abnormality. IMPRESSION: 1. Acute sigmoid colon diverticulitis with a tiny extraluminal collection of air without abscess. 2. Extensive colonic diverticulosis. 3. Small hiatal hernia. 4. Mild diffuse hepatic steatosis. 5. Moderately enlarged prostate gland. 6. Small bilateral inguinal hernias containing fat. 7. Bilateral hydroceles. 8. Stable  mild bilateral inguinal and right common femoral adenopathy. The long-term stability is compatible with a benign process. 9. Cardiomegaly. Electronically Signed   By: Claudie Revering M.D.   On: 09/01/2020 19:13   US SCROTUM DOPPLER  Result Date: 09/01/2020 CLINICAL DATA:  Right scrotal pain and swelling for several months. EXAM: SCROTAL ULTRASOUND DOPPLER ULTRASOUND OF THE TESTICLES TECHNIQUE: Complete ultrasound examination of the testicles, epididymis, and other scrotal structures was performed. Color and spectral Doppler ultrasound were also utilized to evaluate blood flow to the testicles. COMPARISON:  None. FINDINGS: Right testicle Measurements: 4.3 x 3.3 x 3.2 cm. No mass or microlithiasis visualized. Left testicle Measurements: 4.4 x 2.9 x 3.4 cm. No mass or microlithiasis visualized. 7 mm calculus along the left posterior testicular wall. Right epididymis: Normal in size and appearance. 1.2 cm right epididymal cyst noted. Left epididymis:  Normal in size and appearance. Hydrocele:  Small right hydrocele. Varicocele:  None visualized. Pulsed Doppler interrogation of both testes demonstrates normal low resistance arterial and venous waveforms bilaterally. IMPRESSION: 1. Normal appearance of the testicles. 2. 1.2 cm right epididymal cyst. 3. Small right hydrocele. Electronically Signed   By: Fidela Salisbury M.D.   On: 09/01/2020 18:46    Pending Labs Unresulted Labs (From admission, onward)          Start     Ordered   09/02/20 0500  Comprehensive metabolic panel  Tomorrow morning,   R        09/01/20 2206   09/02/20 0500  Magnesium  Tomorrow morning,   R        09/01/20 2206   09/02/20 0500  CBC WITH DIFFERENTIAL  Tomorrow morning,   R        09/01/20 2206   09/01/20 2207  HIV Antibody (routine testing w rflx)  (HIV Antibody (Routine testing w reflex) panel)  Once,   STAT        09/01/20 2206   09/01/20 2119  SARS CORONAVIRUS 2 (TAT 6-24 HRS) Nasopharyngeal Nasopharyngeal Swab  (Tier 3 -  Symptomatic/asymptomatic with Precautions)  Once,   STAT       Question Answer Comment  Is this test for diagnosis or screening Screening   Symptomatic for COVID-19 as defined by CDC No   Hospitalized for COVID-19 No   Admitted to ICU for COVID-19 No   Previously tested for COVID-19 No   Resident in a congregate (group) care setting No   Employed in healthcare  setting Unknown   Has patient completed COVID vaccination(s) (2 doses of Pfizer/Moderna 1 dose of [employer]) Unknown      09/01/20 2118          Vitals/Pain Today's Vitals   09/01/20 1719 09/01/20 1800 09/01/20 2037 09/01/20 2144  BP: (!) 159/94 (!) 173/93 (!) 156/91   Pulse: (!) 52 (!) 50 (!) 54   Resp: 15  18   Temp:      SpO2: 100% 97% 98%   PainSc: 7    8     Isolation Precautions No active isolations  Medications Medications  acetaminophen (TYLENOL) tablet 650 mg (has no administration in time range)    Or  acetaminophen (TYLENOL) suppository 650 mg (has no administration in time range)  ondansetron (ZOFRAN) tablet 4 mg (has no administration in time range)    Or  ondansetron (ZOFRAN) injection 4 mg (has no administration in time range)  lactated ringers infusion (has no administration in time range)  morphine 2 MG/ML injection 2 mg (has no administration in time range)    Or  morphine 4 MG/ML injection 4 mg (has no administration in time range)  fentaNYL (SUBLIMAZE) injection 25 mcg (25 mcg Intravenous Given 09/01/20 1737)  iohexol (OMNIPAQUE) 300 MG/ML solution 100 mL (100 mLs Intravenous Contrast Given 09/01/20 1830)  piperacillin-tazobactam (ZOSYN) IVPB 3.375 g (3.375 g Intravenous New Bag/Given 09/01/20 2141)    Mobility walks with person assist Low fall risk   Focused Assessments gi    R Recommendations: See Admitting Provider Note  Report given to:   Additional Notes:  Gi - surgery/ watch and wait with antibiotics

## 2020-09-02 DIAGNOSIS — K5720 Diverticulitis of large intestine with perforation and abscess without bleeding: Principal | ICD-10-CM

## 2020-09-02 DIAGNOSIS — N503 Cyst of epididymis: Secondary | ICD-10-CM | POA: Diagnosis present

## 2020-09-02 DIAGNOSIS — N400 Enlarged prostate without lower urinary tract symptoms: Secondary | ICD-10-CM

## 2020-09-02 DIAGNOSIS — Z981 Arthrodesis status: Secondary | ICD-10-CM | POA: Diagnosis not present

## 2020-09-02 DIAGNOSIS — D709 Neutropenia, unspecified: Secondary | ICD-10-CM

## 2020-09-02 DIAGNOSIS — Z79899 Other long term (current) drug therapy: Secondary | ICD-10-CM | POA: Diagnosis not present

## 2020-09-02 DIAGNOSIS — I160 Hypertensive urgency: Secondary | ICD-10-CM | POA: Diagnosis present

## 2020-09-02 DIAGNOSIS — K4020 Bilateral inguinal hernia, without obstruction or gangrene, not specified as recurrent: Secondary | ICD-10-CM

## 2020-09-02 DIAGNOSIS — N433 Hydrocele, unspecified: Secondary | ICD-10-CM

## 2020-09-02 DIAGNOSIS — E669 Obesity, unspecified: Secondary | ICD-10-CM

## 2020-09-02 DIAGNOSIS — I10 Essential (primary) hypertension: Secondary | ICD-10-CM | POA: Diagnosis present

## 2020-09-02 DIAGNOSIS — Z8719 Personal history of other diseases of the digestive system: Secondary | ICD-10-CM

## 2020-09-02 DIAGNOSIS — R300 Dysuria: Secondary | ICD-10-CM | POA: Diagnosis present

## 2020-09-02 DIAGNOSIS — I480 Paroxysmal atrial fibrillation: Secondary | ICD-10-CM

## 2020-09-02 DIAGNOSIS — Z885 Allergy status to narcotic agent status: Secondary | ICD-10-CM | POA: Diagnosis not present

## 2020-09-02 DIAGNOSIS — Z6831 Body mass index (BMI) 31.0-31.9, adult: Secondary | ICD-10-CM | POA: Diagnosis not present

## 2020-09-02 DIAGNOSIS — Z20822 Contact with and (suspected) exposure to covid-19: Secondary | ICD-10-CM | POA: Diagnosis present

## 2020-09-02 DIAGNOSIS — Z86010 Personal history of colonic polyps: Secondary | ICD-10-CM | POA: Diagnosis not present

## 2020-09-02 DIAGNOSIS — D3502 Benign neoplasm of left adrenal gland: Secondary | ICD-10-CM | POA: Diagnosis present

## 2020-09-02 DIAGNOSIS — R1084 Generalized abdominal pain: Secondary | ICD-10-CM

## 2020-09-02 DIAGNOSIS — Z7901 Long term (current) use of anticoagulants: Secondary | ICD-10-CM

## 2020-09-02 DIAGNOSIS — H401190 Primary open-angle glaucoma, unspecified eye, stage unspecified: Secondary | ICD-10-CM | POA: Diagnosis present

## 2020-09-02 DIAGNOSIS — K5792 Diverticulitis of intestine, part unspecified, without perforation or abscess without bleeding: Secondary | ICD-10-CM

## 2020-09-02 DIAGNOSIS — Z91041 Radiographic dye allergy status: Secondary | ICD-10-CM | POA: Diagnosis not present

## 2020-09-02 DIAGNOSIS — I482 Chronic atrial fibrillation, unspecified: Secondary | ICD-10-CM | POA: Diagnosis present

## 2020-09-02 DIAGNOSIS — Z9049 Acquired absence of other specified parts of digestive tract: Secondary | ICD-10-CM | POA: Diagnosis not present

## 2020-09-02 DIAGNOSIS — K449 Diaphragmatic hernia without obstruction or gangrene: Secondary | ICD-10-CM | POA: Diagnosis present

## 2020-09-02 DIAGNOSIS — D126 Benign neoplasm of colon, unspecified: Secondary | ICD-10-CM | POA: Diagnosis not present

## 2020-09-02 DIAGNOSIS — N50819 Testicular pain, unspecified: Secondary | ICD-10-CM | POA: Diagnosis present

## 2020-09-02 LAB — COMPREHENSIVE METABOLIC PANEL
ALT: 16 U/L (ref 0–44)
AST: 15 U/L (ref 15–41)
Albumin: 3.2 g/dL — ABNORMAL LOW (ref 3.5–5.0)
Alkaline Phosphatase: 79 U/L (ref 38–126)
Anion gap: 9 (ref 5–15)
BUN: 11 mg/dL (ref 8–23)
CO2: 25 mmol/L (ref 22–32)
Calcium: 8.4 mg/dL — ABNORMAL LOW (ref 8.9–10.3)
Chloride: 104 mmol/L (ref 98–111)
Creatinine, Ser: 1.02 mg/dL (ref 0.61–1.24)
GFR, Estimated: >60 mL/min (ref >60)
Glucose, Bld: 98 mg/dL (ref 70–99)
Potassium: 3.3 mmol/L — ABNORMAL LOW (ref 3.5–5.1)
Sodium: 138 mmol/L (ref 135–145)
Total Bilirubin: 1.6 mg/dL — ABNORMAL HIGH (ref 0.3–1.2)
Total Protein: 6.3 g/dL — ABNORMAL LOW (ref 6.5–8.1)

## 2020-09-02 LAB — CBC WITH DIFFERENTIAL/PLATELET
Abs Immature Granulocytes: 0.01 10*3/uL (ref 0.00–0.07)
Basophils Absolute: 0 10*3/uL (ref 0.0–0.1)
Basophils Relative: 0 %
Eosinophils Absolute: 0.1 10*3/uL (ref 0.0–0.5)
Eosinophils Relative: 3 %
HCT: 40.7 % (ref 39.0–52.0)
Hemoglobin: 13.5 g/dL (ref 13.0–17.0)
Immature Granulocytes: 0 %
Lymphocytes Relative: 41 %
Lymphs Abs: 1.1 10*3/uL (ref 0.7–4.0)
MCH: 30.7 pg (ref 26.0–34.0)
MCHC: 33.2 g/dL (ref 30.0–36.0)
MCV: 92.5 fL (ref 80.0–100.0)
Monocytes Absolute: 0.3 10*3/uL (ref 0.1–1.0)
Monocytes Relative: 11 %
Neutro Abs: 1.2 10*3/uL — ABNORMAL LOW (ref 1.7–7.7)
Neutrophils Relative %: 45 %
Platelets: 162 10*3/uL (ref 150–400)
RBC: 4.4 MIL/uL (ref 4.22–5.81)
RDW: 12.2 % (ref 11.5–15.5)
WBC: 2.7 10*3/uL — ABNORMAL LOW (ref 4.0–10.5)
nRBC: 0 % (ref 0.0–0.2)

## 2020-09-02 LAB — SARS CORONAVIRUS 2 (TAT 6-24 HRS): SARS Coronavirus 2: NEGATIVE

## 2020-09-02 LAB — HIV ANTIBODY (ROUTINE TESTING W REFLEX): HIV Screen 4th Generation wRfx: NONREACTIVE

## 2020-09-02 LAB — MAGNESIUM: Magnesium: 1.9 mg/dL (ref 1.7–2.4)

## 2020-09-02 MED ORDER — TRAZODONE HCL 50 MG PO TABS
50.0000 mg | ORAL_TABLET | Freq: Once | ORAL | Status: AC
  Administered 2020-09-02: 50 mg via ORAL
  Filled 2020-09-02: qty 1

## 2020-09-02 MED ORDER — ENOXAPARIN SODIUM 100 MG/ML ~~LOC~~ SOLN
100.0000 mg | Freq: Two times a day (BID) | SUBCUTANEOUS | Status: DC
  Administered 2020-09-02 – 2020-09-04 (×4): 100 mg via SUBCUTANEOUS
  Filled 2020-09-02 (×4): qty 1

## 2020-09-02 MED ORDER — POTASSIUM CHLORIDE 10 MEQ/100ML IV SOLN
10.0000 meq | INTRAVENOUS | Status: AC
  Administered 2020-09-02 (×4): 10 meq via INTRAVENOUS
  Filled 2020-09-02 (×4): qty 100

## 2020-09-02 NOTE — Plan of Care (Signed)
?  Problem: Education: ?Goal: Knowledge of General Education information will improve ?Description: Including pain rating scale, medication(s)/side effects and non-pharmacologic comfort measures ?Outcome: Progressing ?  ?Problem: Health Behavior/Discharge Planning: ?Goal: Ability to manage health-related needs will improve ?Outcome: Progressing ?  ?Problem: Coping: ?Goal: Level of anxiety will decrease ?Outcome: Progressing ?  ?

## 2020-09-02 NOTE — Progress Notes (Addendum)
PROGRESS NOTE  Chad Guerra EHU:[id number] DOB: 09-May-1951 DOA: 09/01/2020 PCP: [name], MD   LOS: 0 days   Brief narrative: 70 year old male with past medical history of paroxysmal atrial fibrillation (on Eliquis), hypertension, benign prostatic hyperplasia and f recurrent diverticulitis (2010, 2013, 2015, 2020) who presented to the [facility] hospital with complaints of abdominal and scrotal pain for 2 weeks.  In the ED, patient had a CT scan of the abdomen and pelvis which showed evidence of acute sigmoid colon diverticulitis with a small amount of extraluminal air identified concerning for microperforation.  Patient was then initiated on intravenous Zosyn, intravenous fluids.    General surgery was consulted and the patient was admitted to hospital for further evaluation and treatment.    Assessment/Plan:  Principal Problem:   Diverticulitis of large intestine with perforation without abscess or bleeding Active Problems:   Adenomatous polyp of colon   Adenoma of left adrenal gland   BPH (benign prostatic hyperplasia)   Neutropenia - chronic   Paroxysmal atrial fibrillation (HCC)   Primary open angle glaucoma   History of colonic diverticulitis   Chronic anticoagulation   Bilateral inguinal hernia (BIH)   Obesity (BMI 30-39.9)   Hypertensive urgency   Bilateral hydrocele  Diverticulitis of large intestine with perforation without abscess or bleeding History of recurrent diverticulitis.  Continue Zosyn.  IV fluids n.p.o. supportive care.  Surgery on board.  We will continue to follow surgical recommendation .  Patient might need surgical resection.  Clears at this time.    Hypertensive urgency On presentation likely exacerbated by pain.  Continue atenolol, hydralazine, lisinopril.  Continue to monitor blood pressure closely.  Will get EKG and echo in a.m.   History of Adenoma of left adrenal gland Mention of "mild nodular thickening of the bilateral adrenal  glands with left-sided adrenal adenoma" [facility] with mention of plan to proceed with pheochromocytoma work-up.  Repeat CT scan this time did not mention any imaging evidence of adrenal enlargement or adenoma.  Clinical picture is also not consistent with pheochromocytoma at this time    BPH (benign prostatic hyperplasia)  On Proscar and Flomax    Paroxysmal atrial fibrillation  On Eliquis, atenolol at home.  Currently rate controlled. Will hold Eliquis on hold for possible surgical intervention.  Initiate therapeutic Lovenox.    Primary open angle glaucoma Continue Alphagan.   Inguinal reducible hernia with Bilateral hydrocele Patient follows up with the [facility] on this.  Chronic  neutropenia.  Followed by hematology at the [facility].  Does not seem to have any obvious reason.  CBC Latest Ref Rng & Units 09/02/2020 09/01/2020 03/23/2014  WBC 4.0 - 10.5 K/uL 2.7(L) 3.4(L) -  Hemoglobin 13.0 - 17.0 g/dL 13.5 13.8 17.3(H)  Hematocrit 39.0 - 52.0 % 40.7 41.1 51.0  Platelets 150 - 400 K/uL 162 167 -    DVT prophylaxis:  apixaban (ELIQUIS) tablet 5 mg    Code Status: Full code  Family Communication: None  Status is: Observation  The patient will require care spanning > 2 midnights and should be moved to inpatient because: IV treatments appropriate due to intensity of illness or inability to take PO, Inpatient level of care appropriate due to severity of illness and Surgical consultation, IV antibiotic n.p.o. status  Dispo: The patient is from: Home              Anticipated d/c is to: Home  Patient currently is not medically stable to d/c.   Difficult to place patient No   Consultants:  General surgery  Procedures:  None  Anti-infectives:  Marland Kitchen Zosyn 4/10>  Anti-infectives (From admission, onward)   Start     Dose/Rate Route Frequency Ordered Stop   09/02/20 0600  piperacillin-tazobactam (ZOSYN) IVPB 3.375 g        3.375 g 12.5 mL/hr over 240 Minutes  Intravenous Every 8 hours 09/01/20 2250     09/01/20 2115  piperacillin-tazobactam (ZOSYN) IVPB 3.375 g        3.375 g 100 mL/hr over 30 Minutes Intravenous  Once 09/01/20 2110 09/01/20 2227     Subjective: Today, patient was seen and examined at bedside.  Today, patient states that she had some sleep yesterday but that his pain is little better.  No nausea vomiting.  Has had a bowel movement yesterday.  Has been passing some gas.  Objective: Vitals:   09/02/20 0409 09/02/20 0920  BP: 115/69 132/84  Pulse: (!) 52   Resp: 20   Temp: 97.8 F (36.6 C)   SpO2: 94%     Intake/Output Summary (Last 24 hours) at 09/02/2020 1020 Last data filed at 09/02/2020 0900 Gross per 24 hour  Intake 987.16 ml  Output 975 ml  Net 12.16 ml   There were no vitals filed for this visit. There is no height or weight on file to calculate BMI.   Physical Exam: GENERAL: Patient is alert awake and oriented. Not in obvious distress. HENT: No scleral pallor or icterus. Pupils equally reactive to light. Oral mucosa is moist NECK: is supple, no gross swelling noted. CHEST: Clear to auscultation. No crackles or wheezes.  Diminished breath sounds bilaterally. CVS: S1 and S2 heard, no murmur. Regular rate and rhythm.  ABDOMEN: Soft, distended palpation mostly on the left left lower quadrant.  EXTREMITIES: No edema. CNS: Cranial nerves are intact. No focal motor deficits. SKIN: warm and dry without rashes.  Data Review: I have personally reviewed the following laboratory data and studies,  CBC: Recent Labs  Lab 09/01/20 1624 09/02/20 0525  WBC 3.4* 2.7*  NEUTROABS 2.0 1.2*  HGB 13.8 13.5  HCT 41.1 40.7  MCV 93.8 92.5  PLT 167 378   Basic Metabolic Panel: Recent Labs  Lab 09/01/20 1624 09/02/20 0525  NA 136 138  K 3.6 3.3*  CL 107 104  CO2 24 25  GLUCOSE 96 98  BUN 15 11  CREATININE 1.10 1.02  CALCIUM 8.5* 8.4*  MG  --  1.9   Liver Function Tests: Recent Labs  Lab 09/01/20 1624  09/02/20 0525  AST 17 15  ALT 16 16  ALKPHOS 89 79  BILITOT 1.0 1.6*  PROT 6.9 6.3*  ALBUMIN 3.9 3.2*   Recent Labs  Lab 09/01/20 1624  LIPASE 50   No results for input(s): AMMONIA in the last 168 hours. Cardiac Enzymes: No results for input(s): CKTOTAL, CKMB, CKMBINDEX, TROPONINI in the last 168 hours. BNP (last 3 results) No results for input(s): BNP in the last 8760 hours.  ProBNP (last 3 results) No results for input(s): PROBNP in the last 8760 hours.  CBG: No results for input(s): GLUCAP in the last 168 hours. Recent Results (from the past 240 hour(s))  SARS CORONAVIRUS 2 (TAT 6-24 HRS) Nasopharyngeal Nasopharyngeal Swab     Status: None   Collection Time: 09/01/20  9:44 PM   Specimen: Nasopharyngeal Swab  Result Value Ref Range Status   SARS Coronavirus 2  NEGATIVE NEGATIVE Final    Comment: (NOTE) SARS-CoV-2 target nucleic acids are NOT DETECTED.  The SARS-CoV-2 RNA is generally detectable in upper and lower respiratory specimens during the acute phase of infection. Negative results do not preclude SARS-CoV-2 infection, do not rule out co-infections with other pathogens, and should not be used as the sole basis for treatment or other patient management decisions. Negative results must be combined with clinical observations, patient history, and epidemiological information. The expected result is Negative.  Fact Sheet for Patients: SugarRoll.be  Fact Sheet for Healthcare Providers: https://www.woods-mathews.com/  This test is not yet approved or cleared by the [place] FDA and  has been authorized for detection and/or diagnosis of SARS-CoV-2 by FDA under an Emergency Use Authorization (EUA). This EUA will remain  in effect (meaning this test can be used) for the duration of the COVID-19 declaration under Se ction 564(b)(1) of the Act, 21 U.S.C. section 360bbb-3(b)(1), unless the authorization is terminated  or revoked sooner.  Performed at [facility], [place] [address], [place], [place] [address]      Studies: CT ABDOMEN PELVIS W CONTRAST  Result Date: 09/01/2020 CLINICAL DATA:  Acute, diffuse abdominal pain and scrotal swelling. Lower pelvic swelling. Previous cholecystectomy and hernia repair. EXAM: CT ABDOMEN AND PELVIS WITH CONTRAST TECHNIQUE: Multidetector CT imaging of the abdomen and pelvis was performed using the standard protocol following bolus administration of intravenous contrast. CONTRAST:  157mL OMNIPAQUE IOHEXOL 300 MG/ML  SOLN COMPARISON:  08/30/2018 FINDINGS: Lower chest: Enlarged heart, including biatrial and left ventricular enlargement. Clear lung bases. Hepatobiliary: Mild diffuse low density of the liver relative to the spleen. Cholecystectomy clips. Pancreas: Mild to moderate diffuse atrophy. Spleen: Normal in size without focal abnormality. Adrenals/Urinary Tract: Normal appearing adrenal glands. Small lower pole left renal cyst. Unremarkable ureters and urinary bladder. Stomach/Bowel: Multiple colonic diverticula containing high-density material. Interval soft tissue stranding involving the proximal sigmoid colon in an area of diverticula, with a tiny extraluminal collection of air with no extraluminal fluid collections. Normal appearing appendix containing high density material or a partially calcified appendicolith distally. Small hiatal hernia. Unremarkable small bowel. Vascular/Lymphatic: Atheromatous arterial calcifications without aneurysm. Interval inclusion of a mildly enlarged left inguinal lymph node with minimal adjacent soft tissue stranding. This is only partially included previously and currently has a short axis diameter of 13 mm on image number 95 series 2. This is unchanged since 03/23/2014. A mildly enlarged right inguinal lymph node without adjacent soft tissue stranding has a short axis diameter of 9 mm on image number 96 series 2, previously 10 mm and  unchanged since 03/23/2014. Enlarged right common femoral node has not changed significantly, with a short axis diameter of 20 mm on image number 75 series 2. This is also stable since 03/23/2014. Reproductive: Moderately enlarged prostate gland. Other: Small bilateral inguinal hernias containing fat. Bilateral vas deferens clips. Bilateral hydroceles. Musculoskeletal: No acute bony abnormality. IMPRESSION: 1. Acute sigmoid colon diverticulitis with a tiny extraluminal collection of air without abscess. 2. Extensive colonic diverticulosis. 3. Small hiatal hernia. 4. Mild diffuse hepatic steatosis. 5. Moderately enlarged prostate gland. 6. Small bilateral inguinal hernias containing fat. 7. Bilateral hydroceles. 8. Stable mild bilateral inguinal and right common femoral adenopathy. The long-term stability is compatible with a benign process. 9. Cardiomegaly. Electronically Signed   By: Claudie Revering M.D.   On: 09/01/2020 19:13   US SCROTUM DOPPLER  Result Date: 09/01/2020 CLINICAL DATA:  Right scrotal pain and swelling for several months. EXAM:  SCROTAL ULTRASOUND DOPPLER ULTRASOUND OF THE TESTICLES TECHNIQUE: Complete ultrasound examination of the testicles, epididymis, and other scrotal structures was performed. Color and spectral Doppler ultrasound were also utilized to evaluate blood flow to the testicles. COMPARISON:  None. FINDINGS: Right testicle Measurements: 4.3 x 3.3 x 3.2 cm. No mass or microlithiasis visualized. Left testicle Measurements: 4.4 x 2.9 x 3.4 cm. No mass or microlithiasis visualized. 7 mm calculus along the left posterior testicular wall. Right epididymis: Normal in size and appearance. 1.2 cm right epididymal cyst noted. Left epididymis:  Normal in size and appearance. Hydrocele:  Small right hydrocele. Varicocele:  None visualized. Pulsed Doppler interrogation of both testes demonstrates normal low resistance arterial and venous waveforms bilaterally. IMPRESSION: 1. Normal appearance of  the testicles. 2. 1.2 cm right epididymal cyst. 3. Small right hydrocele. Electronically Signed   By: Fidela Salisbury M.D.   On: 09/01/2020 18:46      Flora Lipps, MD  [facility] 09/02/2020  If 7PM-7AM, please contact night-coverage

## 2020-09-02 NOTE — Progress Notes (Signed)
ANTICOAGULATION CONSULT NOTE - Initial Consult  Pharmacy Consult for Enoxaparin Indication: atrial fibrillation  Allergies  Allergen Reactions  . Contrast Media [Iodinated Diagnostic Agents] Nausea Only  . Percocet [Oxycodone-Acetaminophen] Other (See Comments)    Shakes, chills, can tolerate tylenol    Patient Measurements: Height: 5' 10.5" (179.1 cm) Weight: 101.3 kg (223 lb 5.2 oz) IBW/kg (Calculated) : 74.15  Vital Signs: Temp: 97.8 F (36.6 C) (04/10 1020) Temp Source: Oral (04/10 1020) BP: 151/85 (04/10 1020) Pulse Rate: 51 (04/10 1020)  Labs: Recent Labs    09/01/20 1624 09/02/20 0525  HGB 13.8 13.5  HCT 41.1 40.7  PLT 167 162  CREATININE 1.10 1.02    Estimated Creatinine Clearance: 83.3 mL/min (by C-G formula based on SCr of 1.02 mg/dL).   Medical History: Past Medical History:  Diagnosis Date  . Bilateral hydrocele 09/01/2020  . Hypertension   . Medical history non-contributory   . Paroxysmal atrial fibrillation ([place]) 09/01/2020   Mar 26, 2018 Entered By: [name] L Comment: Holter 2019  . Primary open angle glaucoma 09/01/2020    Assessment: 21 y/oM with PMH of paroxysmal atrial fibrillation on Apixaban 5mg  PO BID admitted for recurrent sigmoid colon diverticulitis with perforation. Apixaban held today (last dose this morning at 0920), and pharmacy consulted for Enoxaparin dosing due to possible need for surgical intervention. SCr 1.02 with CrCl ~ 83 ml/min. H/H, Pltc WNL.   Goal of Therapy:  Anti-Xa level 0.6-1 units/ml 4hrs after LMWH dose given Monitor platelets by anticoagulation protocol: Yes   Plan:  At 2200 tonight, start Enoxaparin 1mg /kg SQ q12h Monitor renal function, CBC at least q72h, and for s/sx of bleeding F/u surgical plans and when Enoxaparin needs to be held per [facility]   Lindell Spar, PharmD, BCPS 09/02/2020,11:04 AM

## 2020-09-02 NOTE — Progress Notes (Addendum)
Chad Guerra [id number] 03/01/1951  CARE TEAM:  PCP: [name], MD  Outpatient Care Team: Patient Care Team: [name], MD as PCP - General (Internal Medicine) Melina Schools, MD as Consulting Physician (Orthopedic Surgery) Johnathan Hausen, MD as Consulting Physician (General Surgery) Neill Loft, Halina Maidens, MD as Referring Physician (Ophthalmology) Dillard Essex Zollie Beckers, MD (Hematology and Oncology)  Inpatient Treatment Team: Treatment Team: Attending Provider: Flora Lipps, MD; Consulting Physician: Edison Pace Md, MD; Registered Nurse: Chucky May, RN; Technician: Philippa Chester, NT; Rounding Team: Garner Gavel, MD; Utilization Review: Alease Medina, RN; Registered Nurse: Thornton Papas, RN; Janeece Riggers: Hassell Done, Theone Stanley; Registered Nurse: Simonne Maffucci, RN; Registered Nurse: Jerene Pitch, RN   Problem List:   Principal Problem:   Diverticulitis of large intestine with perforation without abscess or bleeding Active Problems:   Neutropenia - chronic   History of colonic diverticulitis   Chronic anticoagulation   Adenomatous polyp of colon   Adenoma of left adrenal gland   BPH (benign prostatic hyperplasia)   Paroxysmal atrial fibrillation ([place])   Primary open angle glaucoma   Bilateral inguinal hernia (BIH)   Obesity (BMI 30-39.9)   Hypertensive urgency   Bilateral hydrocele      * No surgery found *      Assessment  Recurrent sigmoid colon diverticulitis with perforation.  [facility] Stay = 0 days)  Plan:  -Continue IV antibiotics.  Zosyn given complicated case.  Holding Eliquis anticoagulation.  Last PO dose 4/9 AM  Chronic neutropenia for many years followed by hematology through the [facility].  Does not seem to have any major consequence or any cancer lymphatic / lymphocytic etiology  Creatinine seems to be improving.  Probably has some baseline chronic kidney disease.  Inguinal hernias reducible.   No need for surgery at this time  Follow electrolytes.  Awaiting results from Covid testing.  Airborne precautions for now  Clears only for now.  If pain wanes and he has more consistent bowel function can consider advancing diet.  Patient worsens or does not improve by 4-5 days, repeat CAT scan to rule out abscess.  If rapid decline, emergency Hartman resection.  Hopefully not too likely but we will see.  Patient would benefit from segmental colonic resection to break the cycle of attacks since he has had numerous attacks and this 1 is complicated.  He is overdue for colonoscopy.  Ideally would wait 6 weeks from this attack, colonoscopy the day before surgery, robotic colectomy.  Would like medical and cardiac clearance.  May need feedback from the [place].   -VTE prophylaxis- SCDs, etc -mobilize as tolerated to help recovery  Disposition:  Disposition:  The patient is from: Home  Anticipate discharge to:  Home  Anticipated Date of Discharge is:  April 13,2022   Barriers to discharge:  Pending Clinical improvement (more likely than not)  Patient currently is NOT MEDICALLY STABLE for discharge from the hospital from a surgery standpoint.      20 minutes spent in review, evaluation, examination, counseling, and coordination of care.   I have reviewed this patient's available data, including medical history, events of note, physical examination and test results as part of my evaluation.  A significant portion of that time was spent in counseling.  Care during the described time interval was provided by me.  09/02/2020    Subjective: (Chief complaint)  Patient tired.  Still sore but certainly not worse.  Had bowel movement with no flatus.  No nausea this morning.  Urinating okay  Objective:  Vital signs:  Vitals:   09/01/20 2315 09/01/20 2330 09/02/20 0004 09/02/20 0409  BP: (!) 189/118 (!) 185/107 (!) 148/86 115/69  Pulse:   (!) 57 (!) 52  Resp: 17 17 20 20    Temp:  98.2 F (36.8 C) 98.2 F (36.8 C) 97.8 F (36.6 C)  TempSrc:  Oral Oral Oral  SpO2:  99% 99% 94%    Last BM Date: 08/31/20  Intake/Output   Yesterday:  04/09 0701 - 04/10 0700 In: 987.2 [P.O.:480; I.V.:507.2] Out: 500 [Urine:500] This shift:  No intake/output data recorded.  Bowel function:  Flatus: No  BM:  YES  Drain: (No drain)   Physical Exam:  General: Pt awake/alert in no acute distress Eyes: PERRL, normal EOM.  Sclera clear.  No icterus Neuro: CN II-XII intact w/o focal sensory/motor deficits. Lymph: No head/neck/groin lymphadenopathy Psych:  No delerium/psychosis/paranoia.  Oriented x 4 HENT: Normocephalic, Mucus membranes moist.  No thrush Neck: Supple, No tracheal deviation.  No obvious thyromegaly Chest: No pain to chest wall compression.  Good respiratory excursion.  No audible wheezing CV:  Pulses intact.  Regular rhythm.  No major extremity edema MS: Normal AROM mjr joints.  No obvious deformity  Abdomen: Soft.  Moderately distended.  Tenderness at left side c/w mild peritonitis.  Stable - not worse.  No evidence of peritonitis.  No incarcerated hernias.  Gen: Small sensitive inguinal hernias.  No incarceration Ext:   No deformity.  No mjr edema.  No cyanosis Skin: No petechiae / purpurea.  No major sores.  Warm and dry    Results:   Cultures: No results found for this or any previous visit (from the past 720 hour(s)).  Labs: Results for orders placed or performed during the hospital encounter of 09/01/20 (from the past 48 hour(s))  Comprehensive metabolic panel     Status: Abnormal   Collection Time: 09/01/20  4:24 PM  Result Value Ref Range   Sodium 136 135 - 145 mmol/L   Potassium 3.6 3.5 - 5.1 mmol/L   Chloride 107 98 - 111 mmol/L   CO2 24 22 - 32 mmol/L   Glucose, Bld 96 70 - 99 mg/dL    Comment: Glucose reference range applies only to samples taken after fasting for at least 8 hours.   BUN 15 8 - 23 mg/dL   Creatinine, Ser  1.10 0.61 - 1.24 mg/dL   Calcium 8.5 (L) 8.9 - 10.3 mg/dL   Total Protein 6.9 6.5 - 8.1 g/dL   Albumin 3.9 3.5 - 5.0 g/dL   AST 17 15 - 41 U/L   ALT 16 0 - 44 U/L   Alkaline Phosphatase 89 38 - 126 U/L   Total Bilirubin 1.0 0.3 - 1.2 mg/dL   GFR, Estimated >60 >60 mL/min    Comment: (NOTE) Calculated using the CKD-EPI Creatinine Equation (2021)    Anion gap 5 5 - 15    Comment: Performed at [facility], [place] [address], [place], [place] [address]  CBC with Differential     Status: Abnormal   Collection Time: 09/01/20  4:24 PM  Result Value Ref Range   WBC 3.4 (L) 4.0 - 10.5 K/uL   RBC 4.38 4.22 - 5.81 MIL/uL   Hemoglobin 13.8 13.0 - 17.0 g/dL   HCT 41.1 39.0 - 52.0 %   MCV 93.8 80.0 - 100.0 fL   MCH 31.5 26.0 - 34.0 pg   MCHC 33.6 30.0 - 36.0 g/dL  RDW 12.1 11.5 - 15.5 %   Platelets 167 150 - 400 K/uL   nRBC 0.0 0.0 - 0.2 %   Neutrophils Relative % 59 %   Neutro Abs 2.0 1.7 - 7.7 K/uL   Lymphocytes Relative 30 %   Lymphs Abs 1.0 0.7 - 4.0 K/uL   Monocytes Relative 9 %   Monocytes Absolute 0.3 0.1 - 1.0 K/uL   Eosinophils Relative 1 %   Eosinophils Absolute 0.0 0.0 - 0.5 K/uL   Basophils Relative 1 %   Basophils Absolute 0.0 0.0 - 0.1 K/uL   Immature Granulocytes 0 %   Abs Immature Granulocytes 0.00 0.00 - 0.07 K/uL    Comment: Performed at [facility], [place] [address] [place], [place] [address]  Urinalysis, Routine w reflex microscopic     Status: None   Collection Time: 09/01/20  4:24 PM  Result Value Ref Range   Color, Urine YELLOW YELLOW   APPearance CLEAR CLEAR   Specific Gravity, Urine 1.011 1.005 - 1.030   pH 5.0 5.0 - 8.0   Glucose, UA NEGATIVE NEGATIVE mg/dL   Hgb urine dipstick NEGATIVE NEGATIVE   Bilirubin Urine NEGATIVE NEGATIVE   Ketones, ur NEGATIVE NEGATIVE mg/dL   Protein, ur NEGATIVE NEGATIVE mg/dL   Nitrite NEGATIVE NEGATIVE   Leukocytes,Ua NEGATIVE NEGATIVE    Comment: Performed at [facility], [place] [address], [place], [place] [address]  Lipase, blood     Status: None   Collection Time: 09/01/20  4:24 PM  Result Value Ref Range   Lipase 50 11 - 51 U/L    Comment: Performed at [facility] [place] [facility], [place] [address], [place], [place] [address]  Comprehensive metabolic panel     Status: Abnormal   Collection Time: 09/02/20  5:25 AM  Result Value Ref Range   Sodium 138 135 - 145 mmol/L   Potassium 3.3 (L) 3.5 - 5.1 mmol/L   Chloride 104 98 - 111 mmol/L   CO2 25 22 - 32 mmol/L   Glucose, Bld 98 70 - 99 mg/dL    Comment: Glucose reference range applies only to samples taken after fasting for at least 8 hours.   BUN 11 8 - 23 mg/dL   Creatinine, Ser 1.02 0.61 - 1.24 mg/dL   Calcium 8.4 (L) 8.9 - 10.3 mg/dL   Total Protein 6.3 (L) 6.5 - 8.1 g/dL   Albumin 3.2 (L) 3.5 - 5.0 g/dL   AST 15 15 - 41 U/L   ALT 16 0 - 44 U/L   Alkaline Phosphatase 79 38 - 126 U/L   Total Bilirubin 1.6 (H) 0.3 - 1.2 mg/dL   GFR, Estimated >60 >60 mL/min    Comment: (NOTE) Calculated using the CKD-EPI Creatinine Equation (2021)    Anion gap 9 5 - 15    Comment: Performed at [facility], [place] [address], [place], [place] [address]  Magnesium     Status: None   Collection Time: 09/02/20  5:25 AM  Result Value Ref Range   Magnesium 1.9 1.7 - 2.4 mg/dL    Comment: Performed at [facility], [place] [address], [place], [place] [address]  CBC WITH DIFFERENTIAL     Status: Abnormal   Collection Time: 09/02/20  5:25 AM  Result Value Ref Range   WBC 2.7 (L) 4.0 - 10.5 K/uL   RBC 4.40 4.22 - 5.81 MIL/uL   Hemoglobin 13.5 13.0 - 17.0 g/dL   HCT 40.7 39.0 -  52.0 %   MCV 92.5 80.0 - 100.0 fL   MCH 30.7 26.0 - 34.0 pg   MCHC 33.2 30.0 - 36.0 g/dL   RDW 12.2 11.5 - 15.5 %   Platelets 162 150 - 400 K/uL   nRBC 0.0 0.0 - 0.2 %   Neutrophils Relative % 45 %   Neutro Abs 1.2 (L) 1.7 - 7.7 K/uL   Lymphocytes Relative 41 %   Lymphs Abs 1.1 0.7 - 4.0 K/uL    Monocytes Relative 11 %   Monocytes Absolute 0.3 0.1 - 1.0 K/uL   Eosinophils Relative 3 %   Eosinophils Absolute 0.1 0.0 - 0.5 K/uL   Basophils Relative 0 %   Basophils Absolute 0.0 0.0 - 0.1 K/uL   Immature Granulocytes 0 %   Abs Immature Granulocytes 0.01 0.00 - 0.07 K/uL    Comment: Performed at [facility], [place] [address], [place], [place] [address]    Imaging / Studies: CT ABDOMEN PELVIS W CONTRAST  Result Date: 09/01/2020 CLINICAL DATA:  Acute, diffuse abdominal pain and scrotal swelling. Lower pelvic swelling. Previous cholecystectomy and hernia repair. EXAM: CT ABDOMEN AND PELVIS WITH CONTRAST TECHNIQUE: Multidetector CT imaging of the abdomen and pelvis was performed using the standard protocol following bolus administration of intravenous contrast. CONTRAST:  166mL OMNIPAQUE IOHEXOL 300 MG/ML  SOLN COMPARISON:  08/30/2018 FINDINGS: Lower chest: Enlarged heart, including biatrial and left ventricular enlargement. Clear lung bases. Hepatobiliary: Mild diffuse low density of the liver relative to the spleen. Cholecystectomy clips. Pancreas: Mild to moderate diffuse atrophy. Spleen: Normal in size without focal abnormality. Adrenals/Urinary Tract: Normal appearing adrenal glands. Small lower pole left renal cyst. Unremarkable ureters and urinary bladder. Stomach/Bowel: Multiple colonic diverticula containing high-density material. Interval soft tissue stranding involving the proximal sigmoid colon in an area of diverticula, with a tiny extraluminal collection of air with no extraluminal fluid collections. Normal appearing appendix containing high density material or a partially calcified appendicolith distally. Small hiatal hernia. Unremarkable small bowel. Vascular/Lymphatic: Atheromatous arterial calcifications without aneurysm. Interval inclusion of a mildly enlarged left inguinal lymph node with minimal adjacent soft tissue stranding. This is only partially included  previously and currently has a short axis diameter of 13 mm on image number 95 series 2. This is unchanged since 03/23/2014. A mildly enlarged right inguinal lymph node without adjacent soft tissue stranding has a short axis diameter of 9 mm on image number 96 series 2, previously 10 mm and unchanged since 03/23/2014. Enlarged right common femoral node has not changed significantly, with a short axis diameter of 20 mm on image number 75 series 2. This is also stable since 03/23/2014. Reproductive: Moderately enlarged prostate gland. Other: Small bilateral inguinal hernias containing fat. Bilateral vas deferens clips. Bilateral hydroceles. Musculoskeletal: No acute bony abnormality. IMPRESSION: 1. Acute sigmoid colon diverticulitis with a tiny extraluminal collection of air without abscess. 2. Extensive colonic diverticulosis. 3. Small hiatal hernia. 4. Mild diffuse hepatic steatosis. 5. Moderately enlarged prostate gland. 6. Small bilateral inguinal hernias containing fat. 7. Bilateral hydroceles. 8. Stable mild bilateral inguinal and right common femoral adenopathy. The long-term stability is compatible with a benign process. 9. Cardiomegaly. Electronically Signed   By: Claudie Revering M.D.   On: 09/01/2020 19:13   US SCROTUM DOPPLER  Result Date: 09/01/2020 CLINICAL DATA:  Right scrotal pain and swelling for several months. EXAM: SCROTAL ULTRASOUND DOPPLER ULTRASOUND OF THE TESTICLES TECHNIQUE: Complete ultrasound examination of the testicles, epididymis, and other scrotal structures was performed. Color  and spectral Doppler ultrasound were also utilized to evaluate blood flow to the testicles. COMPARISON:  None. FINDINGS: Right testicle Measurements: 4.3 x 3.3 x 3.2 cm. No mass or microlithiasis visualized. Left testicle Measurements: 4.4 x 2.9 x 3.4 cm. No mass or microlithiasis visualized. 7 mm calculus along the left posterior testicular wall. Right epididymis: Normal in size and appearance. 1.2 cm right  epididymal cyst noted. Left epididymis:  Normal in size and appearance. Hydrocele:  Small right hydrocele. Varicocele:  None visualized. Pulsed Doppler interrogation of both testes demonstrates normal low resistance arterial and venous waveforms bilaterally. IMPRESSION: 1. Normal appearance of the testicles. 2. 1.2 cm right epididymal cyst. 3. Small right hydrocele. Electronically Signed   By: Fidela Salisbury M.D.   On: 09/01/2020 18:46    Medications / Allergies: per chart  Antibiotics: Anti-infectives (From admission, onward)   Start     Dose/Rate Route Frequency Ordered Stop   09/02/20 0600  piperacillin-tazobactam (ZOSYN) IVPB 3.375 g        3.375 g 12.5 mL/hr over 240 Minutes Intravenous Every 8 hours 09/01/20 2250     09/01/20 2115  piperacillin-tazobactam (ZOSYN) IVPB 3.375 g        3.375 g 100 mL/hr over 30 Minutes Intravenous  Once 09/01/20 2110 09/01/20 2227        Note: Portions of this report may have been transcribed using voice recognition software. Every effort was made to ensure accuracy; however, inadvertent computerized transcription errors may be present.   Any transcriptional errors that result from this process are unintentional.    Adin Hector, MD, FACS, MASCRS  Esophageal, Gastrointestinal & Colorectal Surgery Robotic and Minimally Invasive Surgery [facility] [address], [place], [place] [address] [phone] Fax [phone] Main/Paging  CONTACT INFORMATION: Weekday (9AM-5PM) concerns: Call [facility] main office at [phone] Weeknight (5PM-9AM) or Weekend/Holiday concerns: Check [email or web address] for General Surgery [facility] coverage (Please, do not use SecureChat as it is not reliable communication to operating surgeons for immediate patient care)      09/02/2020  7:27 AM

## 2020-09-03 LAB — COMPREHENSIVE METABOLIC PANEL
ALT: 14 U/L (ref 0–44)
AST: 14 U/L — ABNORMAL LOW (ref 15–41)
Albumin: 3.3 g/dL — ABNORMAL LOW (ref 3.5–5.0)
Alkaline Phosphatase: 73 U/L (ref 38–126)
Anion gap: 5 (ref 5–15)
BUN: 9 mg/dL (ref 8–23)
CO2: 24 mmol/L (ref 22–32)
Calcium: 8.4 mg/dL — ABNORMAL LOW (ref 8.9–10.3)
Chloride: 107 mmol/L (ref 98–111)
Creatinine, Ser: 1.24 mg/dL (ref 0.61–1.24)
GFR, Estimated: >60 mL/min (ref >60)
Glucose, Bld: 99 mg/dL (ref 70–99)
Potassium: 3.7 mmol/L (ref 3.5–5.1)
Sodium: 136 mmol/L (ref 135–145)
Total Bilirubin: 1.8 mg/dL — ABNORMAL HIGH (ref 0.3–1.2)
Total Protein: 6.3 g/dL — ABNORMAL LOW (ref 6.5–8.1)

## 2020-09-03 LAB — CBC
HCT: 41.4 % (ref 39.0–52.0)
Hemoglobin: 13.7 g/dL (ref 13.0–17.0)
MCH: 31.1 pg (ref 26.0–34.0)
MCHC: 33.1 g/dL (ref 30.0–36.0)
MCV: 94.1 fL (ref 80.0–100.0)
Platelets: 168 10*3/uL (ref 150–400)
RBC: 4.4 MIL/uL (ref 4.22–5.81)
RDW: 12.1 % (ref 11.5–15.5)
WBC: 2.9 10*3/uL — ABNORMAL LOW (ref 4.0–10.5)
nRBC: 0 % (ref 0.0–0.2)

## 2020-09-03 LAB — PROTIME-INR
INR: 1.2 (ref 0.8–1.2)
Prothrombin Time: 14.3 seconds (ref 11.4–15.2)

## 2020-09-03 LAB — PHOSPHORUS: Phosphorus: 3.5 mg/dL (ref 2.5–4.6)

## 2020-09-03 LAB — MAGNESIUM: Magnesium: 1.9 mg/dL (ref 1.7–2.4)

## 2020-09-03 NOTE — Progress Notes (Signed)
PROGRESS NOTE  Chad Guerra:[id number] DOB: 1951-02-08 DOA: 09/01/2020 PCP: [name], MD   LOS: 1 day   Brief narrative: 70 year old male with past medical history of paroxysmal atrial fibrillation (on Eliquis), hypertension, benign prostatic hyperplasia and f recurrent diverticulitis (2010, 2013, 2015, 2020) who presented to the [facility] with complaints of abdominal and scrotal pain for 2 weeks.  In the ED, patient had a CT scan of the abdomen and pelvis which showed evidence of acute sigmoid colon diverticulitis with a small amount of extraluminal air identified concerning for microperforation.  Patient was then initiated on intravenous Zosyn, intravenous fluids.    General surgery was consulted and the patient was admitted to hospital for further evaluation and treatment.    Assessment/Plan:  Principal Problem:   Diverticulitis of large intestine with perforation without abscess or bleeding Active Problems:   Adenomatous polyp of colon   Adenoma of left adrenal gland   BPH (benign prostatic hyperplasia)   Neutropenia - chronic   Paroxysmal atrial fibrillation (HCC)   Primary open angle glaucoma   History of colonic diverticulitis   Chronic anticoagulation   Bilateral inguinal hernia (BIH)   Obesity (BMI 30-39.9)   Hypertensive urgency   Bilateral hydrocele   Acute diverticulitis  Diverticulitis of large intestine with perforation without abscess or bleeding History of recurrent diverticulitis.  Continue  IV Zosyn, IV fluids, supportive care.  Surgery on board.  Patient was seen by surgery today and has been advanced to soft diet.  Clinically improving.    Hypertensive urgency On presentation likely exacerbated by pain.  Continue atenolol, hydralazine, lisinopril.       History of  questionable adenoma of left adrenal gland Mention of "mild nodular thickening of the bilateral adrenal glands with left-sided adrenal adenoma" [facility] with mention of plan to proceed with pheochromocytoma work-up.  Repeat CT scan this time did not mention any imaging evidence of adrenal enlargement or adenoma.  Clinical picture is also not consistent with pheochromocytoma at this time    BPH (benign prostatic hyperplasia)  On Proscar and Flomax    Paroxysmal atrial fibrillation  On Eliquis, atenolol at home.  Currently rate controlled. Eliquis on hold for possible surgical intervention.  On Lovenox subcu    Primary open angle glaucoma Continue Alphagan.   Inguinal reducible hernia with Bilateral hydrocele Patient follows up with the [place] on this.  Chronic  neutropenia.  Followed by hematology at the [facility].   CBC Latest Ref Rng & Units 09/03/2020 09/02/2020 09/01/2020  WBC 4.0 - 10.5 K/uL 2.9(L) 2.7(L) 3.4(L)  Hemoglobin 13.0 - 17.0 g/dL 13.7 13.5 13.8  Hematocrit 39.0 - 52.0 % 41.4 40.7 41.1  Platelets 150 - 400 K/uL 168 162 167    DVT prophylaxis: Lovenox subcu  Code Status: Full code  Family Communication: None  Status is: Inpatient  The patient is inpatient because: IV treatments appropriate due to intensity of illness or inability to take PO, Inpatient level of care appropriate due to severity of illness , IV antibiotics, surgical follow-up  Dispo: The patient is from: Home              Anticipated d/c is to: Home likely by tomorrow if able to tolerate p.o. diet              Patient currently is not medically stable to d/c.   Difficult to place patient No   Consultants:  General surgery  Procedures:  None  Anti-infectives:  Marland Kitchen Zosyn 4/10>  Anti-infectives (From admission, onward)   Start     Dose/Rate Route Frequency Ordered Stop   09/02/20 0600  piperacillin-tazobactam (ZOSYN) IVPB 3.375 g        3.375 g 12.5 mL/hr over 240 Minutes Intravenous Every 8 hours 09/01/20 2250     09/01/20 2115  piperacillin-tazobactam (ZOSYN) IVPB 3.375 g        3.375 g 100 mL/hr over 30 Minutes Intravenous  Once 09/01/20 2110  09/01/20 2227     Subjective: Today, patient was seen and examined at bedside.  Has mild nausea and minimal pain.  Has tolerated clears.  Has been advanced to soft diet today.  Has not had a bowel movement but has been passing gas.  No vomiting.  Objective: Vitals:   09/02/20 2116 09/03/20 0552  BP: (!) 159/89 117/71  Pulse: (!) 54 (!) 52  Resp: 20 20  Temp: (!) 97.4 F (36.3 C) 97.8 F (36.6 C)  SpO2: 98% 97%    Intake/Output Summary (Last 24 hours) at 09/03/2020 1052 Last data filed at 09/03/2020 0503 Gross per 24 hour  Intake 400 ml  Output 1975 ml  Net -1575 ml   Filed Weights   09/02/20 1059  Weight: 101.3 kg   Body mass index is 31.59 kg/m.   Physical Exam: GENERAL: Patient is alert awake and oriented. Not in obvious distress.  Obese HENT: No scleral pallor or icterus. Pupils equally reactive to light. Oral mucosa is moist NECK: is supple, no gross swelling noted. CHEST: Clear to auscultation. No crackles or wheezes.  Diminished breath sounds bilaterally. CVS: S1 and S2 heard, no murmur. Regular rate and rhythm.  ABDOMEN: Soft, mild tenderness over the left lower quadrant.    EXTREMITIES: No edema.  Left knee osteoarthritis with joint tenderness.  Left lower extremity scar present. CNS: Cranial nerves are intact. No focal motor deficits. SKIN: warm and dry without rashes.  Data Review: I have personally reviewed the following laboratory data and studies,  CBC: Recent Labs  Lab 09/01/20 1624 09/02/20 0525 09/03/20 0538  WBC 3.4* 2.7* 2.9*  NEUTROABS 2.0 1.2*  --   HGB 13.8 13.5 13.7  HCT 41.1 40.7 41.4  MCV 93.8 92.5 94.1  PLT 167 162 683   Basic Metabolic Panel: Recent Labs  Lab 09/01/20 1624 09/02/20 0525 09/03/20 0538  NA 136 138 136  K 3.6 3.3* 3.7  CL 107 104 107  CO2 24 25 24   GLUCOSE 96 98 99  BUN 15 11 9   CREATININE 1.10 1.02 1.24  CALCIUM 8.5* 8.4* 8.4*  MG  --  1.9 1.9  PHOS  --   --  3.5   Liver Function Tests: Recent Labs   Lab 09/01/20 1624 09/02/20 0525 09/03/20 0538  AST 17 15 14*  ALT 16 16 14   ALKPHOS 89 79 73  BILITOT 1.0 1.6* 1.8*  PROT 6.9 6.3* 6.3*  ALBUMIN 3.9 3.2* 3.3*   Recent Labs  Lab 09/01/20 1624  LIPASE 50   No results for input(s): AMMONIA in the last 168 hours. Cardiac Enzymes: No results for input(s): CKTOTAL, CKMB, CKMBINDEX, TROPONINI in the last 168 hours. BNP (last 3 results) No results for input(s): BNP in the last 8760 hours.  ProBNP (last 3 results) No results for input(s): PROBNP in the last 8760 hours.  CBG: No results for input(s): GLUCAP in the last 168 hours. Recent Results (from the past 240 hour(s))  SARS CORONAVIRUS 2 (TAT 6-24 HRS) Nasopharyngeal  Nasopharyngeal Swab     Status: None   Collection Time: 09/01/20  9:44 PM   Specimen: Nasopharyngeal Swab  Result Value Ref Range Status   SARS Coronavirus 2 NEGATIVE NEGATIVE Final    Comment: (NOTE) SARS-CoV-2 target nucleic acids are NOT DETECTED.  The SARS-CoV-2 RNA is generally detectable in upper and lower respiratory specimens during the acute phase of infection. Negative results do not preclude SARS-CoV-2 infection, do not rule out co-infections with other pathogens, and should not be used as the sole basis for treatment or other patient management decisions. Negative results must be combined with clinical observations, patient history, and epidemiological information. The expected result is Negative.  Fact Sheet for Patients: SugarRoll.be  Fact Sheet for Healthcare Providers: https://www.woods-mathews.com/  This test is not yet approved or cleared by the [place] FDA and  has been authorized for detection and/or diagnosis of SARS-CoV-2 by FDA under an Emergency Use Authorization (EUA). This EUA will remain  in effect (meaning this test can be used) for the duration of the COVID-19 declaration under Se ction 564(b)(1) of the Act, 21 U.S.C. section  360bbb-3(b)(1), unless the authorization is terminated or revoked sooner.  Performed at [facility], [place] [address] [place], [place] [address]      Studies: CT ABDOMEN PELVIS W CONTRAST  Result Date: 09/01/2020 CLINICAL DATA:  Acute, diffuse abdominal pain and scrotal swelling. Lower pelvic swelling. Previous cholecystectomy and hernia repair. EXAM: CT ABDOMEN AND PELVIS WITH CONTRAST TECHNIQUE: Multidetector CT imaging of the abdomen and pelvis was performed using the standard protocol following bolus administration of intravenous contrast. CONTRAST:  160mL OMNIPAQUE IOHEXOL 300 MG/ML  SOLN COMPARISON:  08/30/2018 FINDINGS: Lower chest: Enlarged heart, including biatrial and left ventricular enlargement. Clear lung bases. Hepatobiliary: Mild diffuse low density of the liver relative to the spleen. Cholecystectomy clips. Pancreas: Mild to moderate diffuse atrophy. Spleen: Normal in size without focal abnormality. Adrenals/Urinary Tract: Normal appearing adrenal glands. Small lower pole left renal cyst. Unremarkable ureters and urinary bladder. Stomach/Bowel: Multiple colonic diverticula containing high-density material. Interval soft tissue stranding involving the proximal sigmoid colon in an area of diverticula, with a tiny extraluminal collection of air with no extraluminal fluid collections. Normal appearing appendix containing high density material or a partially calcified appendicolith distally. Small hiatal hernia. Unremarkable small bowel. Vascular/Lymphatic: Atheromatous arterial calcifications without aneurysm. Interval inclusion of a mildly enlarged left inguinal lymph node with minimal adjacent soft tissue stranding. This is only partially included previously and currently has a short axis diameter of 13 mm on image number 95 series 2. This is unchanged since 03/23/2014. A mildly enlarged right inguinal lymph node without adjacent soft tissue stranding has a short axis diameter of 9 mm  on image number 96 series 2, previously 10 mm and unchanged since 03/23/2014. Enlarged right common femoral node has not changed significantly, with a short axis diameter of 20 mm on image number 75 series 2. This is also stable since 03/23/2014. Reproductive: Moderately enlarged prostate gland. Other: Small bilateral inguinal hernias containing fat. Bilateral vas deferens clips. Bilateral hydroceles. Musculoskeletal: No acute bony abnormality. IMPRESSION: 1. Acute sigmoid colon diverticulitis with a tiny extraluminal collection of air without abscess. 2. Extensive colonic diverticulosis. 3. Small hiatal hernia. 4. Mild diffuse hepatic steatosis. 5. Moderately enlarged prostate gland. 6. Small bilateral inguinal hernias containing fat. 7. Bilateral hydroceles. 8. Stable mild bilateral inguinal and right common femoral adenopathy. The long-term stability is compatible with a benign process. 9. Cardiomegaly. Electronically Signed  By: Claudie Revering M.D.   On: 09/01/2020 19:13   US SCROTUM DOPPLER  Result Date: 09/01/2020 CLINICAL DATA:  Right scrotal pain and swelling for several months. EXAM: SCROTAL ULTRASOUND DOPPLER ULTRASOUND OF THE TESTICLES TECHNIQUE: Complete ultrasound examination of the testicles, epididymis, and other scrotal structures was performed. Color and spectral Doppler ultrasound were also utilized to evaluate blood flow to the testicles. COMPARISON:  None. FINDINGS: Right testicle Measurements: 4.3 x 3.3 x 3.2 cm. No mass or microlithiasis visualized. Left testicle Measurements: 4.4 x 2.9 x 3.4 cm. No mass or microlithiasis visualized. 7 mm calculus along the left posterior testicular wall. Right epididymis: Normal in size and appearance. 1.2 cm right epididymal cyst noted. Left epididymis:  Normal in size and appearance. Hydrocele:  Small right hydrocele. Varicocele:  None visualized. Pulsed Doppler interrogation of both testes demonstrates normal low resistance arterial and venous waveforms  bilaterally. IMPRESSION: 1. Normal appearance of the testicles. 2. 1.2 cm right epididymal cyst. 3. Small right hydrocele. Electronically Signed   By: Fidela Salisbury M.D.   On: 09/01/2020 18:46      Flora Lipps, MD  [facility] 09/03/2020  If 7PM-7AM, please contact night-coverage

## 2020-09-03 NOTE — Progress Notes (Signed)
[facility] Progress Note     Subjective: CC:  Reports mild lower abdominal pain, improved compared to yesterday. Having flatus. Denies BM. Denies fever, chills, nausea, vomiting. States he feels hungry. Denies abd distention. Reports he has upcoming surgeries scheduled for his L knee and R eye. Is in [place] [facility]. Wife at bedside.  Objective: Vital signs in last 24 hours: Temp:  [97.4 F (36.3 C)-98 F (36.7 C)] 97.8 F (36.6 C) (04/11 0552) Pulse Rate:  [51-56] 52 (04/11 0552) Resp:  [16-20] 20 (04/11 0552) BP: (117-159)/(71-89) 117/71 (04/11 0552) SpO2:  [93 %-98 %] 97 % (04/11 0552) Weight:  [101.3 kg] 101.3 kg (04/10 1059) Last BM Date: 08/31/20  Intake/Output from previous day: 04/10 0701 - 04/11 0700 In: 442.9 [IV Piggyback:442.9] Out: 2825 [Urine:2825] Intake/Output this shift: No intake/output data recorded.  PE: Gen:  Alert, NAD, pleasant Card:  Regular rate and rhythm, pedal pulses 2+ BL Pulm:  Normal effort, clear to auscultation bilaterally Abd: Soft, mild TTP suprapubic region without guarding, non-distended, bowel sounds present in all 4 quadrants, previous laparoscopic and umbilical surgical scars from umbilical hernia repair with mesh. Skin: warm and dry, no rashes  Psych: A&Ox3   Lab Results:  Recent Labs    09/02/20 0525 09/03/20 0538  WBC 2.7* 2.9*  HGB 13.5 13.7  HCT 40.7 41.4  PLT 162 168   BMET Recent Labs    09/02/20 0525 09/03/20 0538  NA 138 136  K 3.3* 3.7  CL 104 107  CO2 25 24  GLUCOSE 98 99  BUN 11 9  CREATININE 1.02 1.24  CALCIUM 8.4* 8.4*   PT/INR Recent Labs    09/03/20 0538  LABPROT 14.3  INR 1.2   CMP     Component Value Date/Time   NA 136 09/03/2020 0538   K 3.7 09/03/2020 0538   CL 107 09/03/2020 0538   CO2 24 09/03/2020 0538   GLUCOSE 99 09/03/2020 0538   BUN 9 09/03/2020 0538   CREATININE 1.24 09/03/2020 0538   CALCIUM 8.4 (L) 09/03/2020 0538   PROT 6.3 (L) 09/03/2020 0538   ALBUMIN  3.3 (L) 09/03/2020 0538   AST 14 (L) 09/03/2020 0538   ALT 14 09/03/2020 0538   ALKPHOS 73 09/03/2020 0538   BILITOT 1.8 (H) 09/03/2020 0538   GFRNONAA >60 09/03/2020 0538   GFRAA 81 (L) 03/23/2014 1650   Lipase     Component Value Date/Time   LIPASE 50 09/01/2020 1624       Studies/Results: CT ABDOMEN PELVIS W CONTRAST  Result Date: 09/01/2020 CLINICAL DATA:  Acute, diffuse abdominal pain and scrotal swelling. Lower pelvic swelling. Previous cholecystectomy and hernia repair. EXAM: CT ABDOMEN AND PELVIS WITH CONTRAST TECHNIQUE: Multidetector CT imaging of the abdomen and pelvis was performed using the standard protocol following bolus administration of intravenous contrast. CONTRAST:  166mL OMNIPAQUE IOHEXOL 300 MG/ML  SOLN COMPARISON:  08/30/2018 FINDINGS: Lower chest: Enlarged heart, including biatrial and left ventricular enlargement. Clear lung bases. Hepatobiliary: Mild diffuse low density of the liver relative to the spleen. Cholecystectomy clips. Pancreas: Mild to moderate diffuse atrophy. Spleen: Normal in size without focal abnormality. Adrenals/Urinary Tract: Normal appearing adrenal glands. Small lower pole left renal cyst. Unremarkable ureters and urinary bladder. Stomach/Bowel: Multiple colonic diverticula containing high-density material. Interval soft tissue stranding involving the proximal sigmoid colon in an area of diverticula, with a tiny extraluminal collection of air with no extraluminal fluid collections. Normal appearing appendix containing high density material or a partially  calcified appendicolith distally. Small hiatal hernia. Unremarkable small bowel. Vascular/Lymphatic: Atheromatous arterial calcifications without aneurysm. Interval inclusion of a mildly enlarged left inguinal lymph node with minimal adjacent soft tissue stranding. This is only partially included previously and currently has a short axis diameter of 13 mm on image number 95 series 2. This is unchanged  since 03/23/2014. A mildly enlarged right inguinal lymph node without adjacent soft tissue stranding has a short axis diameter of 9 mm on image number 96 series 2, previously 10 mm and unchanged since 03/23/2014. Enlarged right common femoral node has not changed significantly, with a short axis diameter of 20 mm on image number 75 series 2. This is also stable since 03/23/2014. Reproductive: Moderately enlarged prostate gland. Other: Small bilateral inguinal hernias containing fat. Bilateral vas deferens clips. Bilateral hydroceles. Musculoskeletal: No acute bony abnormality. IMPRESSION: 1. Acute sigmoid colon diverticulitis with a tiny extraluminal collection of air without abscess. 2. Extensive colonic diverticulosis. 3. Small hiatal hernia. 4. Mild diffuse hepatic steatosis. 5. Moderately enlarged prostate gland. 6. Small bilateral inguinal hernias containing fat. 7. Bilateral hydroceles. 8. Stable mild bilateral inguinal and right common femoral adenopathy. The long-term stability is compatible with a benign process. 9. Cardiomegaly. Electronically Signed   By: Claudie Revering M.D.   On: 09/01/2020 19:13   US SCROTUM DOPPLER  Result Date: 09/01/2020 CLINICAL DATA:  Right scrotal pain and swelling for several months. EXAM: SCROTAL ULTRASOUND DOPPLER ULTRASOUND OF THE TESTICLES TECHNIQUE: Complete ultrasound examination of the testicles, epididymis, and other scrotal structures was performed. Color and spectral Doppler ultrasound were also utilized to evaluate blood flow to the testicles. COMPARISON:  None. FINDINGS: Right testicle Measurements: 4.3 x 3.3 x 3.2 cm. No mass or microlithiasis visualized. Left testicle Measurements: 4.4 x 2.9 x 3.4 cm. No mass or microlithiasis visualized. 7 mm calculus along the left posterior testicular wall. Right epididymis: Normal in size and appearance. 1.2 cm right epididymal cyst noted. Left epididymis:  Normal in size and appearance. Hydrocele:  Small right hydrocele.  Varicocele:  None visualized. Pulsed Doppler interrogation of both testes demonstrates normal low resistance arterial and venous waveforms bilaterally. IMPRESSION: 1. Normal appearance of the testicles. 2. 1.2 cm right epididymal cyst. 3. Small right hydrocele. Electronically Signed   By: Fidela Salisbury M.D.   On: 09/01/2020 18:46    Anti-infectives: Anti-infectives (From admission, onward)   Start     Dose/Rate Route Frequency Ordered Stop   09/02/20 0600  piperacillin-tazobactam (ZOSYN) IVPB 3.375 g        3.375 g 12.5 mL/hr over 240 Minutes Intravenous Every 8 hours 09/01/20 2250     09/01/20 2115  piperacillin-tazobactam (ZOSYN) IVPB 3.375 g        3.375 g 100 mL/hr over 30 Minutes Intravenous  Once 09/01/20 2110 09/01/20 2227       Assessment/Plan Recurrent sigmoid diverticulitis with perforation, without abscess - afebrile, VSS - clinically improving with less pain. Having a lot of flatus but no BM - advance to SOFT diet, continue IV abx  - if continue to clinically improve may be able to D/C home tomorrow on PO abx from surgical perspective. - Patient would benefit from segmental colonic resection to break the cycle of attacks since he has had numerous attacks and this one is complicated.  He is overdue for colonoscopy.  Per Dr. Johney Maine, Ideally would wait 6 weeks from this attack, colonoscopy the day before surgery, robotic colectomy.Would like medical and cardiac clearance.  [facility]   LOS: 1 day    Obie Dredge, [facility] Surgery Please see Amion for pager number during day hours 7:00am-4:30pm

## 2020-09-04 ENCOUNTER — Other Ambulatory Visit: Payer: Self-pay | Admitting: Internal Medicine

## 2020-09-04 DIAGNOSIS — D3502 Benign neoplasm of left adrenal gland: Secondary | ICD-10-CM

## 2020-09-04 MED ORDER — DOCUSATE SODIUM 100 MG PO CAPS: 100.0000 mg | ORAL_CAPSULE | Freq: Every day | ORAL | 2 refills | Status: AC

## 2020-09-04 MED ORDER — AMOXICILLIN-POT CLAVULANATE 875-125 MG PO TABS: 1.0000 | ORAL_TABLET | Freq: Two times a day (BID) | ORAL | 0 refills | Status: DC

## 2020-09-04 MED ORDER — DOCUSATE SODIUM 100 MG PO CAPS: 100.0000 mg | ORAL_CAPSULE | Freq: Every day | ORAL | 2 refills | Status: DC

## 2020-09-04 NOTE — Discharge Summary (Signed)
Physician Discharge Summary  Chad Chad Guerra QQP:[id number] DOB: June 19, 1950 DOA: 09/01/2020  PCP: [name], MD  Admit date: 09/01/2020 Discharge date: 09/04/2020  Admitted From: Home  Discharge disposition: Home   Recommendations for Outpatient Follow-Up:   . Follow up with your primary care provider in one week.  . Check CBC, BMP, magnesium in the next visit . Please follow-up with your primary care provider at the [facility], discuss about getting a GI referral for colonoscopy in 4 to 6 weeks. . Patient would benefit from having a colorectal surgeon follow-up due to history of recurrent diverticulitis.  Discharge Diagnosis:   Principal Problem:   Diverticulitis of large intestine with perforation without abscess or bleeding Active Problems:   Adenomatous polyp of colon   Adenoma of left adrenal gland   BPH (benign prostatic hyperplasia)   Neutropenia - chronic   Paroxysmal atrial fibrillation (HCC)   Primary open angle glaucoma   History of colonic diverticulitis   Chronic anticoagulation   Bilateral inguinal hernia (BIH)   Obesity (BMI 30-39.9)   Hypertensive urgency   Bilateral hydrocele   Acute diverticulitis   Discharge Condition: Improved.  Diet recommendation: Low sodium, heart healthy.    Wound care: None.  Code status: Full.   History of Present Illness:   70 year old male with past medical history of paroxysmal atrial fibrillation(on Eliquis),hypertension, benign prostatic hyperplasia and f recurrent diverticulitis (2010, 2013, 2015, 2020)who presented to the [facility] long hospital with complaints of abdominal and scrotal pain for 2 weeks.  In the ED, patient had a CT scan of the abdomen and pelvis which showed evidence of acute sigmoid colon diverticulitis with a small amount of extraluminal air identified concerning for microperforation. Patient was then initiated on intravenous Zosyn, intravenous fluids.   General surgery was  consulted and the patient was admitted to hospital for further evaluation and treatment.  Hospital Course:   Following conditions were addressed during hospitalization as listed below,  Diverticulitis of large intestine with perforation without abscess or bleeding History of recurrent diverticulitis.  Patient was continued on IV Zosyn, IV fluids, supportive care with improvement in his symptoms.  He was gradually advanced on diet and tolerated.  General surgery recommended outpatient follow-up with GI in 4 to 6 weeks for colonoscopy.  He does have history of recurrent diverticulitis and might benefit from colorectal surgeon as outpatient.   Hypertensive urgency On presentation likely exacerbated by pain.  Continue atenolol, lisinopril.      History of questionable adenoma of left adrenal gland Mention of "mild nodular thickening of the bilateral adrenal glands with left-sided adrenal adenoma" [facility] with mention of plan to proceed with pheochromocytoma work-up.  Repeat CT scan this time did not mention any imaging evidence of adrenal enlargement or adenoma.  Clinical picture is also not consistent with pheochromocytoma at this time.  Follow-up with your primary care physician about it.  BPH (benign prostatic hyperplasia)  Continue Proscar and Flomax  Paroxysmal atrial fibrillation  On Eliquis, atenolol at home.  Currently rate controlled.  Patient will resume oral medications on discharge  Primary open angle glaucoma Continue Alphagan.  Inguinal reducible hernia withBilateral hydrocele Patient follows up with the [facility] on this.  Chronic  neutropenia.  Followed by hematology at the [facility].  no acute issues while in the hospital  Disposition.  At this time, patient is stable for disposition home with outpatient PCP follow-up.  Medical Consultants:    General surgery  Procedures:  None Subjective:   Today, patient was seen and examined at bedside.   Has tolerated oral diet.  Denies pain.  No nausea vomiting.  Has had bowel movements.  Discharge Exam:   Vitals:   09/04/20 0519 09/04/20 0815  BP: (!) 143/97 (!) 149/41  Pulse: (!) 58 (!) 57  Resp: 16 16  Temp: 97.7 F (36.5 C) 98.1 F (36.7 C)  SpO2: 97% 97%   Vitals:   09/03/20 1236 09/03/20 2039 09/04/20 0519 09/04/20 0815  BP: 128/82 (!) 143/89 (!) 143/97 (!) 149/41  Pulse: (!) 56 60 (!) 58 (!) 57  Resp: 18 18 16 16   Temp: (!) 97.4 F (36.3 C) 97.7 F (36.5 C) 97.7 F (36.5 C) 98.1 F (36.7 C)  TempSrc: Oral   Oral  SpO2: 98% 100% 97% 97%  Weight:      Height:       General: Alert awake, not in obvious distress HENT: pupils equally reacting to light,  No scleral pallor or icterus noted. Oral mucosa is moist.  Chest:  Clear breath sounds.  Diminished breath sounds bilaterally. No crackles or wheezes.  CVS: S1 &S2 heard. No murmur.  Regular rate and rhythm. Abdomen: Soft, nontender, nondistended.  Minimal tenderness over the left lower quadrant on deep palpation.  Bowel sounds are heard.   Extremities: No cyanosis, clubbing or edema.  Peripheral pulses are palpable.  Left knee osteoarthritis, left lower extremity scar Psych: Alert, awake and oriented, normal mood CNS:  No cranial nerve deficits.  Power equal in all extremities.   Skin: Warm and dry.  No rashes noted.  The results of significant diagnostics from this hospitalization (including imaging, microbiology, ancillary and laboratory) are listed below for reference.     Diagnostic Studies:   CT ABDOMEN PELVIS W CONTRAST  Result Date: 09/01/2020 CLINICAL DATA:  Acute, diffuse abdominal pain and scrotal swelling. Lower pelvic swelling. Previous cholecystectomy and hernia repair. EXAM: CT ABDOMEN AND PELVIS WITH CONTRAST TECHNIQUE: Multidetector CT imaging of the abdomen and pelvis was performed using the standard protocol following bolus administration of intravenous contrast. CONTRAST:  146mL OMNIPAQUE IOHEXOL  300 MG/ML  SOLN COMPARISON:  08/30/2018 FINDINGS: Lower chest: Enlarged heart, including biatrial and left ventricular enlargement. Clear lung bases. Hepatobiliary: Mild diffuse low density of the liver relative to the spleen. Cholecystectomy clips. Pancreas: Mild to moderate diffuse atrophy. Spleen: Normal in size without focal abnormality. Adrenals/Urinary Tract: Normal appearing adrenal glands. Small lower pole left renal cyst. Unremarkable ureters and urinary bladder. Stomach/Bowel: Multiple colonic diverticula containing high-density material. Interval soft tissue stranding involving the proximal sigmoid colon in an area of diverticula, with a tiny extraluminal collection of air with no extraluminal fluid collections. Normal appearing appendix containing high density material or a partially calcified appendicolith distally. Small hiatal hernia. Unremarkable small bowel. Vascular/Lymphatic: Atheromatous arterial calcifications without aneurysm. Interval inclusion of a mildly enlarged left inguinal lymph node with minimal adjacent soft tissue stranding. This is only partially included previously and currently has a short axis diameter of 13 mm on image number 95 series 2. This is unchanged since 03/23/2014. A mildly enlarged right inguinal lymph node without adjacent soft tissue stranding has a short axis diameter of 9 mm on image number 96 series 2, previously 10 mm and unchanged since 03/23/2014. Enlarged right common femoral node has not changed significantly, with a short axis diameter of 20 mm on image number 75 series 2. This is also stable since 03/23/2014. Reproductive: Moderately enlarged prostate gland. Other: Small bilateral  inguinal hernias containing fat. Bilateral vas deferens clips. Bilateral hydroceles. Musculoskeletal: No acute bony abnormality. IMPRESSION: 1. Acute sigmoid colon diverticulitis with a tiny extraluminal collection of air without abscess. 2. Extensive colonic diverticulosis. 3.  Small hiatal hernia. 4. Mild diffuse hepatic steatosis. 5. Moderately enlarged prostate gland. 6. Small bilateral inguinal hernias containing fat. 7. Bilateral hydroceles. 8. Stable mild bilateral inguinal and right common femoral adenopathy. The long-term stability is compatible with a benign process. 9. Cardiomegaly. Electronically Signed   By: Claudie Revering M.D.   On: 09/01/2020 19:13   US SCROTUM DOPPLER  Result Date: 09/01/2020 CLINICAL DATA:  Right scrotal pain and swelling for several months. EXAM: SCROTAL ULTRASOUND DOPPLER ULTRASOUND OF THE TESTICLES TECHNIQUE: Complete ultrasound examination of the testicles, epididymis, and other scrotal structures was performed. Color and spectral Doppler ultrasound were also utilized to evaluate blood flow to the testicles. COMPARISON:  None. FINDINGS: Right testicle Measurements: 4.3 x 3.3 x 3.2 cm. No mass or microlithiasis visualized. Left testicle Measurements: 4.4 x 2.9 x 3.4 cm. No mass or microlithiasis visualized. 7 mm calculus along the left posterior testicular wall. Right epididymis: Normal in size and appearance. 1.2 cm right epididymal cyst noted. Left epididymis:  Normal in size and appearance. Hydrocele:  Small right hydrocele. Varicocele:  None visualized. Pulsed Doppler interrogation of both testes demonstrates normal low resistance arterial and venous waveforms bilaterally. IMPRESSION: 1. Normal appearance of the testicles. 2. 1.2 cm right epididymal cyst. 3. Small right hydrocele. Electronically Signed   By: Fidela Salisbury M.D.   On: 09/01/2020 18:46     Labs:   Basic Metabolic Panel: Recent Labs  Lab 09/01/20 1624 09/02/20 0525 09/03/20 0538  NA 136 138 136  K 3.6 3.3* 3.7  CL 107 104 107  CO2 24 25 24   GLUCOSE 96 98 99  BUN 15 11 9   CREATININE 1.10 1.02 1.24  CALCIUM 8.5* 8.4* 8.4*  MG  --  1.9 1.9  PHOS  --   --  3.5   GFR Estimated Creatinine Clearance: 68.5 mL/min (by C-G formula based on SCr of 1.24 mg/dL). Liver  Function Tests: Recent Labs  Lab 09/01/20 1624 09/02/20 0525 09/03/20 0538  AST 17 15 14*  ALT 16 16 14   ALKPHOS 89 79 73  BILITOT 1.0 1.6* 1.8*  PROT 6.9 6.3* 6.3*  ALBUMIN 3.9 3.2* 3.3*   Recent Labs  Lab 09/01/20 1624  LIPASE 50   No results for input(s): AMMONIA in the last 168 hours. Coagulation profile Recent Labs  Lab 09/03/20 0538  INR 1.2    CBC: Recent Labs  Lab 09/01/20 1624 09/02/20 0525 09/03/20 0538  WBC 3.4* 2.7* 2.9*  NEUTROABS 2.0 1.2*  --   HGB 13.8 13.5 13.7  HCT 41.1 40.7 41.4  MCV 93.8 92.5 94.1  PLT 167 162 168   Cardiac Enzymes: No results for input(s): CKTOTAL, CKMB, CKMBINDEX, TROPONINI in the last 168 hours. BNP: Invalid input(s): POCBNP CBG: No results for input(s): GLUCAP in the last 168 hours. D-Dimer No results for input(s): DDIMER in the last 72 hours. Hgb A1c No results for input(s): HGBA1C in the last 72 hours. Lipid Profile No results for input(s): CHOL, HDL, LDLCALC, TRIG, CHOLHDL, LDLDIRECT in the last 72 hours. Thyroid function studies No results for input(s): TSH, T4TOTAL, T3FREE, THYROIDAB in the last 72 hours.  Invalid input(s): FREET3 Anemia work up No results for input(s): VITAMINB12, FOLATE, FERRITIN, TIBC, IRON, RETICCTPCT in the last 72 hours. Microbiology Recent Results (from the  past 240 hour(s))  SARS CORONAVIRUS 2 (TAT 6-24 HRS) Nasopharyngeal Nasopharyngeal Swab     Status: None   Collection Time: 09/01/20  9:44 PM   Specimen: Nasopharyngeal Swab  Result Value Ref Range Status   SARS Coronavirus 2 NEGATIVE NEGATIVE Final    Comment: (NOTE) SARS-CoV-2 target nucleic acids are NOT DETECTED.  The SARS-CoV-2 RNA is generally detectable in upper and lower respiratory specimens during the acute phase of infection. Negative results do not preclude SARS-CoV-2 infection, do not rule out co-infections with other pathogens, and should not be used as the sole basis for treatment or other patient management  decisions. Negative results must be combined with clinical observations, patient history, and epidemiological information. The expected result is Negative.  Fact Sheet for Patients: SugarRoll.be  Fact Sheet for Healthcare Providers: https://www.woods-mathews.com/  This test is not yet approved or cleared by the [place] FDA and  has been authorized for detection and/or diagnosis of SARS-CoV-2 by FDA under an Emergency Use Authorization (EUA). This EUA will remain  in effect (meaning this test can be used) for the duration of the COVID-19 declaration under Se ction 564(b)(1) of the Act, 21 U.S.C. section 360bbb-3(b)(1), unless the authorization is terminated or revoked sooner.  Performed at [facility], [place] [address] [place], [place] [address]      Discharge Instructions:   Discharge Instructions     Call MD for:  persistant nausea and vomiting   Complete by: As directed    Call MD for:  severe uncontrolled pain   Complete by: As directed    Call MD for:  temperature >100.4   Complete by: As directed    Diet - low sodium heart healthy   Complete by: As directed    Discharge instructions   Complete by: As directed    Follow-up with your primary care physician in 1 week.  Complete the course of antibiotic. Follow up with GI doctor at your [facility] for colonoscopy in 4-6 weeks, you might need to see colorectal surgeon as well. Discuss this with your primary /GI doctor.   Increase activity slowly   Complete by: As directed       Allergies as of 09/04/2020       Reactions   Contrast Media [iodinated Diagnostic Agents] Nausea Only   Percocet [oxycodone-acetaminophen] Other (See Comments)   Shakes, chills, can tolerate tylenol        Medication List     STOP taking these medications    ciprofloxacin 500 MG tablet Commonly known as: CIPRO   levofloxacin 500 MG tablet Commonly known as: LEVAQUIN   metroNIDAZOLE 500  MG tablet Commonly known as: Flagyl       TAKE these medications    Aflibercept 2 MG/0.05ML Soln Inject 2 mg into the eye once. Left eye   amoxicillin-clavulanate 875-125 MG tablet Commonly known as: Augmentin Take 1 tablet by mouth 2 (two) times daily for 12 days.   apixaban 5 MG Tabs tablet Commonly known as: ELIQUIS Take 5 mg by mouth 2 (two) times daily.   atenolol 25 MG tablet Commonly known as: TENORMIN Take 25 tablets by mouth daily.   Brinzolamide-Brimonidine 1-0.2 % Susp Place 1 drop into both eyes 2 (two) times daily.   cholecalciferol 1000 units tablet Commonly known as: VITAMIN D Take 1,000 Units by mouth daily.   diclofenac Sodium 1 % Gel Commonly known as: VOLTAREN Apply 4 g topically 2 (two) times daily as needed (pain).   Difluprednate  0.05 % Emul Place 1 drop into the left eye 2 (two) times daily.   docusate sodium 100 MG capsule Commonly known as: Colace Take 1 capsule (100 mg total) by mouth at bedtime.   finasteride 5 MG tablet Commonly known as: PROSCAR Take 5 mg by mouth daily.   furosemide 20 MG tablet Commonly known as: LASIX Take 10 mg by mouth daily as needed for fluid.   HYDROcodone-acetaminophen 5-325 MG tablet Commonly known as: NORCO/VICODIN Take 1-2 tablets by mouth every 6 hours as needed for pain. What changed:   how much to take  how to take this  when to take this  reasons to take this  additional instructions   ketorolac 0.5 % ophthalmic solution Commonly known as: ACULAR Place 1 drop into the left eye 3 (three) times daily.   latanoprost 0.005 % ophthalmic solution Commonly known as: XALATAN Place 1 drop into both eyes at bedtime.   lisinopril 20 MG tablet Commonly known as: ZESTRIL Take 20 mg by mouth daily.   multivitamin with minerals Tabs tablet Take 1 tablet by mouth daily.   tamsulosin 0.4 MG Caps capsule Commonly known as: FLOMAX Take 0.4 mg by mouth 2 (two) times daily.        Follow-up  Information     Go to  [facility].   Specialty: Emergency Medicine Why: If symptoms worsen Contact information: [place] [phone] [place]        [name], MD. Schedule an appointment as soon as possible for a visit in 1 week(s).   Specialty: Internal Medicine Why: for regular followup, discuss about colonoscopy with GI doc in 4-6 weeks. Contact information: [place] [address] [phone]                  Time coordinating discharge: 39 minutes  Signed:  Talor Cheema  [facility] 09/04/2020, 9:21 AM

## 2020-09-04 NOTE — Progress Notes (Addendum)
Discussed plan of care with Dr. Louanne Belton Pt tolerating soft diet. abd pain improving.  Stable for discharge home on PO augmentin to complete a total of 14 days abx Recommend follow up in the [place] [facility]. Will need a colonoscopy by GI in 6 weeks. Would recommend referral to colorectal surgeon in the [facility] system to discuss elective partial colectomy given recurrent diverticulitis and now complicated diverticulitis.    Obie Dredge, PA-C [facility] Please see Amion for pager number during day hours 7:00am-4:30pm

## 2020-09-05 ENCOUNTER — Other Ambulatory Visit: Payer: Self-pay | Admitting: Internal Medicine

## 2020-09-05 MED ORDER — AMOXICILLIN-POT CLAVULANATE 875-125 MG PO TABS: 1.0000 | ORAL_TABLET | Freq: Two times a day (BID) | ORAL | 0 refills | Status: AC

## 2020-09-05 MED ORDER — AMOXICILLIN-POT CLAVULANATE 875-125 MG PO TABS: 1.0000 | ORAL_TABLET | Freq: Two times a day (BID) | ORAL | 0 refills | Status: DC

## 2021-10-04 ENCOUNTER — Encounter (HOSPITAL_COMMUNITY): Payer: Self-pay

## 2021-10-04 ENCOUNTER — Emergency Department (HOSPITAL_COMMUNITY): Payer: No Typology Code available for payment source

## 2021-10-04 ENCOUNTER — Other Ambulatory Visit: Payer: Self-pay

## 2021-10-04 ENCOUNTER — Emergency Department (HOSPITAL_COMMUNITY)
Admission: EM | Admit: 2021-10-04 | Discharge: 2021-10-04 | Disposition: A | Discharge status: Home / Self Care | Payer: No Typology Code available for payment source | Attending: Emergency Medicine | Admitting: Emergency Medicine

## 2021-10-04 DIAGNOSIS — R0789 Other chest pain: Secondary | ICD-10-CM | POA: Insufficient documentation

## 2021-10-04 DIAGNOSIS — W01198A Fall on same level from slipping, tripping and stumbling with subsequent striking against other object, initial encounter: Secondary | ICD-10-CM | POA: Insufficient documentation

## 2021-10-04 DIAGNOSIS — M25512 Pain in left shoulder: Secondary | ICD-10-CM | POA: Insufficient documentation

## 2021-10-04 MED ORDER — TRAMADOL HCL 50 MG PO TABS
50.0000 mg | ORAL_TABLET | Freq: Once | ORAL | Status: AC
  Administered 2021-10-04: 50 mg via ORAL
  Filled 2021-10-04: qty 1

## 2021-10-04 NOTE — ED Provider Notes (Signed)
?[facility] ?Provider Note ? ? ?CSN: [id number] ?Arrival date & time: 10/04/21  0955 ? ?  ? ?History ? ?Chief Complaint  ?Patient presents with  ? Shoulder Pain  ? ? ?Chad Guerra is a 71 y.o. male.  Presents to ER due to concern for fall.  Patient states that he lost his balance and fell into the side of the door frame, struck his left shoulder.  Is having pain in his left shoulder, worse with movement improved with rest.  No difficulty in breathing.  No fever or chills.  Has been able to walk without any difficulty. ? ?HPI ? ?  ? ?Home Medications ?Prior to Admission medications   ?Medication Sig Start Date End Date Taking? Authorizing Provider  ?Aflibercept 2 MG/0.05ML SOLN Inject 2 mg into the eye once. Left eye 08/21/20   [provider]  ?apixaban (ELIQUIS) 5 MG TABS tablet Take 5 mg by mouth 2 (two) times daily. 01/23/20   [provider]  ?atenolol (TENORMIN) 25 MG tablet Take 25 tablets by mouth daily. 06/06/20   [provider]  ?Brinzolamide-Brimonidine 1-0.2 % SUSP Place 1 drop into both eyes 2 (two) times daily. 03/10/19   [provider]  ?cholecalciferol (VITAMIN D) 1000 UNITS tablet Take 1,000 Units by mouth daily.    [provider]  ?diclofenac Sodium (VOLTAREN) 1 % GEL Apply 4 g topically 2 (two) times daily as needed (pain). 08/17/20   [provider]  ?Difluprednate 0.05 % EMUL Place 1 drop into the left eye 2 (two) times daily. 07/24/20   [provider]  ?finasteride (PROSCAR) 5 MG tablet Take 5 mg by mouth daily. 06/06/20   [provider]  ?furosemide (LASIX) 20 MG tablet Take 10 mg by mouth daily as needed for fluid. 01/23/20   [provider]  ?HYDROcodone-acetaminophen (NORCO/VICODIN) 5-325 MG per tablet Take 1-2 tablets by mouth every 6 hours as needed for pain. ?Patient taking differently: Take 1 tablet by mouth every 6 (six) hours as needed for moderate pain. 03/23/14    Pisciotta, Elmyra Ricks, PA-C  ?ketorolac (ACULAR) 0.5 % ophthalmic solution Place 1 drop into the left eye 3 (three) times daily. 04/06/20   [provider]  ?latanoprost (XALATAN) 0.005 % ophthalmic solution Place 1 drop into both eyes at bedtime. ?Patient not taking: No sig reported 03/10/19   [provider]  ?lisinopril (PRINIVIL,ZESTRIL) 20 MG tablet Take 20 mg by mouth daily.    [provider]  ?Multiple Vitamin (MULTIVITAMIN WITH MINERALS) TABS Take 1 tablet by mouth daily. ?Patient not taking: Reported on 09/01/2020    [provider]  ?tamsulosin (FLOMAX) 0.4 MG CAPS capsule Take 0.4 mg by mouth 2 (two) times daily. 02/28/19   [provider]  ?   ? ?Allergies    ?Contrast media [iodinated contrast media] and Percocet [oxycodone-acetaminophen]   ? ?Review of Systems   ?Review of Systems  ?Constitutional:  Negative for chills and fever.  ?HENT:  Negative for ear pain and sore throat.   ?Eyes:  Negative for pain and visual disturbance.  ?Respiratory:  Negative for cough and shortness of breath.   ?Cardiovascular:  Negative for chest pain and palpitations.  ?Gastrointestinal:  Negative for abdominal pain and vomiting.  ?Genitourinary:  Negative for dysuria and hematuria.  ?Musculoskeletal:  Positive for arthralgias. Negative for back pain.  ?Skin:  Negative for color change and rash.  ?Neurological:  Negative for seizures and syncope.  ?All other systems  reviewed and are negative. ? ?Physical Exam ?Updated Vital Signs ?BP (!) 161/115 (BP Location: Right Arm)   Pulse 60   Temp 97.9 ?F (36.6 ?C) (Oral)   Resp 18   SpO2 99%  ?Physical Exam ?Vitals and nursing note reviewed.  ?Constitutional:   ?   General: He is not in acute distress. ?   Appearance: He is well-developed.  ?HENT:  ?   Head: Normocephalic and atraumatic.  ?Eyes:  ?   Conjunctiva/sclera: Conjunctivae normal.  ?Neck:  ?   Comments: No tenderness of the neck ?Cardiovascular:  ?   Rate and Rhythm: Normal rate  and regular rhythm.  ?   Heart sounds: No murmur heard. ?Pulmonary:  ?   Effort: Pulmonary effort is normal. No respiratory distress.  ?   Breath sounds: Normal breath sounds.  ?   Comments: There is some tenderness over the anterior and lateral left chest wall, no crepitus ?Abdominal:  ?   Palpations: Abdomen is soft.  ?   Tenderness: There is no abdominal tenderness.  ?Musculoskeletal:  ?   Cervical back: Neck supple.  ?   Comments: Left upper extremity: There is some tenderness to the shoulder, no tenderness or deformity noted throughout the rest of extremity, radial pulse intact, distal motor sensation intact  ?Skin: ?   General: Skin is warm and dry.  ?   Capillary Refill: Capillary refill takes less than 2 seconds.  ?Neurological:  ?   Mental Status: He is alert.  ?Psychiatric:     ?   Mood and Affect: Mood normal.  ? ? ?ED Results / Procedures / Treatments   ?Labs ?(all labs ordered are listed, but only abnormal results are displayed) ?Labs Reviewed - No data to display ? ?EKG ?None ? ?Radiology ?DG Ribs Unilateral W/Chest Left ? ?Result Date: 10/04/2021 ?CLINICAL DATA:  Left rib pain.  Status post fall. EXAM: LEFT RIBS AND CHEST - 3+ VIEW COMPARISON:  None available FINDINGS: A fiducial marker was placed on the left chest wall and is in the projection between the left posterior seventh and eighth ribs. No fracture or other bone lesions are seen involving the ribs. There is no evidence of pneumothorax or pleural effusion. Both lungs are clear. Heart size and mediastinal contours are within normal limits. IMPRESSION: Negative. Electronically Signed   By: Kerby Moors M.D.   On: 10/04/2021 11:42  ? ?DG Shoulder Left ? ?Result Date: 10/04/2021 ?CLINICAL DATA:  left shoulder pain EXAM: LEFT SHOULDER - 2+ VIEW COMPARISON:  None Available. FINDINGS: There is no evidence of acute fracture or dislocation. There is moderate glenohumeral and mild AC joint degenerative change. IMPRESSION: No acute fracture or  dislocation. Moderate glenohumeral and AC joint osteoarthritis. Electronically Signed   By: Maurine Simmering M.D.   On: 10/04/2021 10:52   ? ?Procedures ?Procedures  ? ? ?Medications Ordered in ED ?Medications  ?traMADol (ULTRAM) tablet 50 mg (50 mg Oral Given 10/04/21 1123)  ? ? ?ED Course/ Medical Decision Making/ A&P ?  ?                        ?Medical Decision Making ?Amount and/or Complexity of Data Reviewed ?Radiology: ordered. ? ?Risk ?Prescription drug management. ? ? ?71 year old male presented for mechanical fall.  Did not hit head, no trauma to head, no tenderness to head neck or back.  Has some tenderness to the left shoulder and the left lateral chest wall.  Planes of shoulder  and chest are negative for traumatic pathology.  I independently reviewed XR imaging.  Neurovascular intact, discharged home.  Provided Ultram for pain medicine in ER. ? ? ? ? ? ? ? ?Final Clinical Impression(s) / ED Diagnoses ?Final diagnoses:  ?Acute pain of left shoulder  ? ? ?Rx / DC Orders ?ED Discharge Orders   ? ? None  ? ?  ? ? ?  ?Lucrezia Starch, MD ?10/04/21 1313 ? ?

## 2021-10-04 NOTE — Discharge Instructions (Signed)
Take Tylenol as needed for pain.  Come back to ER if you develop difficulty breathing, worsening chest pain or other new concerning symptom. ?

## 2021-10-04 NOTE — ED Triage Notes (Signed)
Pt reports falling into a wall last night and now endorses left shoulder pain.  ?

## 2021-12-26 ENCOUNTER — Emergency Department (HOSPITAL_COMMUNITY): Payer: No Typology Code available for payment source

## 2021-12-26 ENCOUNTER — Encounter (HOSPITAL_COMMUNITY): Payer: Self-pay

## 2021-12-26 ENCOUNTER — Other Ambulatory Visit: Payer: Self-pay

## 2021-12-26 ENCOUNTER — Emergency Department (HOSPITAL_COMMUNITY)
Admission: EM | Admit: 2021-12-26 | Discharge: 2021-12-26 | Disposition: A | Discharge status: Home / Self Care | Payer: No Typology Code available for payment source | Attending: Emergency Medicine | Admitting: Emergency Medicine

## 2021-12-26 DIAGNOSIS — Z79899 Other long term (current) drug therapy: Secondary | ICD-10-CM | POA: Diagnosis not present

## 2021-12-26 DIAGNOSIS — Z7901 Long term (current) use of anticoagulants: Secondary | ICD-10-CM | POA: Diagnosis not present

## 2021-12-26 DIAGNOSIS — W010XXA Fall on same level from slipping, tripping and stumbling without subsequent striking against object, initial encounter: Secondary | ICD-10-CM | POA: Insufficient documentation

## 2021-12-26 DIAGNOSIS — I10 Essential (primary) hypertension: Secondary | ICD-10-CM | POA: Insufficient documentation

## 2021-12-26 DIAGNOSIS — S63502A Unspecified sprain of left wrist, initial encounter: Secondary | ICD-10-CM | POA: Insufficient documentation

## 2021-12-26 DIAGNOSIS — S6992XA Unspecified injury of left wrist, hand and finger(s), initial encounter: Secondary | ICD-10-CM | POA: Diagnosis present

## 2021-12-26 NOTE — Discharge Instructions (Addendum)
You were seen today for left wrist pain.  Your imaging was reassuring for no signs of a fracture at this time.  This is likely a sprain.  It will take some time to heal.  I recommend continuing to use ice as needed over the affected area.  You may use ibuprofen for help with pain and inflammation.  I provided follow-up information for orthopedics.  Please call and schedule follow-up as needed

## 2021-12-26 NOTE — ED Provider Notes (Signed)
[facility] DEPT Provider Note   CSN: [id number] Arrival date & time: 12/26/21  0740     History  Chief Complaint  Patient presents with   Wrist Pain    Chad Guerra is a 71 y.o. male.  Patient presents with left wrist pain after having a fall yesterday.  Patient states that he fell after tripping over a ball in the driveway and landed with his hands outstretched.  He states that he has significant pain in the left wrist just proximal to the metacarpals.  He states the pain is generalized in the area and is unable to pinpoint the pain.  He wrapped his hand with an Ace bandage last night which she said provided minimal relief.  He currently takes hydrocodone and muscle relaxers prescribed at the [facility] for chronic pain.  Past medical history significant for proximal A-fib, hypertension, chronic pain  HPI     Home Medications Prior to Admission medications   Medication Sig Start Date End Date Taking? Authorizing Provider  Aflibercept 2 MG/0.05ML SOLN Inject 2 mg into the eye once. Left eye 08/21/20   [provider]  apixaban (ELIQUIS) 5 MG TABS tablet Take 5 mg by mouth 2 (two) times daily. 01/23/20   [provider]  atenolol (TENORMIN) 25 MG tablet Take 25 tablets by mouth daily. 06/06/20   [provider]  Brinzolamide-Brimonidine 1-0.2 % SUSP Place 1 drop into both eyes 2 (two) times daily. 03/10/19   [provider]  cholecalciferol (VITAMIN D) 1000 UNITS tablet Take 1,000 Units by mouth daily.    [provider]  diclofenac Sodium (VOLTAREN) 1 % GEL Apply 4 g topically 2 (two) times daily as needed (pain). 08/17/20   [provider]  Difluprednate 0.05 % EMUL Place 1 drop into the left eye 2 (two) times daily. 07/24/20   [provider]  finasteride (PROSCAR) 5 MG tablet Take 5 mg by mouth daily. 06/06/20   [provider]  furosemide (LASIX) 20 MG tablet Take 10 mg by mouth daily as  needed for fluid. 01/23/20   [provider]  HYDROcodone-acetaminophen (NORCO/VICODIN) 5-325 MG per tablet Take 1-2 tablets by mouth every 6 hours as needed for pain. Patient taking differently: Take 1 tablet by mouth every 6 (six) hours as needed for moderate pain. 03/23/14   Pisciotta, Elmyra Ricks, PA-C  ketorolac (ACULAR) 0.5 % ophthalmic solution Place 1 drop into the left eye 3 (three) times daily. 04/06/20   [provider]  latanoprost (XALATAN) 0.005 % ophthalmic solution Place 1 drop into both eyes at bedtime. Patient not taking: No sig reported 03/10/19   [provider]  lisinopril (PRINIVIL,ZESTRIL) 20 MG tablet Take 20 mg by mouth daily.    [provider]  Multiple Vitamin (MULTIVITAMIN WITH MINERALS) TABS Take 1 tablet by mouth daily. Patient not taking: Reported on 09/01/2020    [provider]  tamsulosin (FLOMAX) 0.4 MG CAPS capsule Take 0.4 mg by mouth 2 (two) times daily. 02/28/19   [provider]      Allergies    Contrast media [iodinated contrast media] and Percocet [oxycodone-acetaminophen]    Review of Systems   Review of Systems  Musculoskeletal:  Positive for arthralgias. Negative for joint swelling.    Physical Exam Updated Vital Signs BP 133/89   Pulse 65   Temp 98.2 F (36.8 C)   Resp 18   Ht 5' 10.5" (1.791 m)   Wt 103.9 kg   SpO2  95%   BMI 32.39 kg/m  Physical Exam Vitals and nursing note reviewed.  Constitutional:      General: He is not in acute distress. HENT:     Head: Normocephalic and atraumatic.  Eyes:     Conjunctiva/sclera: Conjunctivae normal.  Cardiovascular:     Rate and Rhythm: Normal rate.     Pulses: Normal pulses.  Pulmonary:     Effort: Pulmonary effort is normal.  Musculoskeletal:        General: Tenderness (Mild generalized tenderness to palpation around the left wrist.  No snuffbox tenderness noted) present. No swelling. Normal range of motion.     Cervical back: Normal  range of motion and neck supple.  Skin:    General: Skin is warm and dry.  Neurological:     Mental Status: He is alert and oriented to person, place, and time.     ED Results / Procedures / Treatments   Labs (all labs ordered are listed, but only abnormal results are displayed) Labs Reviewed - No data to display  EKG None  Radiology DG Wrist Complete Left  Result Date: 12/26/2021 CLINICAL DATA:  LEFT wrist pain post fall yesterday EXAM: LEFT WRIST - COMPLETE 3+ VIEW COMPARISON:  None Available. FINDINGS: Osseous mineralization normal. Joint spaces preserved. No acute fracture, dislocation, or bone destruction. IMPRESSION: No acute osseous abnormalities. Electronically Signed   By: Lavonia Dana M.D.   On: 12/26/2021 08:43    Procedures Procedures    Medications Ordered in ED Medications - No data to display  ED Course/ Medical Decision Making/ A&P                           Medical Decision Making Amount and/or Complexity of Data Reviewed Radiology: ordered.   The patient presents with a chief complaint of left-sided wrist pain.  Differential includes but is not limited to fracture, dislocation, sprain, soft tissue injury, and others  I ordered and interpreted imaging including plain films of the left wrist. No fracture or dislocation noted.  I agree with the radiologist findings  I had the patient placed in a wrist brace for support.  There are no signs of fracture or dislocation on imaging at this time.  This is likely a wrist sprain.  There is no snuffbox tenderness to suggest an early scaphoid fracture.  He may use the wrist brace as needed for support.  I will provide him contact information for follow-up with hand as needed.  He may use ibuprofen for pain.  I recommended icing the wrist throughout the day to help with inflammation.  Discharge home       Final Clinical Impression(s) / ED Diagnoses Final diagnoses:  Sprain of left wrist, initial encounter    Rx  / DC Orders ED Discharge Orders     None         Ronny Bacon 12/26/21 [place], [place], DO 12/29/21 [id number]

## 2021-12-26 NOTE — ED Triage Notes (Signed)
Patient c/o left wrist pain and swelling since yesterday. Patient denies any injury.

## 2022-05-03 ENCOUNTER — Encounter (HOSPITAL_COMMUNITY): Payer: Self-pay

## 2022-05-03 ENCOUNTER — Emergency Department (HOSPITAL_COMMUNITY): Payer: No Typology Code available for payment source

## 2022-05-03 ENCOUNTER — Emergency Department (HOSPITAL_COMMUNITY)
Admission: EM | Admit: 2022-05-03 | Discharge: 2022-05-03 | Disposition: A | Discharge status: Home / Self Care | Payer: No Typology Code available for payment source | Attending: Emergency Medicine | Admitting: Emergency Medicine

## 2022-05-03 DIAGNOSIS — Z7901 Long term (current) use of anticoagulants: Secondary | ICD-10-CM | POA: Diagnosis not present

## 2022-05-03 DIAGNOSIS — M25551 Pain in right hip: Secondary | ICD-10-CM | POA: Diagnosis not present

## 2022-05-03 DIAGNOSIS — R109 Unspecified abdominal pain: Secondary | ICD-10-CM | POA: Diagnosis present

## 2022-05-03 DIAGNOSIS — I10 Essential (primary) hypertension: Secondary | ICD-10-CM | POA: Diagnosis not present

## 2022-05-03 DIAGNOSIS — Z79899 Other long term (current) drug therapy: Secondary | ICD-10-CM | POA: Diagnosis not present

## 2022-05-03 LAB — CBC WITH DIFFERENTIAL/PLATELET
Abs Immature Granulocytes: 0.01 10*3/uL (ref 0.00–0.07)
Basophils Absolute: 0 10*3/uL (ref 0.0–0.1)
Basophils Relative: 1 %
Eosinophils Absolute: 0.1 10*3/uL (ref 0.0–0.5)
Eosinophils Relative: 2 %
HCT: 46.4 % (ref 39.0–52.0)
Hemoglobin: 15 g/dL (ref 13.0–17.0)
Immature Granulocytes: 0 %
Lymphocytes Relative: 30 %
Lymphs Abs: 0.9 10*3/uL (ref 0.7–4.0)
MCH: 30.7 pg (ref 26.0–34.0)
MCHC: 32.3 g/dL (ref 30.0–36.0)
MCV: 94.9 fL (ref 80.0–100.0)
Monocytes Absolute: 0.3 10*3/uL (ref 0.1–1.0)
Monocytes Relative: 9 %
Neutro Abs: 1.9 10*3/uL (ref 1.7–7.7)
Neutrophils Relative %: 58 %
Platelets: 202 10*3/uL (ref 150–400)
RBC: 4.89 MIL/uL (ref 4.22–5.81)
RDW: 12.2 % (ref 11.5–15.5)
WBC: 3.2 10*3/uL — ABNORMAL LOW (ref 4.0–10.5)
nRBC: 0 % (ref 0.0–0.2)

## 2022-05-03 LAB — COMPREHENSIVE METABOLIC PANEL
ALT: 24 U/L (ref 0–44)
AST: 20 U/L (ref 15–41)
Albumin: 3.7 g/dL (ref 3.5–5.0)
Alkaline Phosphatase: 82 U/L (ref 38–126)
Anion gap: 7 (ref 5–15)
BUN: 11 mg/dL (ref 8–23)
CO2: 25 mmol/L (ref 22–32)
Calcium: 8.5 mg/dL — ABNORMAL LOW (ref 8.9–10.3)
Chloride: 106 mmol/L (ref 98–111)
Creatinine, Ser: 1.05 mg/dL (ref 0.61–1.24)
GFR, Estimated: >60 mL/min (ref >60)
Glucose, Bld: 102 mg/dL — ABNORMAL HIGH (ref 70–99)
Potassium: 3.9 mmol/L (ref 3.5–5.1)
Sodium: 138 mmol/L (ref 135–145)
Total Bilirubin: 1.1 mg/dL (ref 0.3–1.2)
Total Protein: 7.1 g/dL (ref 6.5–8.1)

## 2022-05-03 LAB — URINALYSIS, ROUTINE W REFLEX MICROSCOPIC
Bilirubin Urine: NEGATIVE
Glucose, UA: NEGATIVE mg/dL
Hgb urine dipstick: NEGATIVE
Ketones, ur: NEGATIVE mg/dL
Leukocytes,Ua: NEGATIVE
Nitrite: NEGATIVE
Protein, ur: NEGATIVE mg/dL
Specific Gravity, Urine: 1.011 (ref 1.005–1.030)
pH: 6 (ref 5.0–8.0)

## 2022-05-03 MED ORDER — NAPROXEN 375 MG PO TABS
375.0000 mg | ORAL_TABLET | Freq: Once | ORAL | Status: AC
  Administered 2022-05-03: 375 mg via ORAL
  Filled 2022-05-03: qty 1

## 2022-05-03 MED ORDER — ACETAMINOPHEN 500 MG PO TABS: 500.0000 mg | ORAL_TABLET | Freq: Four times a day (QID) | ORAL | 0 refills | Status: DC | PRN

## 2022-05-03 MED ORDER — LIDOCAINE 4 % EX PTCH: 1.0000 | MEDICATED_PATCH | Freq: Two times a day (BID) | CUTANEOUS | 0 refills | Status: DC

## 2022-05-03 MED ORDER — TIZANIDINE HCL 4 MG PO TABS
4.0000 mg | ORAL_TABLET | Freq: Once | ORAL | Status: AC
Start: 2022-05-03 — End: 2022-05-03
  Administered 2022-05-03: 4 mg via ORAL
  Filled 2022-05-03: qty 1

## 2022-05-03 MED ORDER — HYDROCODONE-ACETAMINOPHEN 5-325 MG PO TABS
1.0000 | ORAL_TABLET | Freq: Once | ORAL | Status: AC
  Administered 2022-05-03: 1 via ORAL
  Filled 2022-05-03: qty 1

## 2022-05-03 NOTE — ED Provider Triage Note (Signed)
Emergency Medicine Provider Triage Evaluation Note  Chad Guerra , a 71 y.o. male  was evaluated in triage.  Pt complains of right flank pain that is been ongoing for the past 2 days, has taken gabapentin, muscle relaxers without any improvement in symptoms.  He does not have any urinary symptoms.  He does have prior history of kidney stones with similar presentation.  Review of Systems  Positive: Right flank pain Negative: Fever, urinary symptoms  Physical Exam  BP (!) 183/109 (BP Location: Left Arm) Comment: Did not take blood pressure medication today  Pulse 65   Temp 97.6 F (36.4 C) (Oral)   Resp 18   SpO2 100%  Gen:   Awake, no distress   Resp:  Normal effort  MSK:   Moves extremities without difficulty  Other:  RIGHT CVA tenderness  Medical Decision Making  Medically screening exam initiated at 11:16 AM.  Appropriate orders placed.  Chad Guerra was informed that the remainder of the evaluation will be completed by another provider, this initial triage assessment does not replace that evaluation, and the importance of remaining in the ED until their evaluation is complete.     Janeece Fitting, PA-C 05/03/22 1119

## 2022-05-03 NOTE — Discharge Instructions (Addendum)
The CT scan of your abdomen and also the x-ray of the hip are not revealing any evidence of kidney stone or bony abnormality.  No clear evidence of severe arthritis.  Our suspicion is that your pain is musculoskeletal in nature. Take the medications prescribed.  We recommend that you massage the area of discomfort, perform stretching exercises and also apply warm compresses at the site of pain.  Please see your primary care doctor if the symptoms continue, you might need advanced imaging or physical therapy.

## 2022-05-03 NOTE — ED Triage Notes (Signed)
Pt presents with c/o right flank pain for several days. Pt reports hx of kidney stones in the past, denies hematuria at this time.

## 2022-05-03 NOTE — ED Provider Notes (Signed)
[facility] DEPT Provider Note   CSN: [id number] Arrival date & time: 05/03/22  1044     History  Chief Complaint  Patient presents with   Flank Pain    Chad Guerra is a 71 y.o. male.  HPI    71 year old male comes in with chief complaint of right flank pain. Patient has been having flank pain for the past 2 days.  He has history of kidney stones in the remote past, but states that this pain appears different.  Patient denies any burning with urination, blood in the urine.  He denies any nausea, vomiting, fevers, chills, trauma.  Pain is constant and worse with any kind of activity or movement.  Patient has history of paroxysmal A-fib for which he is on Eliquis.  He also has history of hydrocele, hypertension  Home Medications Prior to Admission medications   Medication Sig Start Date End Date Taking? Authorizing Provider  acetaminophen (TYLENOL) 500 MG tablet Take 1 tablet (500 mg total) by mouth every 6 (six) hours as needed. 05/03/22  Yes Kavya Haag, MD  lidocaine 4 % Place 1 patch onto the skin 2 (two) times daily. 05/03/22  Yes Gilma Bessette, MD  Aflibercept 2 MG/0.05ML SOLN Inject 2 mg into the eye once. Left eye 08/21/20   [provider]  apixaban (ELIQUIS) 5 MG TABS tablet Take 5 mg by mouth 2 (two) times daily. 01/23/20   [provider]  atenolol (TENORMIN) 25 MG tablet Take 25 tablets by mouth daily. 06/06/20   [provider]  Brinzolamide-Brimonidine 1-0.2 % SUSP Place 1 drop into both eyes 2 (two) times daily. 03/10/19   [provider]  cholecalciferol (VITAMIN D) 1000 UNITS tablet Take 1,000 Units by mouth daily.    [provider]  diclofenac Sodium (VOLTAREN) 1 % GEL Apply 4 g topically 2 (two) times daily as needed (pain). 08/17/20   [provider]  Difluprednate 0.05 % EMUL Place 1 drop into the left eye 2 (two) times daily. 07/24/20   [provider]   finasteride (PROSCAR) 5 MG tablet Take 5 mg by mouth daily. 06/06/20   [provider]  furosemide (LASIX) 20 MG tablet Take 10 mg by mouth daily as needed for fluid. 01/23/20   [provider]  HYDROcodone-acetaminophen (NORCO/VICODIN) 5-325 MG per tablet Take 1-2 tablets by mouth every 6 hours as needed for pain. Patient taking differently: Take 1 tablet by mouth every 6 (six) hours as needed for moderate pain. 03/23/14   Pisciotta, Elmyra Ricks, PA-C  ketorolac (ACULAR) 0.5 % ophthalmic solution Place 1 drop into the left eye 3 (three) times daily. 04/06/20   [provider]  latanoprost (XALATAN) 0.005 % ophthalmic solution Place 1 drop into both eyes at bedtime. Patient not taking: No sig reported 03/10/19   [provider]  lisinopril (PRINIVIL,ZESTRIL) 20 MG tablet Take 20 mg by mouth daily.    [provider]  Multiple Vitamin (MULTIVITAMIN WITH MINERALS) TABS Take 1 tablet by mouth daily. Patient not taking: Reported on 09/01/2020    [provider]  tamsulosin (FLOMAX) 0.4 MG CAPS capsule Take 0.4 mg by mouth 2 (two) times daily. 02/28/19   [provider]      Allergies    Contrast media [iodinated contrast media] and Percocet [oxycodone-acetaminophen]    Review of Systems   Review of Systems  All other systems reviewed and are negative.   Physical Exam Updated Vital Signs BP (!) 157/101  Pulse 64   Temp 98 F (36.7 C)   Resp 20   SpO2 100%  Physical Exam Vitals and nursing note reviewed.  Constitutional:      Appearance: He is well-developed.  HENT:     Head: Atraumatic.  Cardiovascular:     Rate and Rhythm: Normal rate.  Pulmonary:     Effort: Pulmonary effort is normal.  Musculoskeletal:     Cervical back: Neck supple.     Comments: Patient has reproducible tenderness with palpation of the right sacroiliac region and also the right posterior hip.  Patient has also tenderness with active leg raise.  He has  no focal midline tenderness over the right lumbar spine.  Skin:    General: Skin is warm.  Neurological:     Mental Status: He is alert and oriented to person, place, and time.     ED Results / Procedures / Treatments   Labs (all labs ordered are listed, but only abnormal results are displayed) Labs Reviewed  CBC WITH DIFFERENTIAL/PLATELET - Abnormal; Notable for the following components:      Result Value   WBC 3.2 (*)    All other components within normal limits  COMPREHENSIVE METABOLIC PANEL - Abnormal; Notable for the following components:   Glucose, Bld 102 (*)    Calcium 8.5 (*)    All other components within normal limits  URINALYSIS, ROUTINE W REFLEX MICROSCOPIC    EKG None  Radiology DG Hip Unilat W or Wo Pelvis 2-3 Views Right  Result Date: 05/03/2022 CLINICAL DATA:  Assess osteoarthritis versus lytic lesion. Right hip pain. No injury. EXAM: DG HIP (WITH OR WITHOUT PELVIS) 2-3V RIGHT COMPARISON:  CT abdomen pelvis, 05/03/2022. FINDINGS: No fracture. No bone lesion. On review of the current abdomen and pelvis CT, there is a small focus of subchondral cystic change along the anterior superior base of the right femoral head, which appears degenerative. Hip joints, SI joints and symphysis pubis are normally spaced and aligned. No significant degenerative/arthropathic changes. Soft tissues are unremarkable. IMPRESSION: Negative. Electronically Signed   By: Lajean Manes M.D.   On: 05/03/2022 15:27   CT Renal Stone Study  Result Date: 05/03/2022 CLINICAL DATA:  Abdomen/flank pain. Clinical suspicion for ureteral stone. EXAM: CT ABDOMEN AND PELVIS WITHOUT CONTRAST TECHNIQUE: Multidetector CT imaging of the abdomen and pelvis was performed following the standard protocol without IV contrast. RADIATION DOSE REDUCTION: This exam was performed according to the departmental dose-optimization program which includes automated exposure control, adjustment of the mA and/or kV according to  patient size and/or use of iterative reconstruction technique. COMPARISON:  09/01/2020. FINDINGS: Lower chest: Clear lung bases. Hepatobiliary: No focal liver abnormality is seen. Status post cholecystectomy. No biliary dilatation. Pancreas: Unremarkable. No pancreatic ductal dilatation or surrounding inflammatory changes. Spleen: Normal in size without focal abnormality. Adrenals/Urinary Tract: Normal adrenal glands. Kidneys normal in size, orientation and position. 2.6 cm low-attenuation mass, lower pole the left kidney, increased in size from the prior CT where it measured 1.3 cm, still consistent with a cyst. No other renal masses, no stones and no hydronephrosis. Mild chronic bilateral perinephric stranding. Normal ureters. Normal bladder. Stomach/Bowel: Normal stomach. Small bowel and colon are normal in caliber. No wall thickening. No inflammation. Numerous colonic diverticula, mostly on the left. No diverticulitis. Normal appendix visualized. Vascular/Lymphatic: Aortic atherosclerotic calcifications. No aneurysm. 1.6 cm low-attenuation oval lesion adjacent to the right external iliac vessels, unchanged, likely a prominent lymph node. No other evidence of enlarged lymph nodes. Reproductive:  Enlarged prostate measuring 4.8 x 4.1 cm transversely. Other: No abdominal wall hernia or abnormality. No abdominopelvic ascites. Musculoskeletal: No fracture or acute finding.  No bone lesion. IMPRESSION: 1. No acute findings within the abdomen or pelvis. No renal or ureteral stones. No obstructive uropathy. 2. Aortic atherosclerosis. 3. Colonic diverticulosis. Electronically Signed   By: Lajean Manes M.D.   On: 05/03/2022 12:16    Procedures Procedures    Medications Ordered in ED Medications  HYDROcodone-acetaminophen (NORCO/VICODIN) 5-325 MG per tablet 1 tablet (1 tablet Oral Given 05/03/22 1525)  tiZANidine (ZANAFLEX) tablet 4 mg (4 mg Oral Given 05/03/22 1525)  naproxen (NAPROSYN) tablet 375 mg (375 mg Oral  Given 05/03/22 1525)    ED Course/ Medical Decision Making/ A&P                           Medical Decision Making Amount and/or Complexity of Data Reviewed Radiology: ordered.  Risk OTC drugs. Prescription drug management.   This patient presents to the ED with chief complaint(s) of right flank pain with pertinent past medical history of paroxysmal A-fib on Eliquis.The complaint involves an extensive differential diagnosis and also carries with it a high risk of complications and morbidity.    On exam, patient has reproducible tenderness with palpation of the right posterior hip and SI region.  He also has tenderness with active leg raise.  Abdominal exam is reassuring.  Given history of kidney stone, in the triage CT renal stone was ordered.  That study is negative.  Patient's labs including CBC, urine analysis reviewed independently, there is no evidence of elevated white count, renal failure, hematuria.  CT scan independently interpreted.  There is no evidence of significant spine disease based on my interpretation.  I will add x-ray of the hip to see if there is any osteoarthritis.  Differential diagnosis primarily now includes musculoskeletal pain of the right hip, bursitis of the right hip, osteoarthritis of the hip, hematoma.  If the workup is negative, then we will advise stretching exercises, warm compresses, deep tissue massage along with supportive medications and follow-up with PCP to see if physical therapy or orthopedic surgery consultation is needed.  Patient is able to ambulate.  Final Clinical Impression(s) / ED Diagnoses Final diagnoses:  Right hip pain    Rx / DC Orders ED Discharge Orders          Ordered    acetaminophen (TYLENOL) 500 MG tablet  Every 6 hours PRN        05/03/22 1548    lidocaine 4 %  2 times daily        05/03/22 1548              Varney Biles, MD 05/03/22 1633

## 2023-01-26 ENCOUNTER — Other Ambulatory Visit: Payer: Self-pay

## 2023-01-26 ENCOUNTER — Emergency Department (HOSPITAL_COMMUNITY): Payer: No Typology Code available for payment source

## 2023-01-26 ENCOUNTER — Emergency Department (HOSPITAL_COMMUNITY)
Admission: EM | Admit: 2023-01-26 | Discharge: 2023-01-26 | Disposition: A | Discharge status: Home / Self Care | Payer: No Typology Code available for payment source

## 2023-01-26 ENCOUNTER — Encounter (HOSPITAL_COMMUNITY): Payer: Self-pay

## 2023-01-26 DIAGNOSIS — Z7901 Long term (current) use of anticoagulants: Secondary | ICD-10-CM | POA: Insufficient documentation

## 2023-01-26 DIAGNOSIS — Z79899 Other long term (current) drug therapy: Secondary | ICD-10-CM | POA: Insufficient documentation

## 2023-01-26 DIAGNOSIS — I10 Essential (primary) hypertension: Secondary | ICD-10-CM | POA: Diagnosis not present

## 2023-01-26 DIAGNOSIS — K5732 Diverticulitis of large intestine without perforation or abscess without bleeding: Secondary | ICD-10-CM | POA: Insufficient documentation

## 2023-01-26 DIAGNOSIS — K5792 Diverticulitis of intestine, part unspecified, without perforation or abscess without bleeding: Secondary | ICD-10-CM

## 2023-01-26 DIAGNOSIS — R1030 Lower abdominal pain, unspecified: Secondary | ICD-10-CM | POA: Diagnosis present

## 2023-01-26 LAB — COMPREHENSIVE METABOLIC PANEL
ALT: 25 U/L (ref 0–44)
AST: 23 U/L (ref 15–41)
Albumin: 3.7 g/dL (ref 3.5–5.0)
Alkaline Phosphatase: 79 U/L (ref 38–126)
Anion gap: 7 (ref 5–15)
BUN: 14 mg/dL (ref 8–23)
CO2: 26 mmol/L (ref 22–32)
Calcium: 8.4 mg/dL — ABNORMAL LOW (ref 8.9–10.3)
Chloride: 104 mmol/L (ref 98–111)
Creatinine, Ser: 1.03 mg/dL (ref 0.61–1.24)
GFR, Estimated: >60 mL/min (ref >60)
Glucose, Bld: 101 mg/dL — ABNORMAL HIGH (ref 70–99)
Potassium: 3.8 mmol/L (ref 3.5–5.1)
Sodium: 137 mmol/L (ref 135–145)
Total Bilirubin: 0.7 mg/dL (ref 0.3–1.2)
Total Protein: 7.3 g/dL (ref 6.5–8.1)

## 2023-01-26 LAB — LIPASE, BLOOD: Lipase: 60 U/L — ABNORMAL HIGH (ref 11–51)

## 2023-01-26 LAB — URINALYSIS, ROUTINE W REFLEX MICROSCOPIC
Bilirubin Urine: NEGATIVE
Glucose, UA: NEGATIVE mg/dL
Hgb urine dipstick: NEGATIVE
Ketones, ur: NEGATIVE mg/dL
Leukocytes,Ua: NEGATIVE
Nitrite: NEGATIVE
Protein, ur: NEGATIVE mg/dL
Specific Gravity, Urine: 1.013 (ref 1.005–1.030)
pH: 5 (ref 5.0–8.0)

## 2023-01-26 LAB — CBC
HCT: 43.1 % (ref 39.0–52.0)
Hemoglobin: 14 g/dL (ref 13.0–17.0)
MCH: 30.6 pg (ref 26.0–34.0)
MCHC: 32.5 g/dL (ref 30.0–36.0)
MCV: 94.1 fL (ref 80.0–100.0)
Platelets: 180 10*3/uL (ref 150–400)
RBC: 4.58 MIL/uL (ref 4.22–5.81)
RDW: 11.9 % (ref 11.5–15.5)
WBC: 3.1 10*3/uL — ABNORMAL LOW (ref 4.0–10.5)
nRBC: 0 % (ref 0.0–0.2)

## 2023-01-26 MED ORDER — AMOXICILLIN-POT CLAVULANATE 875-125 MG PO TABS
1.0000 | ORAL_TABLET | Freq: Once | ORAL | Status: AC
  Administered 2023-01-26: 1 via ORAL
  Filled 2023-01-26: qty 1

## 2023-01-26 MED ORDER — ONDANSETRON HCL 4 MG/2ML IJ SOLN
4.0000 mg | Freq: Once | INTRAMUSCULAR | Status: AC
  Administered 2023-01-26: 4 mg via INTRAVENOUS
  Filled 2023-01-26: qty 2

## 2023-01-26 MED ORDER — AMOXICILLIN-POT CLAVULANATE 875-125 MG PO TABS: 1.0000 | ORAL_TABLET | Freq: Two times a day (BID) | ORAL | 0 refills | Status: DC

## 2023-01-26 MED ORDER — MORPHINE SULFATE (PF) 4 MG/ML IV SOLN
4.0000 mg | Freq: Once | INTRAVENOUS | Status: AC
  Administered 2023-01-26: 4 mg via INTRAVENOUS
  Filled 2023-01-26: qty 1

## 2023-01-26 NOTE — ED Triage Notes (Signed)
Patient stated he has diverticulitis. Has lower abdominal pain under his belly button for 3 days. Nauseous.

## 2023-01-26 NOTE — ED Provider Notes (Signed)
[place] EMERGENCY DEPARTMENT AT [facility] Provider Note   CSN: [id number] Arrival date & time: 01/26/23  1339     History  Chief Complaint  Patient presents with   Abdominal Pain    Chad Guerra is a 72 y.o. male.  20 male with past medical history of diverticulitis, atrial fibrillation, hypertension, and hernia repair in the past presents emergency department today with lower abdominal pain.  The patient states has been going on for the past 3 days.  Reports that is a crampy pain.  He reports that he has have had some abdominal distention but this.  He reports normal bowel movements for this.  Denies any blood in his stool or dark stools.  He has had some nausea but denies any vomiting.  He denies any fevers.  He reports that this is getting worse so he came to the ER today for further evaluation regarding this.  Patient is on Eliquis.   Abdominal Pain Associated symptoms: nausea        Home Medications Prior to Admission medications   Medication Sig Start Date End Date Taking? Authorizing Provider  amoxicillin-clavulanate (AUGMENTIN) 875-125 MG tablet Take 1 tablet by mouth every 12 (twelve) hours. 01/26/23  Yes Durwin Glaze, MD  acetaminophen (TYLENOL) 500 MG tablet Take 1 tablet (500 mg total) by mouth every 6 (six) hours as needed. 05/03/22   Derwood Kaplan, MD  Aflibercept 2 MG/0.05ML SOLN Inject 2 mg into the eye once. Left eye 08/21/20   [provider]  apixaban (ELIQUIS) 5 MG TABS tablet Take 5 mg by mouth 2 (two) times daily. 01/23/20   [provider]  atenolol (TENORMIN) 25 MG tablet Take 25 tablets by mouth daily. 06/06/20   [provider]  Brinzolamide-Brimonidine 1-0.2 % SUSP Place 1 drop into both eyes 2 (two) times daily. 03/10/19   [provider]  cholecalciferol (VITAMIN D) 1000 UNITS tablet Take 1,000 Units by mouth daily.    [provider]  diclofenac Sodium (VOLTAREN) 1 % GEL Apply 4 g  topically 2 (two) times daily as needed (pain). 08/17/20   [provider]  Difluprednate 0.05 % EMUL Place 1 drop into the left eye 2 (two) times daily. 07/24/20   [provider]  finasteride (PROSCAR) 5 MG tablet Take 5 mg by mouth daily. 06/06/20   [provider]  furosemide (LASIX) 20 MG tablet Take 10 mg by mouth daily as needed for fluid. 01/23/20   [provider]  HYDROcodone-acetaminophen (NORCO/VICODIN) 5-325 MG per tablet Take 1-2 tablets by mouth every 6 hours as needed for pain. Patient taking differently: Take 1 tablet by mouth every 6 (six) hours as needed for moderate pain. 03/23/14   Pisciotta, Joni Reining, PA-C  ketorolac (ACULAR) 0.5 % ophthalmic solution Place 1 drop into the left eye 3 (three) times daily. 04/06/20   [provider]  latanoprost (XALATAN) 0.005 % ophthalmic solution Place 1 drop into both eyes at bedtime. Patient not taking: No sig reported 03/10/19   [provider]  lidocaine 4 % Place 1 patch onto the skin 2 (two) times daily. 05/03/22   Derwood Kaplan, MD  lisinopril (PRINIVIL,ZESTRIL) 20 MG tablet Take 20 mg by mouth daily.    [provider]  Multiple Vitamin (MULTIVITAMIN WITH MINERALS) TABS Take 1 tablet by mouth daily. Patient not taking: Reported on 09/01/2020    [provider]  tamsulosin (FLOMAX) 0.4 MG CAPS capsule Take 0.4 mg by mouth 2 (  two) times daily. 02/28/19   [provider]      Allergies    Contrast media [iodinated contrast media] and Percocet [oxycodone-acetaminophen]    Review of Systems   Review of Systems  Gastrointestinal:  Positive for abdominal pain and nausea.  All other systems reviewed and are negative.   Physical Exam Updated Vital Signs BP (!) 173/73 (BP Location: Right Arm)   Pulse (!) 54   Temp 97.7 F (36.5 C) (Oral)   Resp 18   Ht 5' 10.5" (1.791 m)   Wt 102.1 kg   SpO2 100%   BMI 31.83 kg/m  Physical Exam Vitals and nursing note  reviewed.   Gen: NAD Eyes: PERRL, EOMI HEENT: no oropharyngeal swelling Neck: trachea midline Resp: clear to auscultation bilaterally Card: RRR, no murmurs, rubs, or gallops Abd: Moderately distended, the patient is tender over the lower abdomen with guarding to deep palpation Extremities: no calf tenderness, no edema Vascular: 2+ radial pulses bilaterally, 2+ DP pulses bilaterally Skin: no rashes Psyc: acting appropriately   ED Results / Procedures / Treatments   Labs (all labs ordered are listed, but only abnormal results are displayed) Labs Reviewed  LIPASE, BLOOD - Abnormal; Notable for the following components:      Result Value   Lipase 60 (*)    All other components within normal limits  COMPREHENSIVE METABOLIC PANEL - Abnormal; Notable for the following components:   Glucose, Bld 101 (*)    Calcium 8.4 (*)    All other components within normal limits  CBC - Abnormal; Notable for the following components:   WBC 3.1 (*)    All other components within normal limits  URINALYSIS, ROUTINE W REFLEX MICROSCOPIC    EKG None  Radiology CT ABDOMEN PELVIS WO CONTRAST  Result Date: 01/26/2023 CLINICAL DATA:  Lower abdominal pain EXAM: CT ABDOMEN AND PELVIS WITHOUT CONTRAST TECHNIQUE: Multidetector CT imaging of the abdomen and pelvis was performed following the standard protocol without IV contrast. RADIATION DOSE REDUCTION: This exam was performed according to the departmental dose-optimization program which includes automated exposure control, adjustment of the mA and/or kV according to patient size and/or use of iterative reconstruction technique. COMPARISON:  05/03/2022 FINDINGS: Lower chest: No acute findings Hepatobiliary: No focal liver abnormality is seen. Status post cholecystectomy. No biliary dilatation. Pancreas: No focal abnormality or ductal dilatation. Spleen: No focal abnormality.  Normal size. Adrenals/Urinary Tract: No adrenal abnormality. No suspicious focal renal  abnormality. 2.6 cm low-density lesion off the lower pole of the left kidney most compatible with cyst, stable. No follow-up imaging recommended. No stones or hydronephrosis. Urinary bladder is unremarkable. Stomach/Bowel: Left colonic diverticulosis, most pronounced in the sigmoid colon. Scattered right colonic diverticula. Mild inflammatory stranding around the mid sigmoid colon compatible with active diverticulitis. No complicating feature. Stomach and small bowel decompressed, unremarkable. Normal appendix. Vascular/Lymphatic: No evidence of aneurysm or adenopathy. Scattered aortic atherosclerosis. Reproductive: Mildly prominent prostate Other: No free fluid or free air. Musculoskeletal: No acute bony abnormality IMPRESSION: Colonic diverticulosis. Slight inflammatory stranding around the mid sigmoid colon compatible with active diverticulitis. No complicating feature. Aortic atherosclerosis. Electronically Signed   By: Charlett Nose M.D.   On: 01/26/2023 19:07    Procedures Procedures    Medications Ordered in ED Medications  morphine (PF) 4 MG/ML injection 4 mg (4 mg Intravenous Given 01/26/23 1805)  ondansetron (ZOFRAN) injection 4 mg (4 mg Intravenous Given 01/26/23 1805)  amoxicillin-clavulanate (AUGMENTIN) 875-125 MG per tablet 1 tablet (1 tablet Oral Given  01/26/23 2024)    ED Course/ Medical Decision Making/ A&P                                 Medical Decision Making 72 year old male with past medical history of diverticulitis, atrial fibrillation, hypertension, and hernia repair in the past presenting to the emergency department today with abdominal pain.  I will further evaluate the patient here with basic labs including LFTs and a lipase to evaluate for hepatobiliary pathology or pancreatitis.  I will obtain a CT scan of his abdomen to evaluate for recurrent diverticulitis, bowel obstruction, appendicitis, perforated viscus, or other intra-abdominal pathology.  I will give morphine and  Zofran for symptoms and reevaluate for ultimate disposition.  The patient's labs are reassuring.  CT scan is consistent with diverticulitis.  The patient is given Augmentin.  He is feeling better on reassessment.  Think that is stable for discharge.  Amount and/or Complexity of Data Reviewed Labs: ordered. Radiology: ordered.  Risk Prescription drug management.           Final Clinical Impression(s) / ED Diagnoses Final diagnoses:  Diverticulitis  Dispo: discharge  Rx / DC Orders ED Discharge Orders          Ordered    amoxicillin-clavulanate (AUGMENTIN) 875-125 MG tablet  Every 12 hours        01/26/23 2027              Durwin Glaze, MD 01/26/23 2028

## 2023-01-26 NOTE — Discharge Instructions (Addendum)
Your CT scan shows diverticulitis.  Please take your home pain medications and start the Augmentin.  Follow-up with your doctor.  Please return to the ER for worsening symptoms.

## 2023-01-28 NOTE — Progress Notes (Addendum)
Anesthesia Review:  PCP: Cardiologist :DR Koren Shiver- clearance dated 12/25/22 on chart  LOV 09/01/22 on chart  clearance dated 07/18/22 on chart  Clearance dated 11/25/22 on chart  Chest x-ray : EKG : Echo : 10/17/22 on chart  Stress test: 2019 on chart  Cardiac Cath :  Activity level:  Sleep Study/ CPAP : Fasting Blood Sugar :      / Checks Blood Sugar -- times a day:   Blood Thinner/ Instructions /Last Dose: ASA / Instructions/ Last Dose :    Eliquis -    01/26/23- cbc, cmp and u/a in epic   In ED 01/26/23 for diverticulitis d/C 01/26/23.

## 2023-01-28 NOTE — Patient Instructions (Addendum)
SURGICAL WAITING ROOM VISITATION  Patients having surgery or a procedure may have no more than 2 support people in the waiting area - these visitors may rotate.    Children under the age of 59 must have an adult with them who is not the patient.  Due to an increase in RSV and influenza rates and associated hospitalizations, children ages 27 and under may not visit patients in [facility] hospitals.  If the patient needs to stay at the hospital during part of their recovery, the visitor guidelines for inpatient rooms apply. Pre-op nurse will coordinate an appropriate time for 1 support person to accompany patient in pre-op.  This support person may not rotate.    Please refer to the [facility] website for the visitor guidelines for Inpatients (after your surgery is over and you are in a regular room).       Your procedure is scheduled on:  02/19/23    Report to [facility] Main Entrance    Report to admitting at  0800  AM   Call this number if you have problems the morning of surgery [phone]   Do not eat food :After Midnight.   After Midnight you may have the following liquids until _0715_____ AM  DAY OF SURGERY  Water Non-Citrus Juices (without pulp, NO RED-Apple, White grape, White cranberry) Black Coffee (NO MILK/CREAM OR CREAMERS, sugar ok)  Clear Tea (NO MILK/CREAM OR CREAMERS, sugar ok) regular and decaf                             Plain Jell-O (NO RED)                                           Fruit ices (not with fruit pulp, NO RED)                                     Popsicles (NO RED)                                                               Sports drinks like Gatorade (NO RED)                   The day of surgery:  Drink ONE (1) Pre-Surgery Clear Ensure or G2 at  0715AM  ( have completed by ) the morning of surgery. Drink in one sitting. Do not sip.  This drink was given to you during your hospital  pre-op appointment visit. Nothing else to drink  after completing the  Pre-Surgery Clear Ensure or G2.          If you have questions, please contact your surgeon's office.       Oral Hygiene is also important to reduce your risk of infection.                                    Remember - BRUSH YOUR TEETH THE MORNING OF SURGERY WITH YOUR  REGULAR TOOTHPASTE  DENTURES WILL BE REMOVED PRIOR TO SURGERY PLEASE DO NOT APPLY "Poly grip" OR ADHESIVES!!!   Do NOT smoke after Midnight   Stop all vitamins and herbal supplements 7 days before surgery.   Take these medicines the morning of surgery with A SIP OF WATER: atenolol proscar, flomax   DO NOT TAKE ANY ORAL DIABETIC MEDICATIONS DAY OF YOUR SURGERY  Bring CPAP mask and tubing day of surgery.                              You may not have any metal on your body including hair pins, jewelry, and body piercing             Do not wear make-up, lotions, powders, perfumes/cologne, or deodorant  Do not wear nail polish including gel and S&S, artificial/acrylic nails, or any other type of covering on natural nails including finger and toenails. If you have artificial nails, gel coating, etc. that needs to be removed by a nail salon please have this removed prior to surgery or surgery may need to be canceled/ delayed if the surgeon/ anesthesia feels like they are unable to be safely monitored.   Do not shave  48 hours prior to surgery.               Men may shave face and neck.   Do not bring valuables to the hospital. [facility] IS NOT             RESPONSIBLE   FOR VALUABLES.   Contacts, glasses, dentures or bridgework may not be worn into surgery.   Bring small overnight bag day of surgery.   DO NOT BRING YOUR HOME MEDICATIONS TO THE HOSPITAL. PHARMACY WILL DISPENSE MEDICATIONS LISTED ON YOUR MEDICATION LIST TO YOU DURING YOUR ADMISSION IN THE HOSPITAL!    Patients discharged on the day of surgery will not be allowed to drive home.  Someone NEEDS to stay with you for the first 24  hours after anesthesia.   Special Instructions: Bring a copy of your healthcare power of attorney and living will documents the day of surgery if you haven't scanned them before.              Please read over the following fact sheets you were given: IF YOU HAVE QUESTIONS ABOUT YOUR PRE-OP INSTRUCTIONS PLEASE CALL [phone]   If you received a COVID test during your pre-op visit  it is requested that you wear a mask when out in public, stay away from anyone that may not be feeling well and notify your surgeon if you develop symptoms. If you test positive for Covid or have been in contact with anyone that has tested positive in the last 10 days please notify you surgeon.      Pre-operative 5 CHG Bath Instructions   You can play a key role in reducing the risk of infection after surgery. Your skin needs to be as free of germs as possible. You can reduce the number of germs on your skin by washing with CHG (chlorhexidine gluconate) soap before surgery. CHG is an antiseptic soap that kills germs and continues to kill germs even after washing.   DO NOT use if you have an allergy to chlorhexidine/CHG or antibacterial soaps. If your skin becomes reddened or irritated, stop using the CHG and notify one of our RNs at [phone].   Please shower with the CHG soap starting  4 days before surgery using the following schedule:     Please keep in mind the following:  DO NOT shave, including legs and underarms, starting the day of your first shower.   You may shave your face at any point before/day of surgery.  Place clean sheets on your bed the day you start using CHG soap. Use a clean washcloth (not used since being washed) for each shower. DO NOT sleep with pets once you start using the CHG.   CHG Shower Instructions:  If you choose to wash your hair and private area, wash first with your normal shampoo/soap.  After you use shampoo/soap, rinse your hair and body thoroughly to remove  shampoo/soap residue.  Turn the water OFF and apply about 3 tablespoons (45 ml) of CHG soap to a CLEAN washcloth.  Apply CHG soap ONLY FROM YOUR NECK DOWN TO YOUR TOES (washing for 3-5 minutes)  DO NOT use CHG soap on face, private areas, open wounds, or sores.  Pay special attention to the area where your surgery is being performed.  If you are having back surgery, having someone wash your back for you may be helpful. Wait 2 minutes after CHG soap is applied, then you may rinse off the CHG soap.  Pat dry with a clean towel  Put on clean clothes/pajamas   If you choose to wear lotion, please use ONLY the CHG-compatible lotions on the back of this paper.     Additional instructions for the day of surgery: DO NOT APPLY any lotions, deodorants, cologne, or perfumes.   Put on clean/comfortable clothes.  Brush your teeth.  Ask your nurse before applying any prescription medications to the skin.      CHG Compatible Lotions   Aveeno Moisturizing lotion  Cetaphil Moisturizing Cream  Cetaphil Moisturizing Lotion  Clairol Herbal Essence Moisturizing Lotion, Dry Skin  Clairol Herbal Essence Moisturizing Lotion, Extra Dry Skin  Clairol Herbal Essence Moisturizing Lotion, Normal Skin  Curel Age Defying Therapeutic Moisturizing Lotion with Alpha Hydroxy  Curel Extreme Care Body Lotion  Curel Soothing Hands Moisturizing Hand Lotion  Curel Therapeutic Moisturizing Cream, Fragrance-Free  Curel Therapeutic Moisturizing Lotion, Fragrance-Free  Curel Therapeutic Moisturizing Lotion, Original Formula  Eucerin Daily Replenishing Lotion  Eucerin Dry Skin Therapy Plus Alpha Hydroxy Crme  Eucerin Dry Skin Therapy Plus Alpha Hydroxy Lotion  Eucerin Original Crme  Eucerin Original Lotion  Eucerin Plus Crme Eucerin Plus Lotion  Eucerin TriLipid Replenishing Lotion  Keri Anti-Bacterial Hand Lotion  Keri Deep Conditioning Original Lotion Dry Skin Formula Softly Scented  Keri Deep Conditioning  Original Lotion, Fragrance Free Sensitive Skin Formula  Keri Lotion Fast Absorbing Fragrance Free Sensitive Skin Formula  Keri Lotion Fast Absorbing Softly Scented Dry Skin Formula  Keri Original Lotion  Keri Skin Renewal Lotion Keri Silky Smooth Lotion  Keri Silky Smooth Sensitive Skin Lotion  Nivea Body Creamy Conditioning Oil  Nivea Body Extra Enriched Teacher, adult education Moisturizing Lotion Nivea Crme  Nivea Skin Firming Lotion  NutraDerm 30 Skin Lotion  NutraDerm Skin Lotion  NutraDerm Therapeutic Skin Cream  NutraDerm Therapeutic Skin Lotion  ProShield Protective Hand Cream  Provon moisturizing lotion

## 2023-02-04 NOTE — Progress Notes (Signed)
Sent message, via epic in basket, requesting orders in epic from surgeon.  

## 2023-02-09 ENCOUNTER — Ambulatory Visit: Payer: Self-pay | Admitting: Student

## 2023-02-09 NOTE — H&P (View-Only) (Signed)
TOTAL KNEE ADMISSION H&P  Patient is being admitted for left total knee arthroplasty.  Subjective:  Chief Complaint:left knee pain.  HPI: Chad Guerra, 72 y.o. male, has a history of pain and functional disability in the left knee due to arthritis and has failed non-surgical conservative treatments for greater than 12 weeks to includeNSAID's and/or analgesics, corticosteriod injections, flexibility and strengthening excercises, use of assistive devices, and activity modification.  Onset of symptoms was gradual, starting 10 years ago with rapidlly worsening course since that time. The patient noted prior procedures on the knee to include  open left knee surgery with retained hardware  on the left knee(s).  Patient currently rates pain in the left knee(s) at 10 out of 10 with activity. Patient has night pain, worsening of pain with activity and weight bearing, pain that interferes with activities of daily living, pain with passive range of motion, crepitus, and joint swelling.  Patient has evidence of subchondral cysts, subchondral sclerosis, periarticular osteophytes, and joint space narrowing by imaging studies. There is no active infection.  Patient Active Problem List   Diagnosis Date Noted   Acute diverticulitis 09/02/2020   Adenomatous polyp of colon 09/01/2020   Adenoma of left adrenal gland 09/01/2020   Degeneration of intervertebral disc 09/01/2020   BPH (benign prostatic hyperplasia) 09/01/2020   Paroxysmal atrial fibrillation (HCC) 09/01/2020   Peripheral edema 09/01/2020   Phlebitis and thrombophlebitis 09/01/2020   Primary open angle glaucoma 09/01/2020   Supraventricular tachycardia 09/01/2020   Umbilical hernia 09/01/2020   Ventricular tachycardia (HCC) 09/01/2020   Vitamin D deficiency 09/01/2020   History of colonic diverticulitis 09/01/2020   Chronic anticoagulation 09/01/2020   Recurrent periumbilical incisional hernia s/p repair x2  09/01/2020   Bilateral inguinal  hernia (BIH) 09/01/2020   Obesity (BMI 30-39.9) 09/01/2020   Hypertensive urgency 09/01/2020   Diverticulitis of large intestine with perforation without abscess or bleeding 09/01/2020   Bilateral hydrocele 09/01/2020   Posterior equinus, acquired, left 12/29/2017   Posterior tibial tendon dysfunction 12/29/2017   Tenosynovitis of left ankle 05/23/2017   Tightness of heel cord, left 05/23/2017   Subconjunctival hemorrhage of right eye 05/31/2015   Hypertensive retinopathy of both eyes 02/13/2015   Retinal edema 02/13/2015   Branch retinal vein occlusion of both eyes 10/18/2013   Neutropenia - chronic 04/06/2013   History of cholecystectomy 07/04/2012   Past Medical History:  Diagnosis Date   Arthritis    Bilateral hydrocele 09/01/2020   Hypertension    Medical history non-contributory    Paroxysmal atrial fibrillation (HCC) 09/01/2020   Mar 26, 2018 Entered By: Benjiman Core L Comment: Holter 2019   Primary open angle glaucoma 09/01/2020   Sleep apnea     Past Surgical History:  Procedure Laterality Date   ANTERIOR CERVICAL DECOMP/DISCECTOMY FUSION N/A 08/25/2012   Procedure: ANTERIOR CERVICAL DECOMPRESSION/DISCECTOMY FUSION 1 LEVEL (ACDF C5-6);  Surgeon: Venita Lick, MD;  Location: [facility] OR;  Service: Orthopedics;  Laterality: N/A;   ANTERIOR FUSION CERVICAL SPINE  08/25/2012   Dr Shon Baton   CHOLECYSTECTOMY N/A 07/04/2012   Procedure: LAPAROSCOPIC CHOLECYSTECTOMY WITH INTRAOPERATIVE CHOLANGIOGRAM;  Surgeon: Valarie Merino, MD;  Location: [facility];  Service: General;  Laterality: N/A;   HERNIA REPAIR     KNEE ARTHROSCOPY W/ ACL RECONSTRUCTION     LEFT   left ankle and foot srugery      ROTATOR CUFF REPAIR      Current Outpatient Medications  Medication Sig Dispense Refill Last Dose   acetaminophen (TYLENOL) 500  MG tablet Take 1 tablet (500 mg total) by mouth every 6 (six) hours as needed. 30 tablet 0    amoxicillin-clavulanate (AUGMENTIN) 875-125 MG tablet Take 1  tablet by mouth every 12 (twelve) hours. 14 tablet 0    apixaban (ELIQUIS) 5 MG TABS tablet Take 5 mg by mouth 2 (two) times daily.      atenolol (TENORMIN) 50 MG tablet Take 50 mg by mouth daily.      cholecalciferol (VITAMIN D) 1000 UNITS tablet Take 1,000 Units by mouth daily.      finasteride (PROSCAR) 5 MG tablet Take 5 mg by mouth daily.      HYDROcodone-acetaminophen (NORCO/VICODIN) 5-325 MG per tablet Take 1-2 tablets by mouth every 6 hours as needed for pain. (Patient not taking: Reported on 02/04/2023) 15 tablet 0    lidocaine 4 % Place 1 patch onto the skin 2 (two) times daily. (Patient not taking: Reported on 02/04/2023) 12 patch 0    lisinopril (ZESTRIL) 40 MG tablet Take 40 mg by mouth daily.      methocarbamol (ROBAXIN) 500 MG tablet Take 500 mg by mouth every 6 (six) hours as needed for muscle spasms.      Multiple Vitamin (MULTIVITAMIN WITH MINERALS) TABS Take 1 tablet by mouth daily.      tamsulosin (FLOMAX) 0.4 MG CAPS capsule Take 0.4 mg by mouth 2 (two) times daily.      No current facility-administered medications for this visit.   Allergies  Allergen Reactions   Contrast Media [Iodinated Contrast Media] Nausea Only   Percocet [Oxycodone-Acetaminophen] Other (See Comments)    Shakes, chills, can tolerate tylenol    Social History   Tobacco Use   Smoking status: Never   Smokeless tobacco: Never  Substance Use Topics   Alcohol use: No    Family History  Problem Relation Age of Onset   Heart disease Neg Hx      Review of Systems  Gastrointestinal:  Positive for abdominal pain (recent diverticulitis diagnosis.).  Musculoskeletal:  Positive for arthralgias, gait problem and joint swelling.  All other systems reviewed and are negative.   Objective:  Physical Exam Constitutional:      Appearance: Normal appearance.  HENT:     Head: Normocephalic and atraumatic.     Nose: Nose normal.     Mouth/Throat:     Mouth: Mucous membranes are moist.     Pharynx:  Oropharynx is clear.  Eyes:     Conjunctiva/sclera: Conjunctivae normal.  Cardiovascular:     Rate and Rhythm: Normal rate and regular rhythm.     Pulses: Normal pulses.     Heart sounds: Normal heart sounds.  Pulmonary:     Effort: Pulmonary effort is normal.     Breath sounds: Normal breath sounds.  Abdominal:     General: Abdomen is flat.  Genitourinary:    Comments: deferred Musculoskeletal:     Cervical back: Normal range of motion and neck supple.     Comments: Examination of the left knee reveals healed surgical incisions. He has some swelling. Trace effusion. No warmth or erythema. Tenderness to palpation medial joint line, lateral joint line, peripatellar retinacular tissues with positive grind sign. Focal swelling and tenderness to palpation over the Pes anserine insertion. Range of motion is 0 to 105 degrees. No ligamentous instability. Painless range of motion of the hip.  Distally, there is no focal motor or sensory deficit. Is palpable pedal pulses.  Skin:    General: Skin is warm  and dry.     Capillary Refill: Capillary refill takes less than 2 seconds.  Neurological:     General: No focal deficit present.     Mental Status: He is alert and oriented to person, place, and time.  Psychiatric:        Mood and Affect: Mood normal.        Behavior: Behavior normal.        Thought Content: Thought content normal.        Judgment: Judgment normal.     Vital signs in last 24 hours: @VSRANGES @  Labs:   Estimated body mass index is 31.71 kg/m as calculated from the following:   Height as of 02/11/23: 5\' 10"  (1.778 m).   Weight as of 02/11/23: 100.2 kg.   Imaging Review Plain radiographs demonstrate severe degenerative joint disease of the left knee(s). The overall alignment issignificant varus. The bone quality appears to be adequate for age and reported activity level.      Assessment/Plan:  End stage arthritis, left knee   The patient history, physical  examination, clinical judgment of the provider and imaging studies are consistent with end stage degenerative joint disease of the left knee(s) and total knee arthroplasty is deemed medically necessary. The treatment options including medical management, injection therapy arthroscopy and arthroplasty were discussed at length. The risks and benefits of total knee arthroplasty were presented and reviewed. The risks due to aseptic loosening, infection, stiffness, patella tracking problems, thromboembolic complications and other imponderables were discussed. The patient acknowledged the explanation, agreed to proceed with the plan and consent was signed. Patient is being admitted for inpatient treatment for surgery, pain control, PT, OT, prophylactic antibiotics, VTE prophylaxis, progressive ambulation and ADL's and discharge planning. The patient is planning to be discharged home with OPPT after an overnight stay.   Therapy Plans: outpatient therapy. PT at [facility] 02/23/23.  Disposition: Home with wife Planned DVT Prophylaxis: Eliquis 5mg  BID DME needed: walker and ice machine. Printed orders and given to patient and copy sent to [place].  PCP: Cleared.  Cardiology: Cleared. Stop Eliquis 3-5 days prior to surgery.  TXA: IV Allergies:  - Percocet - shaking and diaphoresis.  - Contrast dye - unsure reaction.  Anesthesia Concerns: None.  BMI: 33.1 Last HgbA1c: not diabetic.  Other: - Recent diverticulitis, on Augmentin 01/27/23. We discussed this has to be resolved prior to surgery. Patient to call next week and let us know how he's doing. If not resolved will have to postpone surgery. Patient aware.  - A-fib, Eliquis. Patient to stop Eliquis 3 days prior to surgery. Will resume postop.  - OSA, doesn't use CPAP.  - H/o LLE skin graft - Venous insufficiency.  - Posttraumatic left knee, open left knee surgery with retained HW.  - Hydrocodone vs dilaudid, does not tolerate percocet, zofran.  -  Medication to [employer] and [facility].  - 01/27/23: Hgb 14.0, Cr. 1.03, K+ 3.8.  - 02/11/23: Hgb 14.6, K+ 3.9, Cr. 1.30.     Patient's anticipated LOS is less than 2 midnights, meeting these requirements: - Younger than 86 - Lives within 1 hour of care - Has a competent adult at home to recover with post-op recover - NO history of  - Chronic pain requiring opiods  - Diabetes  - Coronary Artery Disease  - Heart failure  - Heart attack  - Stroke  - DVT/VTE  - Cardiac arrhythmia  - Respiratory Failure/COPD  - Renal failure  - Anemia  -  Advanced Liver disease

## 2023-02-09 NOTE — H&P (Signed)
TOTAL KNEE ADMISSION H&P  Patient is being admitted for left total knee arthroplasty.  Subjective:  Chief Complaint:left knee pain.  HPI: Chad Guerra, 72 y.o. male, has a history of pain and functional disability in the left knee due to arthritis and has failed non-surgical conservative treatments for greater than 12 weeks to includeNSAID's and/or analgesics, corticosteriod injections, flexibility and strengthening excercises, use of assistive devices, and activity modification.  Onset of symptoms was gradual, starting 10 years ago with rapidlly worsening course since that time. The patient noted prior procedures on the knee to include  open left knee surgery with retained hardware  on the left knee(s).  Patient currently rates pain in the left knee(s) at 10 out of 10 with activity. Patient has night pain, worsening of pain with activity and weight bearing, pain that interferes with activities of daily living, pain with passive range of motion, crepitus, and joint swelling.  Patient has evidence of subchondral cysts, subchondral sclerosis, periarticular osteophytes, and joint space narrowing by imaging studies. There is no active infection.  Patient Active Problem List   Diagnosis Date Noted   Acute diverticulitis 09/02/2020   Adenomatous polyp of colon 09/01/2020   Adenoma of left adrenal gland 09/01/2020   Degeneration of intervertebral disc 09/01/2020   BPH (benign prostatic hyperplasia) 09/01/2020   Paroxysmal atrial fibrillation (HCC) 09/01/2020   Peripheral edema 09/01/2020   Phlebitis and thrombophlebitis 09/01/2020   Primary open angle glaucoma 09/01/2020   Supraventricular tachycardia 09/01/2020   Umbilical hernia 09/01/2020   Ventricular tachycardia (HCC) 09/01/2020   Vitamin D deficiency 09/01/2020   History of colonic diverticulitis 09/01/2020   Chronic anticoagulation 09/01/2020   Recurrent periumbilical incisional hernia s/p repair x2  09/01/2020   Bilateral inguinal  hernia (BIH) 09/01/2020   Obesity (BMI 30-39.9) 09/01/2020   Hypertensive urgency 09/01/2020   Diverticulitis of large intestine with perforation without abscess or bleeding 09/01/2020   Bilateral hydrocele 09/01/2020   Posterior equinus, acquired, left 12/29/2017   Posterior tibial tendon dysfunction 12/29/2017   Tenosynovitis of left ankle 05/23/2017   Tightness of heel cord, left 05/23/2017   Subconjunctival hemorrhage of right eye 05/31/2015   Hypertensive retinopathy of both eyes 02/13/2015   Retinal edema 02/13/2015   Branch retinal vein occlusion of both eyes 10/18/2013   Neutropenia - chronic 04/06/2013   History of cholecystectomy 07/04/2012   Past Medical History:  Diagnosis Date   Arthritis    Bilateral hydrocele 09/01/2020   Hypertension    Medical history non-contributory    Paroxysmal atrial fibrillation (HCC) 09/01/2020   Mar 26, 2018 Entered By: Benjiman Core L Comment: Holter 2019   Primary open angle glaucoma 09/01/2020   Sleep apnea     Past Surgical History:  Procedure Laterality Date   ANTERIOR CERVICAL DECOMP/DISCECTOMY FUSION N/A 08/25/2012   Procedure: ANTERIOR CERVICAL DECOMPRESSION/DISCECTOMY FUSION 1 LEVEL (ACDF C5-6);  Surgeon: Venita Lick, MD;  Location: [facility] OR;  Service: Orthopedics;  Laterality: N/A;   ANTERIOR FUSION CERVICAL SPINE  08/25/2012   Dr Shon Baton   CHOLECYSTECTOMY N/A 07/04/2012   Procedure: LAPAROSCOPIC CHOLECYSTECTOMY WITH INTRAOPERATIVE CHOLANGIOGRAM;  Surgeon: Valarie Merino, MD;  Location: [facility];  Service: General;  Laterality: N/A;   HERNIA REPAIR     KNEE ARTHROSCOPY W/ ACL RECONSTRUCTION     LEFT   left ankle and foot srugery      ROTATOR CUFF REPAIR      Current Outpatient Medications  Medication Sig Dispense Refill Last Dose   acetaminophen (TYLENOL) 500  MG tablet Take 1 tablet (500 mg total) by mouth every 6 (six) hours as needed. 30 tablet 0    amoxicillin-clavulanate (AUGMENTIN) 875-125 MG tablet Take 1  tablet by mouth every 12 (twelve) hours. 14 tablet 0    apixaban (ELIQUIS) 5 MG TABS tablet Take 5 mg by mouth 2 (two) times daily.      atenolol (TENORMIN) 50 MG tablet Take 50 mg by mouth daily.      cholecalciferol (VITAMIN D) 1000 UNITS tablet Take 1,000 Units by mouth daily.      finasteride (PROSCAR) 5 MG tablet Take 5 mg by mouth daily.      HYDROcodone-acetaminophen (NORCO/VICODIN) 5-325 MG per tablet Take 1-2 tablets by mouth every 6 hours as needed for pain. (Patient not taking: Reported on 02/04/2023) 15 tablet 0    lidocaine 4 % Place 1 patch onto the skin 2 (two) times daily. (Patient not taking: Reported on 02/04/2023) 12 patch 0    lisinopril (ZESTRIL) 40 MG tablet Take 40 mg by mouth daily.      methocarbamol (ROBAXIN) 500 MG tablet Take 500 mg by mouth every 6 (six) hours as needed for muscle spasms.      Multiple Vitamin (MULTIVITAMIN WITH MINERALS) TABS Take 1 tablet by mouth daily.      tamsulosin (FLOMAX) 0.4 MG CAPS capsule Take 0.4 mg by mouth 2 (two) times daily.      No current facility-administered medications for this visit.   Allergies  Allergen Reactions   Contrast Media [Iodinated Contrast Media] Nausea Only   Percocet [Oxycodone-Acetaminophen] Other (See Comments)    Shakes, chills, can tolerate tylenol    Social History   Tobacco Use   Smoking status: Never   Smokeless tobacco: Never  Substance Use Topics   Alcohol use: No    Family History  Problem Relation Age of Onset   Heart disease Neg Hx      Review of Systems  Gastrointestinal:  Positive for abdominal pain (recent diverticulitis diagnosis.).  Musculoskeletal:  Positive for arthralgias, gait problem and joint swelling.  All other systems reviewed and are negative.   Objective:  Physical Exam Constitutional:      Appearance: Normal appearance.  HENT:     Head: Normocephalic and atraumatic.     Nose: Nose normal.     Mouth/Throat:     Mouth: Mucous membranes are moist.     Pharynx:  Oropharynx is clear.  Eyes:     Conjunctiva/sclera: Conjunctivae normal.  Cardiovascular:     Rate and Rhythm: Normal rate and regular rhythm.     Pulses: Normal pulses.     Heart sounds: Normal heart sounds.  Pulmonary:     Effort: Pulmonary effort is normal.     Breath sounds: Normal breath sounds.  Abdominal:     General: Abdomen is flat.  Genitourinary:    Comments: deferred Musculoskeletal:     Cervical back: Normal range of motion and neck supple.     Comments: Examination of the left knee reveals healed surgical incisions. He has some swelling. Trace effusion. No warmth or erythema. Tenderness to palpation medial joint line, lateral joint line, peripatellar retinacular tissues with positive grind sign. Focal swelling and tenderness to palpation over the Pes anserine insertion. Range of motion is 0 to 105 degrees. No ligamentous instability. Painless range of motion of the hip.  Distally, there is no focal motor or sensory deficit. Is palpable pedal pulses.  Skin:    General: Skin is warm  and dry.     Capillary Refill: Capillary refill takes less than 2 seconds.  Neurological:     General: No focal deficit present.     Mental Status: He is alert and oriented to person, place, and time.  Psychiatric:        Mood and Affect: Mood normal.        Behavior: Behavior normal.        Thought Content: Thought content normal.        Judgment: Judgment normal.     Vital signs in last 24 hours: @VSRANGES @  Labs:   Estimated body mass index is 31.71 kg/m as calculated from the following:   Height as of 02/11/23: 5\' 10"  (1.778 m).   Weight as of 02/11/23: 100.2 kg.   Imaging Review Plain radiographs demonstrate severe degenerative joint disease of the left knee(s). The overall alignment issignificant varus. The bone quality appears to be adequate for age and reported activity level.      Assessment/Plan:  End stage arthritis, left knee   The patient history, physical  examination, clinical judgment of the provider and imaging studies are consistent with end stage degenerative joint disease of the left knee(s) and total knee arthroplasty is deemed medically necessary. The treatment options including medical management, injection therapy arthroscopy and arthroplasty were discussed at length. The risks and benefits of total knee arthroplasty were presented and reviewed. The risks due to aseptic loosening, infection, stiffness, patella tracking problems, thromboembolic complications and other imponderables were discussed. The patient acknowledged the explanation, agreed to proceed with the plan and consent was signed. Patient is being admitted for inpatient treatment for surgery, pain control, PT, OT, prophylactic antibiotics, VTE prophylaxis, progressive ambulation and ADL's and discharge planning. The patient is planning to be discharged home with OPPT after an overnight stay.   Therapy Plans: outpatient therapy. PT at [facility] 02/23/23.  Disposition: Home with wife Planned DVT Prophylaxis: Eliquis 5mg  BID DME needed: walker and ice machine. Printed orders and given to patient and copy sent to [place].  PCP: Cleared.  Cardiology: Cleared. Stop Eliquis 3-5 days prior to surgery.  TXA: IV Allergies:  - Percocet - shaking and diaphoresis.  - Contrast dye - unsure reaction.  Anesthesia Concerns: None.  BMI: 33.1 Last HgbA1c: not diabetic.  Other: - Recent diverticulitis, on Augmentin 01/27/23. We discussed this has to be resolved prior to surgery. Patient to call next week and let us know how he's doing. If not resolved will have to postpone surgery. Patient aware.  - A-fib, Eliquis. Patient to stop Eliquis 3 days prior to surgery. Will resume postop.  - OSA, doesn't use CPAP.  - H/o LLE skin graft - Venous insufficiency.  - Posttraumatic left knee, open left knee surgery with retained HW.  - Hydrocodone vs dilaudid, does not tolerate percocet, zofran.  -  Medication to [employer] and [facility].  - 01/27/23: Hgb 14.0, Cr. 1.03, K+ 3.8.  - 02/11/23: Hgb 14.6, K+ 3.9, Cr. 1.30.     Patient's anticipated LOS is less than 2 midnights, meeting these requirements: - Younger than 87 - Lives within 1 hour of care - Has a competent adult at home to recover with post-op recover - NO history of  - Chronic pain requiring opiods  - Diabetes  - Coronary Artery Disease  - Heart failure  - Heart attack  - Stroke  - DVT/VTE  - Cardiac arrhythmia  - Respiratory Failure/COPD  - Renal failure  - Anemia  -  Advanced Liver disease

## 2023-02-11 ENCOUNTER — Encounter (HOSPITAL_COMMUNITY)
Admission: RE | Admit: 2023-02-11 | Discharge: 2023-02-11 | Disposition: A | Discharge status: Home / Self Care | Payer: No Typology Code available for payment source | Source: Ambulatory Visit | Attending: Orthopedic Surgery | Admitting: Orthopedic Surgery

## 2023-02-11 ENCOUNTER — Ambulatory Visit: Payer: Self-pay | Admitting: Student

## 2023-02-11 ENCOUNTER — Encounter (HOSPITAL_COMMUNITY): Payer: Self-pay

## 2023-02-11 ENCOUNTER — Other Ambulatory Visit: Payer: Self-pay

## 2023-02-11 VITALS — BP 158/95 | HR 62 | Temp 98.1°F | Resp 16 | Ht 70.0 in | Wt 221.0 lb

## 2023-02-11 DIAGNOSIS — Z01818 Encounter for other preprocedural examination: Secondary | ICD-10-CM | POA: Diagnosis present

## 2023-02-11 DIAGNOSIS — Z01812 Encounter for preprocedural laboratory examination: Secondary | ICD-10-CM | POA: Diagnosis not present

## 2023-02-11 DIAGNOSIS — Z01810 Encounter for preprocedural cardiovascular examination: Secondary | ICD-10-CM | POA: Diagnosis not present

## 2023-02-11 HISTORY — DX: Unspecified osteoarthritis, unspecified site: M19.90

## 2023-02-11 HISTORY — DX: Sleep apnea, unspecified: G47.30

## 2023-02-11 LAB — BASIC METABOLIC PANEL WITH GFR
Anion gap: 9 (ref 5–15)
BUN: 15 mg/dL (ref 8–23)
CO2: 22 mmol/L (ref 22–32)
Calcium: 8.3 mg/dL — ABNORMAL LOW (ref 8.9–10.3)
Chloride: 107 mmol/L (ref 98–111)
Creatinine, Ser: 1.3 mg/dL — ABNORMAL HIGH (ref 0.61–1.24)
GFR, Estimated: 59 mL/min — ABNORMAL LOW (ref >60)
Glucose, Bld: 105 mg/dL — ABNORMAL HIGH (ref 70–99)
Potassium: 3.9 mmol/L (ref 3.5–5.1)
Sodium: 138 mmol/L (ref 135–145)

## 2023-02-11 LAB — CBC
HCT: 45.1 % (ref 39.0–52.0)
Hemoglobin: 14.6 g/dL (ref 13.0–17.0)
MCH: 31.2 pg (ref 26.0–34.0)
MCHC: 32.4 g/dL (ref 30.0–36.0)
MCV: 96.4 fL (ref 80.0–100.0)
Platelets: 189 10*3/uL (ref 150–400)
RBC: 4.68 MIL/uL (ref 4.22–5.81)
RDW: 12.1 % (ref 11.5–15.5)
WBC: 2.9 10*3/uL — ABNORMAL LOW (ref 4.0–10.5)
nRBC: 0 % (ref 0.0–0.2)

## 2023-02-11 LAB — SURGICAL PCR SCREEN
MRSA, PCR: NEGATIVE
Staphylococcus aureus: NEGATIVE

## 2023-02-12 ENCOUNTER — Encounter (HOSPITAL_COMMUNITY): Payer: Self-pay

## 2023-02-12 NOTE — Progress Notes (Signed)
Case: [id number] Date/Time: 02/19/23 1005   Procedure: COMPUTER ASSISTED TOTAL KNEE ARTHROPLASTY (Left: Knee) - 160   Anesthesia type: Spinal   Pre-op diagnosis: Left knee osteoathrtitis   Location: [facility] ROOM 08 / [facility]   Surgeons: Samson Frederic, MD       DISCUSSION: Jezrael Korb is a 72 year old male who presents to PAT prior to surgery above.  Past medical history of hypertension, PAF on Eliquis, OSA, cervical degenerative disc disease s/p cervical fusion (08/2012).  Patient follows with Cardiology for hx of PAF on Eliquis. Last seen on 11/25/22. Had no cardiac complaints. Advised to continue current regimen of medicines. Cardiac clearance signed that patient is optimized from cardiac standpoint on 11/25/22 (paper copy in chart). He will hold Eliquis 3 days prior to surgery.  Patient follows with PCP for chronic medical problems. Last seen on 07/16/22. Per Dr. Ellin Mayhew: "To undergo L TKR. No indication for further testing except labs & EKG. [facility] already requested Cardiology consultation prior to surgery (pt already established & can make an appt). Routine perioperative monitoring & tx, DVT prophylaxis " Medical clearance signed that patient is optimized and low risk. Advised f/u in 1 year.  Of note patient presented to the ED on 01/26/23 for abdominal pain. CT was consistent with diverticulitis and he was treated with Augmentin BID x 7 days. Has completed course at time of PAT visit and reported resolved symptoms.  VS: BP (!) 158/95   Pulse 62   Temp 36.7 C (Oral)   Resp 16   Ht 5\' 10"  (1.778 m)   Wt 100.2 kg   SpO2 98%   BMI 31.71 kg/m   PROVIDERS: Benjiman Core, MD ([place]) Cardiology: Dr. Imogene Burn ([place])   LABS: Labs reviewed: Acceptable for surgery. Chronic leukopenia noted.  (all labs ordered are listed, but only abnormal results are displayed)  Labs Reviewed  CBC - Abnormal; Notable for the following components:      Result Value   WBC 2.9 (*)     All other components within normal limits  BASIC METABOLIC PANEL - Abnormal; Notable for the following components:   Glucose, Bld 105 (*)    Creatinine, Ser 1.30 (*)    Calcium 8.3 (*)    GFR, Estimated 59 (*)    All other components within normal limits  SURGICAL PCR SCREEN     IMAGES:  CT Abdomen/Pelvis 01/26/23:  IMPRESSION: Colonic diverticulosis. Slight inflammatory stranding around the mid sigmoid colon compatible with active diverticulitis. No complicating feature.   Aortic atherosclerosis.     EKG: 02/11/23  Sinus bradycardia with marked sinus arrhythmia, rate 47   CV:  Echo 10/13/2022 (paper copy in chart):  Left ventricle is normal in size There is moderate concentric left ventricular hypertrophy Left ventricular systolic function is normal LVEF 65 to 70% Quantitative LVEF 68% Grade 1 diastolic LV dysfunction with high normal LV filling pressure RV size at the upper limits of normal The RV systolic function is normal Mildly dilated LA The right atrium is moderately dilated Suboptimally visualized aortic valve.  It is probably trileaflet and opens normally No hemodynamically significant valvular aortic stenosis There is mild tricuspid regurgitation Right ventricular systolic pressure is estimated 25 mmHg IVC is normal in size with inspiratory collapse of greater than 50% suggesting normal right atrial pressure Aortic sinus and ascending thoracic aorta at upper limits of normal at 3.7 cm There is no pericardial effusion  Stress test 10/3/209 (paper copy in chart):  Conclusions: Myocardial perfusion stress  study is normal. There is no evidence for infarct or myocardial ischemia Overall left ventricular systolic function is normal. The left ventricular ejection fractures Is 60%. No left ventricular regional wall motion abnormalities Low risk cardiovascular stress study.  HOLTER MONITORING - Date of enrollment: 02/26/2018 [facility] Patch Report Enrollment period:  14 days  Predominant underlying heart rhythm sinus Heart Rate overall max 176 bpm min 50 bpm avg 69 bpm Sinus max 112 bpm min 50 bpm avg 69 bpm Ectopics: Supraventricular Ectopy Isolated frequent, 9.3 % couplet rare, less than 1 % triplet rare, less than 1 % ventricular ectopy Isolated frequent, 6.1 % couplet rare, less than 1 % triplet rare, less than 1 % Longest episode ventricular bigeminy 58.9 seconds; longest episode  ventricular trigeminy 24.3 seconds Arrhythmia episodes: 3 episodes ventricular tachycardia: Fastest interval 5 beats, max rate 160  bpm; longest episode 8 beats, average rate 139 bpm 52 episode supraventricular tachycardia: Fastest interval 4 beats, max rate  176 bpm; longest episode 19 beats, average rate 100 bpm Atrial fibrillation occurred (less than 1% burden) ventricular rate ranging  94-132 bpm (average 112 bpm) Longest episode 2 minutes 53 seconds with  average rate 112 bpm. Idioventricular rhythm present. Patient triggered events: 5 triggered events returned. These correspond to  normal sinus rhythm with PACs and PVCs. Impression: Underlying rhythm is sinus with normal heart rate variability. Atrial fibrillation present, less than 1% burden, longest duration 2 minutes  53 seconds No high-grade AV block or prolonged pauses Symptoms correlate with normal sinus rhythm with PVCs and PACs    Past Medical History:  Diagnosis Date   Arthritis    Bilateral hydrocele 09/01/2020   Hypertension    Medical history non-contributory    Paroxysmal atrial fibrillation (HCC) 09/01/2020   Mar 26, 2018 Entered By: Benjiman Core L Comment: Holter 2019   Primary open angle glaucoma 09/01/2020   Sleep apnea     Past Surgical History:  Procedure Laterality Date   ANTERIOR CERVICAL DECOMP/DISCECTOMY FUSION N/A 08/25/2012   Procedure: ANTERIOR CERVICAL DECOMPRESSION/DISCECTOMY FUSION 1 LEVEL (ACDF C5-6);  Surgeon: Venita Lick, MD;  [facility];   Service: Orthopedics;  Laterality: N/A;   ANTERIOR FUSION CERVICAL SPINE  08/25/2012   Dr Shon Baton   CHOLECYSTECTOMY N/A 07/04/2012   Procedure: LAPAROSCOPIC CHOLECYSTECTOMY WITH INTRAOPERATIVE CHOLANGIOGRAM;  Surgeon: Valarie Merino, MD;  Location: [facility];  Service: General;  Laterality: N/A;   HERNIA REPAIR     KNEE ARTHROSCOPY W/ ACL RECONSTRUCTION     LEFT   left ankle and foot srugery      ROTATOR CUFF REPAIR      MEDICATIONS:  acetaminophen (TYLENOL) 500 MG tablet   amoxicillin-clavulanate (AUGMENTIN) 875-125 MG tablet   apixaban (ELIQUIS) 5 MG TABS tablet   atenolol (TENORMIN) 50 MG tablet   cholecalciferol (VITAMIN D) 1000 UNITS tablet   finasteride (PROSCAR) 5 MG tablet   HYDROcodone-acetaminophen (NORCO/VICODIN) 5-325 MG per tablet   lidocaine 4 %   lisinopril (ZESTRIL) 40 MG tablet   methocarbamol (ROBAXIN) 500 MG tablet   Multiple Vitamin (MULTIVITAMIN WITH MINERALS) TABS   tamsulosin (FLOMAX) 0.4 MG CAPS capsule   No current facility-administered medications for this encounter.   Marcille Blanco [facility] Short Stay/Anesthesiology [facility] Phone [phone] 02/16/2023 8:45 AM

## 2023-02-16 NOTE — Anesthesia Preprocedure Evaluation (Signed)
Anesthesia Evaluation  Patient identified by MRN, date of birth, ID band Patient awake    Reviewed: Allergy & Precautions, NPO status , Patient's Chart, lab work & pertinent test results  Airway Mallampati: II  TM Distance: >3 FB Neck ROM: Full    Dental  (+) Dental Advisory Given   Pulmonary sleep apnea    breath sounds clear to auscultation       Cardiovascular hypertension, Pt. on medications + dysrhythmias Atrial Fibrillation  Rhythm:Regular Rate:Normal     Neuro/Psych negative neurological ROS     GI/Hepatic negative GI ROS, Neg liver ROS,,,  Endo/Other  negative endocrine ROS    Renal/GU negative Renal ROS     Musculoskeletal  (+) Arthritis ,    Abdominal   Peds  Hematology Last Eliquis 9/22   Anesthesia Other Findings   Reproductive/Obstetrics                              Lab Results  Component Value Date   WBC 2.9 (L) 02/11/2023   HGB 14.6 02/11/2023   HCT 45.1 02/11/2023   MCV 96.4 02/11/2023   PLT 189 02/11/2023   Lab Results  Component Value Date   NA 138 02/11/2023   CL 107 02/11/2023   K 3.9 02/11/2023   CO2 22 02/11/2023   BUN 15 02/11/2023   CREATININE 1.30 (H) 02/11/2023   GFRNONAA 59 (L) 02/11/2023   CALCIUM 8.3 (L) 02/11/2023   PHOS 3.5 09/03/2020   ALBUMIN 3.7 01/26/2023   GLUCOSE 105 (H) 02/11/2023    Anesthesia Physical Anesthesia Plan  ASA: 2  Anesthesia Plan: Spinal and MAC   Post-op Pain Management: Regional block* and Tylenol PO (pre-op)*   Induction:   PONV Risk Score and Plan: 1 and Propofol infusion, Dexamethasone and Ondansetron  Airway Management Planned: Natural Airway and Simple Face Mask  Additional Equipment:   Intra-op Plan:   Post-operative Plan:   Informed Consent: I have reviewed the patients History and Physical, chart, labs and discussed the procedure including the risks, benefits and alternatives for the proposed  anesthesia with the patient or authorized representative who has indicated his/her understanding and acceptance.     Dental advisory given  Plan Discussed with: CRNA  Anesthesia Plan Comments: ( )         Anesthesia Quick Evaluation

## 2023-02-18 ENCOUNTER — Encounter (HOSPITAL_COMMUNITY): Payer: Self-pay | Admitting: Orthopedic Surgery

## 2023-02-19 ENCOUNTER — Ambulatory Visit (HOSPITAL_COMMUNITY): Payer: No Typology Code available for payment source

## 2023-02-19 ENCOUNTER — Ambulatory Visit (HOSPITAL_COMMUNITY)
Admission: RE | Admit: 2023-02-19 | Discharge: 2023-02-20 | Disposition: A | Discharge status: Home / Self Care | Payer: No Typology Code available for payment source | Attending: Orthopedic Surgery | Admitting: Orthopedic Surgery

## 2023-02-19 ENCOUNTER — Encounter (HOSPITAL_COMMUNITY)
Admission: RE | Disposition: A | Discharge status: Home / Self Care | Payer: Self-pay | Source: Home / Self Care | Attending: Orthopedic Surgery

## 2023-02-19 ENCOUNTER — Other Ambulatory Visit: Payer: Self-pay

## 2023-02-19 ENCOUNTER — Ambulatory Visit (HOSPITAL_COMMUNITY): Payer: No Typology Code available for payment source | Admitting: Medical

## 2023-02-19 ENCOUNTER — Ambulatory Visit (HOSPITAL_COMMUNITY): Payer: No Typology Code available for payment source | Admitting: Anesthesiology

## 2023-02-19 ENCOUNTER — Encounter (HOSPITAL_COMMUNITY): Payer: Self-pay | Admitting: Orthopedic Surgery

## 2023-02-19 DIAGNOSIS — I10 Essential (primary) hypertension: Secondary | ICD-10-CM | POA: Insufficient documentation

## 2023-02-19 DIAGNOSIS — Z7901 Long term (current) use of anticoagulants: Secondary | ICD-10-CM | POA: Diagnosis not present

## 2023-02-19 DIAGNOSIS — M1712 Unilateral primary osteoarthritis, left knee: Secondary | ICD-10-CM | POA: Diagnosis present

## 2023-02-19 DIAGNOSIS — I480 Paroxysmal atrial fibrillation: Secondary | ICD-10-CM | POA: Insufficient documentation

## 2023-02-19 DIAGNOSIS — Z01818 Encounter for other preprocedural examination: Secondary | ICD-10-CM

## 2023-02-19 DIAGNOSIS — Z96652 Presence of left artificial knee joint: Secondary | ICD-10-CM

## 2023-02-19 HISTORY — PX: KNEE ARTHROPLASTY: SHX992

## 2023-02-19 SURGERY — ARTHROPLASTY, KNEE, TOTAL, USING IMAGELESS COMPUTER-ASSISTED NAVIGATION
Anesthesia: Monitor Anesthesia Care | Site: Knee | Laterality: Left

## 2023-02-19 MED ORDER — FENTANYL CITRATE PF 50 MCG/ML IJ SOSY
50.0000 ug | PREFILLED_SYRINGE | Freq: Once | INTRAMUSCULAR | Status: AC
  Administered 2023-02-19: 50 ug via INTRAVENOUS
  Filled 2023-02-19: qty 2

## 2023-02-19 MED ORDER — ONDANSETRON HCL 4 MG PO TABS: 4.0000 mg | ORAL_TABLET | Freq: Four times a day (QID) | ORAL | Status: DC | PRN

## 2023-02-19 MED ORDER — ONDANSETRON HCL 4 MG/2ML IJ SOLN: 4.0000 mg | Freq: Four times a day (QID) | INTRAMUSCULAR | Status: DC | PRN

## 2023-02-19 MED ORDER — PROPOFOL 500 MG/50ML IV EMUL
INTRAVENOUS | Status: DC | PRN
  Administered 2023-02-19: 75 ug/kg/min via INTRAVENOUS

## 2023-02-19 MED ORDER — LIDOCAINE 2% (20 MG/ML) 5 ML SYRINGE
INTRAMUSCULAR | Status: DC | PRN
  Administered 2023-02-19: 60 mg via INTRAVENOUS

## 2023-02-19 MED ORDER — PROPOFOL 500 MG/50ML IV EMUL
INTRAVENOUS | Status: AC
  Filled 2023-02-19: qty 50

## 2023-02-19 MED ORDER — GLYCOPYRROLATE 0.2 MG/ML IJ SOLN
INTRAMUSCULAR | Status: DC | PRN
Start: 2023-02-19 — End: 2023-02-19
  Administered 2023-02-19: .1 mg via INTRAVENOUS
  Administered 2023-02-19: .2 mg via INTRAVENOUS

## 2023-02-19 MED ORDER — 0.9 % SODIUM CHLORIDE (POUR BTL) OPTIME
TOPICAL | Status: DC | PRN
Start: 2023-02-19 — End: 2023-02-19
  Administered 2023-02-19: 1000 mL

## 2023-02-19 MED ORDER — DOCUSATE SODIUM 100 MG PO CAPS
100.0000 mg | ORAL_CAPSULE | Freq: Two times a day (BID) | ORAL | Status: DC
  Administered 2023-02-19 – 2023-02-20 (×2): 100 mg via ORAL
  Filled 2023-02-19 (×2): qty 1

## 2023-02-19 MED ORDER — ISOPROPYL ALCOHOL 70 % SOLN
Status: DC | PRN
  Administered 2023-02-19: 1 via TOPICAL

## 2023-02-19 MED ORDER — DEXAMETHASONE SODIUM PHOSPHATE 10 MG/ML IJ SOLN
INTRAMUSCULAR | Status: DC | PRN
  Administered 2023-02-19: 8 mg via INTRAVENOUS

## 2023-02-19 MED ORDER — SODIUM CHLORIDE 0.9 % IV SOLN: INTRAVENOUS | Status: DC

## 2023-02-19 MED ORDER — GLYCOPYRROLATE 0.2 MG/ML IJ SOLN
INTRAMUSCULAR | Status: AC
  Filled 2023-02-19: qty 1

## 2023-02-19 MED ORDER — CLONIDINE HCL (ANALGESIA) 100 MCG/ML EP SOLN
EPIDURAL | Status: DC | PRN
Start: 2023-02-19 — End: 2023-02-19
  Administered 2023-02-19: 50 ug

## 2023-02-19 MED ORDER — FINASTERIDE 5 MG PO TABS
5.0000 mg | ORAL_TABLET | Freq: Every day | ORAL | Status: DC
  Administered 2023-02-20: 5 mg via ORAL
  Filled 2023-02-19: qty 1

## 2023-02-19 MED ORDER — PANTOPRAZOLE SODIUM 40 MG PO TBEC
40.0000 mg | DELAYED_RELEASE_TABLET | Freq: Every day | ORAL | Status: DC
  Administered 2023-02-20: 40 mg via ORAL
  Filled 2023-02-19: qty 1

## 2023-02-19 MED ORDER — MIDAZOLAM HCL 2 MG/2ML IJ SOLN: 1.0000 mg | Freq: Once | INTRAMUSCULAR | Status: DC

## 2023-02-19 MED ORDER — SENNA 8.6 MG PO TABS
1.0000 | ORAL_TABLET | Freq: Two times a day (BID) | ORAL | Status: DC
  Administered 2023-02-19 – 2023-02-20 (×2): 8.6 mg via ORAL
  Filled 2023-02-19 (×2): qty 1

## 2023-02-19 MED ORDER — HYDROCODONE-ACETAMINOPHEN 7.5-325 MG PO TABS
1.0000 | ORAL_TABLET | ORAL | Status: DC | PRN
  Administered 2023-02-19 – 2023-02-20 (×4): 2 via ORAL
  Filled 2023-02-19 (×4): qty 2

## 2023-02-19 MED ORDER — TAMSULOSIN HCL 0.4 MG PO CAPS
0.4000 mg | ORAL_CAPSULE | Freq: Two times a day (BID) | ORAL | Status: DC
  Administered 2023-02-19 – 2023-02-20 (×2): 0.4 mg via ORAL
  Filled 2023-02-19 (×2): qty 1

## 2023-02-19 MED ORDER — POVIDONE-IODINE 10 % EX SWAB: 2.0000 | Freq: Once | CUTANEOUS | Status: DC

## 2023-02-19 MED ORDER — APIXABAN 2.5 MG PO TABS
2.5000 mg | ORAL_TABLET | Freq: Two times a day (BID) | ORAL | Status: DC
  Administered 2023-02-20: 2.5 mg via ORAL
  Filled 2023-02-19: qty 1

## 2023-02-19 MED ORDER — POLYETHYLENE GLYCOL 3350 17 G PO PACK: 17.0000 g | PACK | Freq: Every day | ORAL | Status: DC | PRN

## 2023-02-19 MED ORDER — ALUM & MAG HYDROXIDE-SIMETH 200-200-20 MG/5ML PO SUSP: 30.0000 mL | ORAL | Status: DC | PRN

## 2023-02-19 MED ORDER — METHOCARBAMOL 500 MG PO TABS
500.0000 mg | ORAL_TABLET | Freq: Four times a day (QID) | ORAL | Status: DC | PRN
  Administered 2023-02-19: 500 mg via ORAL
  Filled 2023-02-19: qty 1

## 2023-02-19 MED ORDER — METOCLOPRAMIDE HCL 5 MG/ML IJ SOLN: 5.0000 mg | Freq: Three times a day (TID) | INTRAMUSCULAR | Status: DC | PRN

## 2023-02-19 MED ORDER — ACETAMINOPHEN 500 MG PO TABS
1000.0000 mg | ORAL_TABLET | Freq: Once | ORAL | Status: AC
  Administered 2023-02-19: 1000 mg via ORAL
  Filled 2023-02-19: qty 2

## 2023-02-19 MED ORDER — HYDROCODONE-ACETAMINOPHEN 5-325 MG PO TABS: 1.0000 | ORAL_TABLET | ORAL | Status: DC | PRN

## 2023-02-19 MED ORDER — SODIUM CHLORIDE 0.9% IV SOLUTION
INTRAVENOUS | Status: AC | PRN
  Administered 2023-02-19: 1000 mL

## 2023-02-19 MED ORDER — SODIUM CHLORIDE (PF) 0.9 % IJ SOLN
INTRAMUSCULAR | Status: DC | PRN
  Administered 2023-02-19: 30 mL

## 2023-02-19 MED ORDER — ATENOLOL 50 MG PO TABS
50.0000 mg | ORAL_TABLET | Freq: Every day | ORAL | Status: DC
  Administered 2023-02-20: 50 mg via ORAL
  Filled 2023-02-19: qty 1

## 2023-02-19 MED ORDER — MORPHINE SULFATE (PF) 2 MG/ML IV SOLN
0.5000 mg | INTRAVENOUS | Status: DC | PRN
  Administered 2023-02-19 (×2): 1 mg via INTRAVENOUS
  Filled 2023-02-19 (×2): qty 1

## 2023-02-19 MED ORDER — ORAL CARE MOUTH RINSE: 15.0000 mL | Freq: Once | OROMUCOSAL | Status: AC

## 2023-02-19 MED ORDER — DIPHENHYDRAMINE HCL 12.5 MG/5ML PO ELIX: 12.5000 mg | ORAL_SOLUTION | ORAL | Status: DC | PRN

## 2023-02-19 MED ORDER — KETOROLAC TROMETHAMINE 30 MG/ML IJ SOLN
INTRAMUSCULAR | Status: AC
  Filled 2023-02-19: qty 1

## 2023-02-19 MED ORDER — EPHEDRINE 5 MG/ML INJ
INTRAVENOUS | Status: AC
  Filled 2023-02-19: qty 5

## 2023-02-19 MED ORDER — FENTANYL CITRATE (PF) 100 MCG/2ML IJ SOLN
INTRAMUSCULAR | Status: AC
  Filled 2023-02-19: qty 2

## 2023-02-19 MED ORDER — PROPOFOL 1000 MG/100ML IV EMUL
INTRAVENOUS | Status: AC
  Filled 2023-02-19: qty 100

## 2023-02-19 MED ORDER — ACETAMINOPHEN 325 MG PO TABS: 325.0000 mg | ORAL_TABLET | Freq: Four times a day (QID) | ORAL | Status: DC | PRN

## 2023-02-19 MED ORDER — MENTHOL 3 MG MT LOZG: 1.0000 | LOZENGE | OROMUCOSAL | Status: DC | PRN

## 2023-02-19 MED ORDER — BUPIVACAINE IN DEXTROSE 0.75-8.25 % IT SOLN
INTRATHECAL | Status: DC | PRN
  Administered 2023-02-19: 1.8 mL via INTRATHECAL

## 2023-02-19 MED ORDER — CHLORHEXIDINE GLUCONATE 0.12 % MT SOLN
15.0000 mL | Freq: Once | OROMUCOSAL | Status: AC
  Administered 2023-02-19: 15 mL via OROMUCOSAL

## 2023-02-19 MED ORDER — CEFAZOLIN SODIUM-DEXTROSE 2-4 GM/100ML-% IV SOLN
2.0000 g | INTRAVENOUS | Status: AC
  Administered 2023-02-19: 2 g via INTRAVENOUS
  Filled 2023-02-19: qty 100

## 2023-02-19 MED ORDER — SODIUM CHLORIDE (PF) 0.9 % IJ SOLN
INTRAMUSCULAR | Status: AC
  Filled 2023-02-19: qty 50

## 2023-02-19 MED ORDER — POVIDONE-IODINE 10 % EX SWAB
2.0000 | Freq: Once | CUTANEOUS | Status: AC
  Administered 2023-02-19: 2 via TOPICAL

## 2023-02-19 MED ORDER — KETOROLAC TROMETHAMINE 30 MG/ML IJ SOLN
INTRAMUSCULAR | Status: DC | PRN
  Administered 2023-02-19: 30 mg

## 2023-02-19 MED ORDER — TRANEXAMIC ACID-NACL 1000-0.7 MG/100ML-% IV SOLN
1000.0000 mg | INTRAVENOUS | Status: AC
  Administered 2023-02-19: 1000 mg via INTRAVENOUS
  Filled 2023-02-19: qty 100

## 2023-02-19 MED ORDER — BISACODYL 10 MG RE SUPP: 10.0000 mg | Freq: Every day | RECTAL | Status: DC | PRN

## 2023-02-19 MED ORDER — METHOCARBAMOL 1000 MG/10ML IJ SOLN: 500.0000 mg | Freq: Four times a day (QID) | INTRAVENOUS | Status: DC | PRN

## 2023-02-19 MED ORDER — CEFAZOLIN SODIUM-DEXTROSE 2-4 GM/100ML-% IV SOLN
2.0000 g | Freq: Four times a day (QID) | INTRAVENOUS | Status: AC
  Administered 2023-02-19 (×2): 2 g via INTRAVENOUS
  Filled 2023-02-19 (×2): qty 100

## 2023-02-19 MED ORDER — LACTATED RINGERS IV SOLN: INTRAVENOUS | Status: DC

## 2023-02-19 MED ORDER — GLYCOPYRROLATE 0.2 MG/ML IJ SOLN
INTRAMUSCULAR | Status: AC
  Filled 2023-02-19: qty 2

## 2023-02-19 MED ORDER — PHENOL 1.4 % MT LIQD: 1.0000 | OROMUCOSAL | Status: DC | PRN

## 2023-02-19 MED ORDER — EPHEDRINE SULFATE-NACL 50-0.9 MG/10ML-% IV SOSY
PREFILLED_SYRINGE | INTRAVENOUS | Status: DC | PRN
  Administered 2023-02-19: 5 mg via INTRAVENOUS

## 2023-02-19 MED ORDER — PROPOFOL 10 MG/ML IV BOLUS
INTRAVENOUS | Status: DC | PRN
  Administered 2023-02-19: 20 mg via INTRAVENOUS

## 2023-02-19 MED ORDER — BUPIVACAINE-EPINEPHRINE 0.25% -1:200000 IJ SOLN
INTRAMUSCULAR | Status: AC
  Filled 2023-02-19: qty 1

## 2023-02-19 MED ORDER — STERILE WATER FOR IRRIGATION IR SOLN
Status: DC | PRN
  Administered 2023-02-19: 2000 mL

## 2023-02-19 MED ORDER — SODIUM CHLORIDE 0.9 % IR SOLN
Status: DC | PRN
  Administered 2023-02-19: 3000 mL

## 2023-02-19 MED ORDER — ROPIVACAINE HCL 5 MG/ML IJ SOLN
INTRAMUSCULAR | Status: DC | PRN
Start: 2023-02-19 — End: 2023-02-19
  Administered 2023-02-19: 20 mL via PERINEURAL

## 2023-02-19 MED ORDER — BUPIVACAINE-EPINEPHRINE 0.25% -1:200000 IJ SOLN
INTRAMUSCULAR | Status: DC | PRN
  Administered 2023-02-19: 30 mL

## 2023-02-19 MED ORDER — METOCLOPRAMIDE HCL 5 MG PO TABS: 5.0000 mg | ORAL_TABLET | Freq: Three times a day (TID) | ORAL | Status: DC | PRN

## 2023-02-19 MED ORDER — FENTANYL CITRATE (PF) 100 MCG/2ML IJ SOLN
INTRAMUSCULAR | Status: DC | PRN
  Administered 2023-02-19 (×4): 25 ug via INTRAVENOUS

## 2023-02-19 MED ORDER — ONDANSETRON HCL 4 MG/2ML IJ SOLN
INTRAMUSCULAR | Status: DC | PRN
  Administered 2023-02-19: 4 mg via INTRAVENOUS

## 2023-02-19 SURGICAL SUPPLY — 70 items
ADH SKN CLS APL DERMABOND .7 (GAUZE/BANDAGES/DRESSINGS) ×2
APL PRP STRL LF DISP 70% ISPRP (MISCELLANEOUS) ×2
BAG COUNTER SPONGE SURGICOUNT (BAG) IMPLANT
BAG SPEC THK2 15X12 ZIP CLS (MISCELLANEOUS)
BAG SPNG CNTER NS LX DISP (BAG)
BAG ZIPLOCK 12X15 (MISCELLANEOUS) IMPLANT
BATTERY INSTRU NAVIGATION (MISCELLANEOUS) ×3 IMPLANT
BLADE SAW RECIPROCATING 77.5 (BLADE) ×1 IMPLANT
BNDG CMPR 5X4 KNIT ELC UNQ LF (GAUZE/BANDAGES/DRESSINGS) ×1
BNDG CMPR 6 X 5 YARDS HK CLSR (GAUZE/BANDAGES/DRESSINGS) ×1
BNDG ELASTIC 4INX 5YD STR LF (GAUZE/BANDAGES/DRESSINGS) ×1 IMPLANT
BNDG ELASTIC 6INX 5YD STR LF (GAUZE/BANDAGES/DRESSINGS) ×1 IMPLANT
BTRY SRG DRVR LF (MISCELLANEOUS) ×3
CHLORAPREP W/TINT 26 (MISCELLANEOUS) ×2 IMPLANT
COMP FEM CR PS 12 LT (Joint) ×1 IMPLANT
COMP PATELLA 3 PEG 38 (Joint) ×1 IMPLANT
COMP TIB KNEE H 0D LT (Joint) ×1 IMPLANT
COMPONENT FEM CR PS 12 LT (Joint) IMPLANT
COMPONENT PATELLA 3 PEG 38 (Joint) IMPLANT
COMPONENT TIB KNEE H 0D LT (Joint) IMPLANT
COVER SURGICAL LIGHT HANDLE (MISCELLANEOUS) ×1 IMPLANT
DERMABOND ADVANCED .7 DNX12 (GAUZE/BANDAGES/DRESSINGS) ×2 IMPLANT
DRAPE SHEET LG 3/4 BI-LAMINATE (DRAPES) ×3 IMPLANT
DRAPE U-SHAPE 47X51 STRL (DRAPES) ×1 IMPLANT
DRSG AQUACEL AG ADV 3.5X10 (GAUZE/BANDAGES/DRESSINGS) ×1 IMPLANT
ELECT BLADE TIP CTD 4 INCH (ELECTRODE) ×1 IMPLANT
ELECT REM PT RETURN 15FT ADLT (MISCELLANEOUS) ×1 IMPLANT
GAUZE SPONGE 4X4 12PLY STRL (GAUZE/BANDAGES/DRESSINGS) ×1 IMPLANT
GLOVE BIO SURGEON STRL SZ7 (GLOVE) ×1 IMPLANT
GLOVE BIO SURGEON STRL SZ8.5 (GLOVE) ×2 IMPLANT
GLOVE BIOGEL PI IND STRL 7.5 (GLOVE) ×1 IMPLANT
GLOVE BIOGEL PI IND STRL 8.5 (GLOVE) ×1 IMPLANT
GOWN SPEC L3 XXLG W/TWL (GOWN DISPOSABLE) ×1 IMPLANT
GOWN STRL REUS W/ TWL XL LVL3 (GOWN DISPOSABLE) ×1 IMPLANT
GOWN STRL REUS W/TWL XL LVL3 (GOWN DISPOSABLE) ×1
HANDPIECE INTERPULSE COAX TIP (DISPOSABLE) ×1
HOLDER FOLEY CATH W/STRAP (MISCELLANEOUS) ×1 IMPLANT
HOOD PEEL AWAY T7 (MISCELLANEOUS) ×3 IMPLANT
KIT TURNOVER KIT A (KITS) IMPLANT
LINER TIB ASF PS GH/7-12 10 LT (Liner) IMPLANT
MARKER SKIN DUAL TIP RULER LAB (MISCELLANEOUS) ×1 IMPLANT
NDL SAFETY ECLIP 18X1.5 (MISCELLANEOUS) ×1 IMPLANT
NDL SPNL 18GX3.5 QUINCKE PK (NEEDLE) ×1 IMPLANT
NEEDLE SPNL 18GX3.5 QUINCKE PK (NEEDLE) ×1
NS IRRIG 1000ML POUR BTL (IV SOLUTION) ×1 IMPLANT
PACK TOTAL KNEE CUSTOM (KITS) ×1 IMPLANT
PADDING CAST COTTON 6X4 STRL (CAST SUPPLIES) ×1 IMPLANT
PIN DRILL HDLS TROCAR 75 4PK (PIN) IMPLANT
PROTECTOR NERVE ULNAR (MISCELLANEOUS) ×1 IMPLANT
SAW OSC TIP CART 19.5X105X1.3 (SAW) ×1 IMPLANT
SCREW FEMALE HEX FIX 25X2.5 (ORTHOPEDIC DISPOSABLE SUPPLIES) IMPLANT
SEALER BIPOLAR AQUA 6.0 (INSTRUMENTS) ×1 IMPLANT
SET HNDPC FAN SPRY TIP SCT (DISPOSABLE) ×1 IMPLANT
SET PAD KNEE POSITIONER (MISCELLANEOUS) ×1 IMPLANT
SOLUTION PRONTOSAN WOUND 350ML (IRRIGATION / IRRIGATOR) IMPLANT
SPIKE FLUID TRANSFER (MISCELLANEOUS) ×2 IMPLANT
SUT MNCRL AB 3-0 PS2 18 (SUTURE) ×1 IMPLANT
SUT MON AB 2-0 CT1 36 (SUTURE) ×1 IMPLANT
SUT STRATAFIX PDO 1 14 VIOLET (SUTURE) ×1
SUT STRATFX PDO 1 14 VIOLET (SUTURE) ×1
SUT VIC AB 1 CTX 36 (SUTURE) ×2
SUT VIC AB 1 CTX36XBRD ANBCTR (SUTURE) ×2 IMPLANT
SUT VIC AB 2-0 CT1 27 (SUTURE) ×1
SUT VIC AB 2-0 CT1 TAPERPNT 27 (SUTURE) ×1 IMPLANT
SUTURE STRATFX PDO 1 14 VIOLET (SUTURE) ×1 IMPLANT
SYR 3ML LL SCALE MARK (SYRINGE) ×1 IMPLANT
TRAY FOLEY MTR SLVR 16FR STAT (SET/KITS/TRAYS/PACK) IMPLANT
TUBE SUCTION HIGH CAP CLEAR NV (SUCTIONS) ×1 IMPLANT
WATER STERILE IRR 1000ML POUR (IV SOLUTION) ×2 IMPLANT
WRAP KNEE MAXI GEL POST OP (GAUZE/BANDAGES/DRESSINGS) IMPLANT

## 2023-02-19 NOTE — Evaluation (Signed)
Physical Therapy Evaluation Patient Details Name: Chad Guerra MRN: [id number] DOB: 1950-09-12 Today's Date: 02/19/2023  History of Present Illness  Pt s/p L TKR and with hx of cervical fusion, htn, and PAF  Clinical Impression  Pt s/p L TKR and presents with decreased L LE strength/ROM and post op pain limiting functional mobility.  Pt should progress to dc home with family assist and reports first OP PT scheduled for 02/23/23        If plan is discharge home, recommend the following: A little help with walking and/or transfers;A little help with bathing/dressing/bathroom;Assistance with cooking/housework;Assist for transportation;Help with stairs or ramp for entrance   Can travel by private vehicle        Equipment Recommendations Rolling walker (2 wheels)  Recommendations for Other Services       Functional Status Assessment Patient has had a recent decline in their functional status and demonstrates the ability to make significant improvements in function in a reasonable and predictable amount of time.     Precautions / Restrictions Precautions Precautions: Knee;Fall Restrictions Weight Bearing Restrictions: No Other Position/Activity Restrictions: WBAT      Mobility  Bed Mobility Overal bed mobility: Needs Assistance Bed Mobility: Supine to Sit     Supine to sit: Min assist     General bed mobility comments: Increased time with min assist for L LE    Transfers Overall transfer level: Needs assistance Equipment used: Rolling walker (2 wheels) Transfers: Sit to/from Stand Sit to Stand: Min assist           General transfer comment: cues for LE management and use of UEs to self assist    Ambulation/Gait Ambulation/Gait assistance: Min assist Gait Distance (Feet): 45 Feet Assistive device: Rolling walker (2 wheels) Gait Pattern/deviations: Step-to pattern, Step-through pattern, Decreased step length - right, Decreased step length - left, Shuffle,  Trunk flexed Gait velocity: decr     General Gait Details: cues for sequence, posture and position from [employer]            Wheelchair Mobility     Tilt Bed    Modified Rankin (Stroke Patients Only)       Balance Overall balance assessment: Needs assistance Sitting-balance support: No upper extremity supported, Feet supported Sitting balance-Leahy Scale: Good     Standing balance support: Bilateral upper extremity supported Standing balance-Leahy Scale: Poor                               Pertinent Vitals/Pain Pain Assessment Pain Assessment: 0-10 Pain Score: 4  Pain Location: L knee Pain Descriptors / Indicators: Aching, Sore Pain Intervention(s): Limited activity within patient's tolerance, Monitored during session, Premedicated before session, Ice applied    Home Living Family/patient expects to be discharged to:: Private residence Living Arrangements: Spouse/significant other Available Help at Discharge: Family;Available 24 hours/day Type of Home: House Home Access: Stairs to enter Entrance Stairs-Rails: None Entrance Stairs-Number of Steps: 1   Home Layout: One level Home Equipment: Cane - single point      Prior Function Prior Level of Function : Independent/Modified Independent                     Extremity/Trunk Assessment   Upper Extremity Assessment Upper Extremity Assessment: Overall WFL for tasks assessed    Lower Extremity Assessment Lower Extremity Assessment: LLE deficits/detail    Cervical / Trunk Assessment Cervical / Trunk Assessment: Normal  Communication   Communication Communication: No apparent difficulties  Cognition Arousal: Alert Behavior During Therapy: WFL for tasks assessed/performed Overall Cognitive Status: Within Functional Limits for tasks assessed                                          General Comments      Exercises Total Joint Exercises Ankle Circles/Pumps:  AROM, Both, 15 reps, Supine   Assessment/Plan    PT Assessment Patient needs continued PT services  PT Problem List Decreased strength;Decreased range of motion;Decreased activity tolerance;Decreased balance;Decreased mobility;Decreased knowledge of use of DME;Pain       PT Treatment Interventions DME instruction;Gait training;Stair training;Functional mobility training;Therapeutic activities;Therapeutic exercise;Patient/family education    PT Goals (Current goals can be found in the Care Plan section)  Acute Rehab PT Goals Patient Stated Goal: Regain IND PT Goal Formulation: With patient Time For Goal Achievement: 02/26/23 Potential to Achieve Goals: Good    Frequency 7X/week     Co-evaluation               AM-PAC PT "6 Clicks" Mobility  Outcome Measure Help needed turning from your back to your side while in a flat bed without using bedrails?: A Little Help needed moving from lying on your back to sitting on the side of a flat bed without using bedrails?: A Little Help needed moving to and from a bed to a chair (including a wheelchair)?: A Little Help needed standing up from a chair using your arms (e.g., wheelchair or bedside chair)?: A Lot Help needed to walk in hospital room?: A Lot Help needed climbing 3-5 steps with a railing? : A Lot 6 Click Score: 15    End of Session Equipment Utilized During Treatment: Gait belt Activity Tolerance: Patient tolerated treatment well Patient left: in chair;with call bell/phone within reach;with chair alarm set;with family/visitor present Nurse Communication: Mobility status PT Visit Diagnosis: Unsteadiness on feet (R26.81);Difficulty in walking, not elsewhere classified (R26.2)    Time: 6045-4098 PT Time Calculation (min) (ACUTE ONLY): 19 min   Charges:   PT Evaluation $PT Eval Low Complexity: 1 Low   PT General Charges $$ ACUTE PT VISIT: 1 Visit         Mauro Kaufmann PT Acute Rehabilitation Services Pager  [phone] Office [phone]   [facility] 02/19/2023, 4:43 PM

## 2023-02-19 NOTE — Anesthesia Procedure Notes (Signed)
Spinal  Patient location during procedure: OR Start time: 02/19/2023 10:35 AM End time: 02/19/2023 10:40 AM Reason for block: surgical anesthesia Staffing Performed: anesthesiologist  Anesthesiologist: Marcene Duos, MD Performed by: Marcene Duos, MD Authorized by: Marcene Duos, MD   Preanesthetic Checklist Completed: patient identified, IV checked, site marked, risks and benefits discussed, surgical consent, monitors and equipment checked, pre-op evaluation and timeout performed Spinal Block Patient position: sitting Prep: DuraPrep Patient monitoring: heart rate, cardiac monitor, continuous pulse ox and blood pressure Approach: midline Location: L4-5 Injection technique: single-shot Needle Needle type: Pencan  Needle gauge: 24 G Needle length: 9 cm Assessment Sensory level: T4 Events: CSF return

## 2023-02-19 NOTE — Anesthesia Procedure Notes (Signed)
Anesthesia Regional Block: Adductor canal block   Pre-Anesthetic Checklist: , timeout performed,  Correct Patient, Correct Site, Correct Laterality,  Correct Procedure, Correct Position, site marked,  Risks and benefits discussed,  Surgical consent,  Pre-op evaluation,  At surgeon's request and post-op pain management  Laterality: Left  Prep: chloraprep       Needles:  Injection technique: Single-shot  Needle Type: Echogenic Needle     Needle Length: 9cm  Needle Gauge: 21     Additional Needles:   Procedures:,,,, ultrasound used (permanent image in chart),,    Narrative:  Start time: 02/19/2023 9:30 AM End time: 02/19/2023 9:35 AM Injection made incrementally with aspirations every 5 mL.  Performed by: Personally  Anesthesiologist: Marcene Duos, MD

## 2023-02-19 NOTE — Plan of Care (Signed)
Problem: Education: Goal: Knowledge of the prescribed therapeutic regimen will improve Outcome: Progressing   Problem: Pain Management: Goal: Pain level will decrease with appropriate interventions Outcome: Progressing   Problem: Activity: Goal: Ability to avoid complications of mobility impairment will improve Outcome: Progressing

## 2023-02-19 NOTE — Interval H&P Note (Signed)
History and Physical Interval Note:  02/19/2023 9:28 AM  Chad Guerra  has presented today for surgery, with the diagnosis of Left knee osteoathrtitis.  The various methods of treatment have been discussed with the patient and family. After consideration of risks, benefits and other options for treatment, the patient has consented to  Procedure(s) with comments: COMPUTER ASSISTED TOTAL KNEE ARTHROPLASTY (Left) - 160 as a surgical intervention.  The patient's history has been reviewed, patient examined, no change in status, stable for surgery.  I have reviewed the patient's chart and labs.  Questions were answered to the patient's satisfaction.     Chad Guerra

## 2023-02-19 NOTE — Discharge Instructions (Signed)

## 2023-02-19 NOTE — Transfer of Care (Signed)
Immediate Anesthesia Transfer of Care Note  Patient: Chad Guerra  Procedure(s) Performed: Procedure(s) with comments: COMPUTER ASSISTED TOTAL KNEE ARTHROPLASTY (Left) - 160  Patient Location: PACU  Anesthesia Type:Spinal  Level of Consciousness:  sedated, patient cooperative and responds to stimulation  Airway & Oxygen Therapy:Patient Spontanous Breathing and Patient connected to face mask oxgen  Post-op Assessment:  Report given to PACU RN and Post -op Vital signs reviewed and stable  Post vital signs:  Reviewed and stable  Last Vitals:  Vitals:   02/19/23 0935 02/19/23 0940  BP: (!) 149/83 (!) 154/91  Pulse: (!) 53 (!) 48  Resp: 15 16  Temp:    SpO2: 99% 98%    Complications: No apparent anesthesia complications

## 2023-02-19 NOTE — Plan of Care (Signed)
  Problem: Education: Goal: Knowledge of the prescribed therapeutic regimen will improve Outcome: Progressing Goal: Individualized Educational Video(s) Outcome: Completed/Met   Problem: Activity: Goal: Ability to avoid complications of mobility impairment will improve Outcome: Progressing Goal: Range of joint motion will improve Outcome: Adequate for Discharge   Problem: Clinical Measurements: Goal: Postoperative complications will be avoided or minimized Outcome: Progressing   Problem: Pain Management: Goal: Pain level will decrease with appropriate interventions Outcome: Progressing   Problem: Skin Integrity: Goal: Will show signs of wound healing Outcome: Progressing   Problem: Education: Goal: Knowledge of General Education information will improve Description: Including pain rating scale, medication(s)/side effects and non-pharmacologic comfort measures Outcome: Progressing   Problem: Health Behavior/Discharge Planning: Goal: Ability to manage health-related needs will improve Outcome: Progressing   Problem: Clinical Measurements: Goal: Ability to maintain clinical measurements within normal limits will improve Outcome: Progressing Goal: Will remain free from infection Outcome: Progressing Goal: Diagnostic test results will improve Outcome: Progressing Goal: Respiratory complications will improve Outcome: Adequate for Discharge Goal: Cardiovascular complication will be avoided Outcome: Progressing   Problem: Clinical Measurements: Goal: Ability to maintain clinical measurements within normal limits will improve Outcome: Progressing Goal: Will remain free from infection Outcome: Progressing Goal: Diagnostic test results will improve Outcome: Progressing Goal: Respiratory complications will improve Outcome: Adequate for Discharge Goal: Cardiovascular complication will be avoided Outcome: Progressing   Problem: Activity: Goal: Risk for activity intolerance  will decrease Outcome: Adequate for Discharge   Problem: Nutrition: Goal: Adequate nutrition will be maintained Outcome: Completed/Met   Problem: Coping: Goal: Level of anxiety will decrease Outcome: Progressing   Problem: Elimination: Goal: Will not experience complications related to bowel motility Outcome: Progressing Goal: Will not experience complications related to urinary retention Outcome: Progressing   Problem: Pain Managment: Goal: General experience of comfort will improve Outcome: Progressing   Problem: Safety: Goal: Ability to remain free from injury will improve Outcome: Progressing   Problem: Skin Integrity: Goal: Risk for impaired skin integrity will decrease Outcome: Progressing

## 2023-02-19 NOTE — Op Note (Signed)
OPERATIVE REPORT  SURGEON: Samson Frederic, MD   ASSISTANT: Clint Bolder, PA-C  PREOPERATIVE DIAGNOSIS: Primary Left knee arthritis.   POSTOPERATIVE DIAGNOSIS: Primary Left knee arthritis.   PROCEDURE: Computer assisted Left total knee arthroplasty.   IMPLANTS: Zimmer Persona PPS Cementless CR femur, size 12. Persona 0 degree Spiked Keel OsseoTi Tibia, size H. Vivacit-E polyethelyene insert, size 10 mm, CR. OsseoTi 3-Peg patella, size 38 mm.  ANESTHESIA:  Spinal and MAC combined with regional for post-op pain  TOURNIQUET TIME: Not utilized.   ESTIMATED BLOOD LOSS:-200 mL    ANTIBIOTICS: 2g Ancef.  DRAINS: None.  COMPLICATIONS: None   CONDITION: PACU - hemodynamically stable.   BRIEF CLINICAL NOTE: Chad Guerra is a 72 y.o. male with a long-standing history of Left knee arthritis. After failing conservative management, the patient was indicated for total knee arthroplasty. The risks, benefits, and alternatives to the procedure were explained, and the patient elected to proceed.  PROCEDURE IN DETAIL: Adductor canal block was obtained in the pre-op holding area. Once inside the operative room, spinal anesthesia was obtained, and a foley catheter was inserted. The patient was then positioned and the lower extremity was prepped and draped in the normal sterile surgical fashion.  A time-out was called verifying side and site of surgery. The patient received IV antibiotics within 60 minutes of beginning the procedure. A tourniquet was not utilized.   An anterior approach to the knee was performed utilizing a midvastus arthrotomy. A medial release was performed and the patellar fat pad was excised. Stryker imageless navigation was used to cut the distal femur perpendicular to the mechanical axis. A freehand patellar resection was performed, and the patella was sized an prepared with 3 lug holes.  Nagivation was used to make a neutral proximal tibia resection, taking 9 mm of bone from  the less affected lateral side with 3 degrees of slope. The menisci were excised. A spacer block was placed, and the alignment and balance in extension were confirmed.   The distal femur was sized using the 3-degree external rotation guide referencing the posterior femoral cortex. The appropriate 4-in-1 cutting block was pinned into place. Rotation was checked using Whiteside's line, the epicondylar axis, and then confirmed with a spacer block in flexion. The remaining femoral cuts were performed, taking care to protect the MCL.  The tibia was sized and the trial tray was pinned into place. The remaining trail components were inserted. The knee was stable to varus and valgus stress through a full range of motion. The patella tracked centrally, and the PCL was well balanced. The trial components were removed, and the proximal tibial surface was prepared. Final components were impacted into place. The knee was tested for a final time and found to be well balanced.   The wound was copiously irrigated with Prontosan solution and normal saline using pulse lavage.  Marcaine solution was injected into the periarticular soft tissue.  The wound was closed in layers using #1 Vicryl and Stratafix for the fascia, 2-0 Vicryl for the subcutaneous fat, 2-0 Monocryl for the deep dermal layer, 3-0 running Monocryl subcuticular Stitch, and 4-0 Monocryl stay sutures at both ends of the wound. Dermabond was applied to the skin.  Once the glue was fully dried, an Aquacell Ag and compressive dressing were applied.  The patient was transported to the recovery room in stable condition.  Sponge, needle, and instrument counts were correct at the end of the case x2.  The patient tolerated the procedure well and  there were no known complications.  The aquamantis was utilized for this case to help facilitate better hemostasis as patient was felt to be at increased risk of bleeding because of complex case requiring increased OR time  and/or exposure.  -minimally invasive approach.  A oscillating saw tip was utilized for this case to prevent damage to the soft tissue structures such as muscles, ligaments and tendons, and to ensure accurate bone cuts. This patient was at increased risk for above structures due to  minimally invasive approach.  Please note that a surgical assistant was a medical necessity for this procedure in order to perform it in a safe and expeditious manner. Surgical assistant was necessary to retract the ligaments and vital neurovascular structures to prevent injury to them and also necessary for proper positioning of the limb to allow for anatomic placement of the prosthesis.

## 2023-02-20 ENCOUNTER — Other Ambulatory Visit: Payer: Self-pay

## 2023-02-20 ENCOUNTER — Emergency Department (HOSPITAL_COMMUNITY)
Admission: EM | Admit: 2023-02-20 | Discharge: 2023-02-21 | Disposition: A | Discharge status: Home / Self Care | Payer: No Typology Code available for payment source | Attending: Emergency Medicine | Admitting: Emergency Medicine

## 2023-02-20 ENCOUNTER — Encounter (HOSPITAL_COMMUNITY): Payer: Self-pay

## 2023-02-20 DIAGNOSIS — G8918 Other acute postprocedural pain: Secondary | ICD-10-CM | POA: Insufficient documentation

## 2023-02-20 DIAGNOSIS — M25562 Pain in left knee: Secondary | ICD-10-CM | POA: Diagnosis present

## 2023-02-20 DIAGNOSIS — I10 Essential (primary) hypertension: Secondary | ICD-10-CM | POA: Insufficient documentation

## 2023-02-20 DIAGNOSIS — Z79899 Other long term (current) drug therapy: Secondary | ICD-10-CM | POA: Diagnosis not present

## 2023-02-20 DIAGNOSIS — Z7901 Long term (current) use of anticoagulants: Secondary | ICD-10-CM | POA: Insufficient documentation

## 2023-02-20 DIAGNOSIS — M1712 Unilateral primary osteoarthritis, left knee: Secondary | ICD-10-CM | POA: Diagnosis not present

## 2023-02-20 DIAGNOSIS — Z96652 Presence of left artificial knee joint: Secondary | ICD-10-CM | POA: Diagnosis not present

## 2023-02-20 LAB — CBC
HCT: 37.2 % — ABNORMAL LOW (ref 39.0–52.0)
Hemoglobin: 12.3 g/dL — ABNORMAL LOW (ref 13.0–17.0)
MCH: 30.9 pg (ref 26.0–34.0)
MCHC: 33.1 g/dL (ref 30.0–36.0)
MCV: 93.5 fL (ref 80.0–100.0)
Platelets: 177 10*3/uL (ref 150–400)
RBC: 3.98 MIL/uL — ABNORMAL LOW (ref 4.22–5.81)
RDW: 11.8 % (ref 11.5–15.5)
WBC: 7.4 10*3/uL (ref 4.0–10.5)
nRBC: 0 % (ref 0.0–0.2)

## 2023-02-20 LAB — BASIC METABOLIC PANEL
Anion gap: 7 (ref 5–15)
BUN: 16 mg/dL (ref 8–23)
CO2: 24 mmol/L (ref 22–32)
Calcium: 8.1 mg/dL — ABNORMAL LOW (ref 8.9–10.3)
Chloride: 104 mmol/L (ref 98–111)
Creatinine, Ser: 1.16 mg/dL (ref 0.61–1.24)
GFR, Estimated: >60 mL/min (ref >60)
Glucose, Bld: 138 mg/dL — ABNORMAL HIGH (ref 70–99)
Potassium: 3.7 mmol/L (ref 3.5–5.1)
Sodium: 135 mmol/L (ref 135–145)

## 2023-02-20 MED ORDER — POLYETHYLENE GLYCOL 3350 17 G PO PACK: 17.0000 g | PACK | Freq: Every day | ORAL | 0 refills | Status: AC | PRN

## 2023-02-20 MED ORDER — HYDROMORPHONE HCL 2 MG PO TABS: 1.0000 mg | ORAL_TABLET | ORAL | 0 refills | Status: AC | PRN

## 2023-02-20 MED ORDER — KETOROLAC TROMETHAMINE 15 MG/ML IJ SOLN
15.0000 mg | Freq: Once | INTRAMUSCULAR | Status: AC
  Administered 2023-02-21: 15 mg via INTRAVENOUS
  Filled 2023-02-20: qty 1

## 2023-02-20 MED ORDER — HYDROMORPHONE HCL 2 MG PO TABS: 1.0000 mg | ORAL_TABLET | ORAL | Status: DC | PRN

## 2023-02-20 MED ORDER — ONDANSETRON HCL 4 MG PO TABS: 4.0000 mg | ORAL_TABLET | Freq: Three times a day (TID) | ORAL | 0 refills | Status: AC | PRN

## 2023-02-20 MED ORDER — HYDROMORPHONE HCL 1 MG/ML IJ SOLN
1.0000 mg | Freq: Once | INTRAMUSCULAR | Status: AC
  Administered 2023-02-21: 1 mg via INTRAVENOUS
  Filled 2023-02-20: qty 1

## 2023-02-20 MED ORDER — DOCUSATE SODIUM 100 MG PO CAPS: 100.0000 mg | ORAL_CAPSULE | Freq: Two times a day (BID) | ORAL | 0 refills | Status: AC

## 2023-02-20 MED ORDER — ACETAMINOPHEN 500 MG PO TABS: 1000.0000 mg | ORAL_TABLET | Freq: Three times a day (TID) | ORAL | 0 refills | Status: DC | PRN

## 2023-02-20 MED ORDER — HYDROMORPHONE HCL 2 MG PO TABS
2.0000 mg | ORAL_TABLET | ORAL | Status: DC | PRN
  Administered 2023-02-20: 2 mg via ORAL
  Filled 2023-02-20: qty 1

## 2023-02-20 NOTE — ED Triage Notes (Signed)
Pt has pain to his left knee after having a total knee replacement yesterday. Pt states that the pain is uncontrolled.

## 2023-02-20 NOTE — Progress Notes (Signed)
Physical Therapy Treatment Patient Details Name: Chad Guerra MRN: [id number] DOB: [birth year] Today's Date: 02/20/2023   History of Present Illness Pt s/p L TKR and with hx of cervical fusion, htn, and PAF    PT Comments  Pt continues motivated and progressing well with mobility.  Pt up to ambulate increased distance in hall, negotiated stairs and reviewed written HEP.  Multiple questions asked and answered.  Pt eager for dc home this date.    If plan is discharge home, recommend the following: A little help with walking and/or transfers;A little help with bathing/dressing/bathroom;Assistance with cooking/housework;Assist for transportation;Help with stairs or ramp for entrance   Can travel by private vehicle        Equipment Recommendations  Rolling walker (2 wheels)    Recommendations for Other Services       Precautions / Restrictions Precautions Precautions: Knee;Fall Restrictions Weight Bearing Restrictions: No LLE Weight Bearing: Weight bearing as tolerated Other Position/Activity Restrictions: WBAT     Mobility  Bed Mobility Overal bed mobility: Needs Assistance Bed Mobility: Supine to Sit, Sit to Supine     Supine to sit: Supervision Sit to supine: Supervision   General bed mobility comments: Increased time with cues for sequence and use of gait belt to self assist    Transfers Overall transfer level: Needs assistance Equipment used: Rolling walker (2 wheels) Transfers: Sit to/from Stand Sit to Stand: Contact guard assist, Supervision           General transfer comment: cues for LE management and use of UEs to self assist    Ambulation/Gait Ambulation/Gait assistance: Contact guard assist, Supervision Gait Distance (Feet): 100 Feet Assistive device: Rolling walker (2 wheels) Gait Pattern/deviations: Step-to pattern, Step-through pattern, Decreased step length - right, Decreased step length - left, Shuffle, Trunk flexed Gait velocity: decr      General Gait Details: cues for sequence, posture and position from RW   Stairs Stairs: Yes Stairs assistance: Min assist Stair Management: No rails, Step to pattern, Backwards, Forwards, With walker Number of Stairs: 4 General stair comments: single step x 4  - twice fwd and twice bkwd   Wheelchair Mobility     Tilt Bed    Modified Rankin (Stroke Patients Only)       Balance Overall balance assessment: Needs assistance Sitting-balance support: No upper extremity supported, Feet supported Sitting balance-Leahy Scale: Good     Standing balance support: No upper extremity supported Standing balance-Leahy Scale: Fair                              Cognition Arousal: Alert Behavior During Therapy: WFL for tasks assessed/performed Overall Cognitive Status: Within Functional Limits for tasks assessed                                          Exercises Total Joint Exercises Ankle Circles/Pumps: AROM, Both, 15 reps, Supine Quad Sets: AROM, Both, 10 reps, Supine Heel Slides: AAROM, Left, 15 reps, Supine Straight Leg Raises: AAROM, AROM, Left, 15 reps, Supine Long Arc Quad: AAROM, Left, 10 reps, Seated Goniometric ROM: -5 - 45 AAROM L knee    General Comments        Pertinent Vitals/Pain Pain Assessment Pain Assessment: 0-10 Pain Score: 7  Pain Location: L knee Pain Descriptors / Indicators: Aching, Sore Pain Intervention(s): Limited activity within  patient's tolerance, Monitored during session, Premedicated before session, Ice applied    Home Living                          Prior Function            PT Goals (current goals can now be found in the care plan section) Acute Rehab PT Goals Patient Stated Goal: Regain IND PT Goal Formulation: With patient Time For Goal Achievement: 02/26/23 Potential to Achieve Goals: Good Progress towards PT goals: Progressing toward goals    Frequency    7X/week      PT Plan       Co-evaluation              AM-PAC PT "6 Clicks" Mobility   Outcome Measure  Help needed turning from your back to your side while in a flat bed without using bedrails?: A Little Help needed moving from lying on your back to sitting on the side of a flat bed without using bedrails?: A Little Help needed moving to and from a bed to a chair (including a wheelchair)?: A Little Help needed standing up from a chair using your arms (e.g., wheelchair or bedside chair)?: A Little Help needed to walk in hospital room?: A Little Help needed climbing 3-5 steps with a railing? : A Little 6 Click Score: 18    End of Session Equipment Utilized During Treatment: Gait belt Activity Tolerance: Patient tolerated treatment well Patient left: in bed;with call bell/phone within reach;with family/visitor present Nurse Communication: Mobility status PT Visit Diagnosis: Unsteadiness on feet (R26.81);Difficulty in walking, not elsewhere classified (R26.2)     Time: 4401-0272 PT Time Calculation (min) (ACUTE ONLY): 24 min  Charges:    $Gait Training: 8-22 mins $Therapeutic Activity: 8-22 mins PT General Charges $$ ACUTE PT VISIT: 1 Visit                     Mauro Kaufmann PT Acute Rehabilitation Services Pager [phone] Office [phone]    [facility] 02/20/2023, 12:31 PM

## 2023-02-20 NOTE — Anesthesia Postprocedure Evaluation (Signed)
Anesthesia Post Note  Patient: Chad Guerra  Procedure(s) Performed: COMPUTER ASSISTED TOTAL KNEE ARTHROPLASTY (Left: Knee)     Patient location during evaluation: PACU Anesthesia Type: Spinal Level of consciousness: awake and alert Pain management: pain level controlled Vital Signs Assessment: post-procedure vital signs reviewed and stable Respiratory status: spontaneous breathing and respiratory function stable Cardiovascular status: blood pressure returned to baseline and stable Postop Assessment: spinal receding Anesthetic complications: no   No notable events documented.  Last Vitals:  Vitals:   02/20/23 0601 02/20/23 0957  BP: 137/76 (!) 146/85  Pulse: (!) 51 (!) 56  Resp: 17 18  Temp: 36.5 C 36.6 C  SpO2: 98% 97%    Last Pain:  Vitals:   02/20/23 1024  TempSrc:   PainSc: 6                  Kennieth Rad

## 2023-02-20 NOTE — Progress Notes (Signed)
    Subjective:  Patient reports pain as moderate.  Denies N/V/CP/SOB/Abd pain. He reports that he struggled with pain control overnight. He denies any tingling or numbness in LE bilaterally.   Wife is at bedside. Discussed pain management options. Patient was unable to get walker. Order placed. Patient and wife are aware to take rx for ice machine to the [place].   Objective:   VITALS:   Vitals:   02/19/23 2200 02/20/23 0125 02/20/23 0601 02/20/23 0957  BP: 130/79 126/72 137/76 (!) 146/85  Pulse: (!) 55 (!) 55 (!) 51 (!) 56  Resp: 18 17 17 18   Temp: 97.6 F (36.4 C) 98.1 F (36.7 C) 97.7 F (36.5 C) 97.8 F (36.6 C)  TempSrc: Oral Oral Oral   SpO2: 97% 95% 98% 97%  Weight:        Patient lying in bed. NAD.  Neurologically intact ABD soft Neurovascular intact Sensation intact distally Intact pulses distally Dorsiflexion/Plantar flexion intact Incision: dressing C/D/I No cellulitis present Compartment soft   Lab Results  Component Value Date   WBC 7.4 02/20/2023   HGB 12.3 (L) 02/20/2023   HCT 37.2 (L) 02/20/2023   MCV 93.5 02/20/2023   PLT 177 02/20/2023   BMET    Component Value Date/Time   NA 135 02/20/2023 0304   K 3.7 02/20/2023 0304   CL 104 02/20/2023 0304   CO2 24 02/20/2023 0304   GLUCOSE 138 (H) 02/20/2023 0304   BUN 16 02/20/2023 0304   CREATININE 1.16 02/20/2023 0304   CALCIUM 8.1 (L) 02/20/2023 0304   GFRNONAA >60 02/20/2023 0304     Assessment/Plan: 1 Day Post-Op   Principal Problem:   Degenerative joint disease of left knee Active Problems:   S/P total knee arthroplasty, left    WBAT with walker DVT ppx:  Eliquis , SCDs, TEDS PO pain control: Poor pain control with hydrocodone, changed to oral dilaudid.  PT/OT: Patient ambulated 45 feet with PT yesterday.  Dispo:  - D/c home once voided, cleared with PT and pain control.    Clois Dupes, PA-C 02/20/2023, 10:08 AM   [facility]  [place] [address], [address],  [place], [place] [address] Phone: [phone] [email or web address] Facebook  [employer]

## 2023-02-20 NOTE — TOC Transition Note (Signed)
Transition of Care [facility]) - CM/SW Discharge Note  Patient Details  Name: Chad Guerra MRN: [id number] Date of Birth: 10-06-1950  Transition of Care [facility]) CM/SW Contact:  Ewing Schlein, LCSW Phone Number: 02/20/2023, 10:20 AM  Clinical Narrative: Patient is expected to discharge home after working with PT. CSW met with patient and spouse to review discharge plan and needs. Patient will go home with OPPT at [facility] with the first appointment scheduled on 02/23/23. Patient will need a rolling walker as he did not pick one up through the [place]. [employer] delivered rolling walker to room. TOC signing off.  Final next level of care: OP Rehab Barriers to Discharge: No Barriers Identified  Patient Goals and CMS Choice CMS Medicare.gov Compare Post Acute Care list provided to:: Patient Choice offered to / list presented to : Patient  Discharge Plan and Services Additional resources added to the After Visit Summary for           DME Arranged: Walker rolling DME Agency: [employer] Date DME Agency Contacted: 02/20/23 Representative spoke with at DME Agency: Loraine Leriche  Social Determinants of Health (SDOH) Interventions SDOH Screenings   Housing: Patient Declined (02/20/2023)  Tobacco Use: Low Risk  (02/19/2023)   Readmission Risk Interventions     No data to display

## 2023-02-20 NOTE — Progress Notes (Signed)
Physical Therapy Treatment Patient Details Name: Chad Guerra MRN: [id number] DOB: [birth year] Today's Date: 02/20/2023   History of Present Illness Pt s/p L TKR and with hx of cervical fusion, htn, and PAF    PT Comments  Pt in good spirits and progressing well with mobility.  HEP initiated and pt up to ambulate increased distance in hall with marked improvement in stability.  Pt should dc home this date.    If plan is discharge home, recommend the following: A little help with walking and/or transfers;A little help with bathing/dressing/bathroom;Assistance with cooking/housework;Assist for transportation;Help with stairs or ramp for entrance   Can travel by private vehicle        Equipment Recommendations  Rolling walker (2 wheels)    Recommendations for Other Services       Precautions / Restrictions Precautions Precautions: Knee;Fall Restrictions Weight Bearing Restrictions: No LLE Weight Bearing: Weight bearing as tolerated Other Position/Activity Restrictions: WBAT     Mobility  Bed Mobility Overal bed mobility: Needs Assistance Bed Mobility: Supine to Sit     Supine to sit: Contact guard, Supervision     General bed mobility comments: Increased time with cues for sequence and use of gait belt to self assist    Transfers Overall transfer level: Needs assistance Equipment used: Rolling walker (2 wheels) Transfers: Sit to/from Stand Sit to Stand: Contact guard assist           General transfer comment: cues for LE management and use of UEs to self assist    Ambulation/Gait Ambulation/Gait assistance: Contact guard assist Gait Distance (Feet): 100 Feet Assistive device: Rolling walker (2 wheels) Gait Pattern/deviations: Step-to pattern, Step-through pattern, Decreased step length - right, Decreased step length - left, Shuffle, Trunk flexed Gait velocity: decr     General Gait Details: cues for sequence, posture and position from [employer]              Wheelchair Mobility     Tilt Bed    Modified Rankin (Stroke Patients Only)       Balance Overall balance assessment: Needs assistance Sitting-balance support: No upper extremity supported, Feet supported Sitting balance-Leahy Scale: Good     Standing balance support: Single extremity supported Standing balance-Leahy Scale: Poor                              Cognition Arousal: Alert Behavior During Therapy: WFL for tasks assessed/performed Overall Cognitive Status: Within Functional Limits for tasks assessed                                          Exercises Total Joint Exercises Ankle Circles/Pumps: AROM, Both, 15 reps, Supine Quad Sets: AROM, Both, 10 reps, Supine Heel Slides: AAROM, Left, 15 reps, Supine Straight Leg Raises: AAROM, AROM, Left, 15 reps, Supine Long Arc Quad: AAROM, Left, 10 reps, Seated Goniometric ROM: -5 - 45 AAROM L knee    General Comments        Pertinent Vitals/Pain Pain Assessment Pain Assessment: 0-10 Pain Score: 7  Pain Location: L knee Pain Descriptors / Indicators: Aching, Sore Pain Intervention(s): Limited activity within patient's tolerance, Monitored during session, Premedicated before session, Ice applied    Home Living  Prior Function            PT Goals (current goals can now be found in the care plan section) Acute Rehab PT Goals Patient Stated Goal: Regain IND PT Goal Formulation: With patient Time For Goal Achievement: 02/26/23 Potential to Achieve Goals: Good Progress towards PT goals: Progressing toward goals    Frequency    7X/week      PT Plan      Co-evaluation              AM-PAC PT "6 Clicks" Mobility   Outcome Measure  Help needed turning from your back to your side while in a flat bed without using bedrails?: A Little Help needed moving from lying on your back to sitting on the side of a flat bed without  using bedrails?: A Little Help needed moving to and from a bed to a chair (including a wheelchair)?: A Little Help needed standing up from a chair using your arms (e.g., wheelchair or bedside chair)?: A Little Help needed to walk in hospital room?: A Little Help needed climbing 3-5 steps with a railing? : A Lot 6 Click Score: 17    End of Session Equipment Utilized During Treatment: Gait belt Activity Tolerance: Patient tolerated treatment well Patient left: in chair;with call bell/phone within reach;with chair alarm set;with family/visitor present Nurse Communication: Mobility status PT Visit Diagnosis: Unsteadiness on feet (R26.81);Difficulty in walking, not elsewhere classified (R26.2)     Time: 4098-1191 PT Time Calculation (min) (ACUTE ONLY): 35 min  Charges:    $Gait Training: 8-22 mins $Therapeutic Exercise: 8-22 mins PT General Charges $$ ACUTE PT VISIT: 1 Visit                     Mauro Kaufmann PT Acute Rehabilitation Services Pager [phone] Office [phone]    Citlali Gautney 02/20/2023, 8:58 AM

## 2023-02-20 NOTE — Progress Notes (Signed)
Patient discharged to home w/ family. Given all belongings, instructions, equipment. Verbalized understanding of instructions. Escorted to pov via w/c. 

## 2023-02-21 ENCOUNTER — Encounter (HOSPITAL_COMMUNITY): Payer: Self-pay | Admitting: Orthopedic Surgery

## 2023-02-21 MED ORDER — METHOCARBAMOL 500 MG PO TABS: 500.0000 mg | ORAL_TABLET | Freq: Two times a day (BID) | ORAL | 0 refills | Status: DC

## 2023-02-21 MED ORDER — METHOCARBAMOL 500 MG PO TABS
500.0000 mg | ORAL_TABLET | Freq: Once | ORAL | Status: AC
  Administered 2023-02-21: 500 mg via ORAL
  Filled 2023-02-21: qty 1

## 2023-02-21 MED ORDER — HYDROMORPHONE HCL 1 MG/ML IJ SOLN
0.5000 mg | Freq: Once | INTRAMUSCULAR | Status: AC
  Administered 2023-02-21: 0.5 mg via INTRAVENOUS
  Filled 2023-02-21: qty 1

## 2023-02-21 NOTE — ED Provider Notes (Signed)
[place] EMERGENCY DEPARTMENT AT [facility] Provider Note   CSN: [id number] Arrival date & time: 02/20/23  2226     History  Chief Complaint  Patient presents with   Post-op Problem    Chad Guerra is a 72 y.o. male.  The history is provided by the patient and the spouse.  Patient presents for left knee pain.  Patient underwent left total knee arthroplasty on September 26.  He was discharged during the day on September 27.  Soon after getting home he started having intense and worsening pain in the left knee.  No new falls or trauma.  He has taken oral pain meds at home without relief.  Last dose of hydromorphone was at 9 PM on September 27 This has not alleviated his pain.  There is no numbness or tingling in his feet. Patient is prescribed Eliquis for PAF but is not on it currently due to recent surgery  No fevers or vomiting.  No chest pain or shortness of breath Past Medical History:  Diagnosis Date   Arthritis    Bilateral hydrocele 09/01/2020   Hypertension    Medical history non-contributory    Paroxysmal atrial fibrillation [facility]) 09/01/2020   Mar 26, 2018 Entered By: Benjiman Core L Comment: Holter 2019   Primary open angle glaucoma 09/01/2020   Sleep apnea     Home Medications Prior to Admission medications   Medication Sig Start Date End Date Taking? Authorizing Provider  atenolol (TENORMIN) 50 MG tablet Take 50 mg by mouth daily. 06/06/20  Yes [provider]  cholecalciferol (VITAMIN D) 1000 UNITS tablet Take 1,000 Units by mouth daily.   Yes [provider]  docusate sodium (COLACE) 100 MG capsule Take 1 capsule (100 mg total) by mouth 2 (two) times daily. 02/20/23 03/22/23 Yes Hill, Alain Honey, PA-C  finasteride (PROSCAR) 5 MG tablet Take 5 mg by mouth daily. 06/06/20  Yes [provider]  lisinopril (ZESTRIL) 40 MG tablet Take 40 mg by mouth daily.   Yes [provider]  methocarbamol (ROBAXIN) 500 MG  tablet Take 1 tablet (500 mg total) by mouth 2 (two) times daily. 02/21/23  Yes Zadie Rhine, MD  Multiple Vitamin (MULTIVITAMIN WITH MINERALS) TABS Take 1 tablet by mouth daily.   Yes [provider]  ondansetron (ZOFRAN) 4 MG tablet Take 1 tablet (4 mg total) by mouth every 8 (eight) hours as needed for nausea or vomiting. 02/20/23 02/20/24 Yes Hill, Alain Honey, PA-C  polyethylene glycol (MIRALAX) 17 g packet Take 17 g by mouth daily as needed for mild constipation or moderate constipation. 02/20/23 03/22/23 Yes Hill, Alain Honey, PA-C  tamsulosin (FLOMAX) 0.4 MG CAPS capsule Take 0.4 mg by mouth 2 (two) times daily. 02/28/19  Yes [provider]  acetaminophen (TYLENOL) 500 MG tablet Take 2 tablets (1,000 mg total) by mouth every 8 (eight) hours as needed for mild pain, moderate pain, fever or headache. 02/20/23   Hill, Alain Honey, PA-C  apixaban (ELIQUIS) 5 MG TABS tablet Take 5 mg by mouth 2 (two) times daily. 01/23/20   [provider]  HYDROmorphone (DILAUDID) 2 MG tablet Take 0.5-1 tablets (1-2 mg total) by mouth every 4 (four) hours as needed for up to 7 days for severe pain or moderate pain. 02/20/23 02/27/23  Clois Dupes, PA-C      Allergies    Contrast media [iodinated contrast media] and Percocet [oxycodone-acetaminophen]    Review of Systems   Review of Systems  Constitutional:  Negative for fever.  Respiratory:  Negative for shortness of breath.   Cardiovascular:  Negative for chest pain.  Gastrointestinal:  Negative for vomiting.  Musculoskeletal:  Positive for arthralgias and joint swelling.    Physical Exam Updated Vital Signs BP 125/70 (BP Location: Left Arm)   Pulse 61   Temp 98.8 F (37.1 C) (Oral)   Resp 12   SpO2 98%  Physical Exam CONSTITUTIONAL: Well developed/well nourished, uncomfortable appearing HEAD: Normocephalic/atraumatic CV: S1/S2 noted, no murmurs/rubs/gallops noted LUNGS: Lungs are clear to auscultation bilaterally, no apparent  distress ABDOMEN: soft NEURO: Pt is awake/alert/appropriate, moves all extremitiesx4.  No facial droop.  Patient is able to wiggle his toes in his left foot without difficulty.  Denies any sensory deficit to the left foot EXTREMITIES: pulses normal/equal, full ROM Left foot is warm to touch.  Distal pulses intact. Postoperative swelling and mild bruising is noted left knee with bandage in place.  See photo below SKIN: warm, see photo   ED Results / Procedures / Treatments   Labs (all labs ordered are listed, but only abnormal results are displayed) Labs Reviewed - No data to display  EKG None  Radiology DG Knee Left Port  Result Date: 02/19/2023 CLINICAL DATA:  Status post left knee replacement. EXAM: PORTABLE LEFT KNEE - 1-2 VIEW COMPARISON:  None Available. FINDINGS: Left knee arthroplasty in expected alignment. No periprosthetic lucency or fracture. Recent postsurgical change includes air and edema in the soft tissues and joint space. Anterior skin staples in place. IMPRESSION: Left knee arthroplasty without immediate postoperative complication. Electronically Signed   By: Narda Rutherford M.D.   On: 02/19/2023 15:53    Procedures Procedures    Medications Ordered in ED Medications  methocarbamol (ROBAXIN) tablet 500 mg (has no administration in time range)  HYDROmorphone (DILAUDID) injection 1 mg (1 mg Intravenous Given 02/21/23 0009)  ketorolac (TORADOL) 15 MG/ML injection 15 mg (15 mg Intravenous Given 02/21/23 0010)  HYDROmorphone (DILAUDID) injection 0.5 mg (0.5 mg Intravenous Given 02/21/23 0048)    ED Course/ Medical Decision Making/ A&P Clinical Course as of 02/21/23 0235  Sat Feb 21, 2023  0042 Patient with some improvement in pain but still having burning type pain.  His exam is unchanged [DW]  0231 Patient feels much improved.  He is responded well to Dilaudid and anti-inflammatories.  Elevation and ice have also improved his symptoms.  After long discussion with  patient and his wife, he would prefer to go home.  He is already had Dilaudid and Tylenol ordered.  Will add on Robaxin as needed for muscle spasms.  He has had good relief with this in the past.  At this time I suspicion for acute infectious etiology is low.  There is no signs of any obvious vascular injury as he has good pulses without any neurologic deficit. He has follow-up with physical therapy in about 48 hours. [DW]  9147 I discussed at length appropriate use of the pain medications. [DW]    Clinical Course User Index [DW] Zadie Rhine, MD                                 Medical Decision Making Risk Prescription drug management.   This patient presents to the ED for concern of postoperative knee pain, this involves an extensive number of treatment options, and is a complaint that carries with it a high risk of complications and morbidity.  The differential diagnosis includes but is not limited to DVT, septic joint, effusion, hardware fracture, hemarthrosis, chill or injury, wound infection  Comorbidities that complicate the patient evaluation: Patient's presentation is complicated by their history of paroxysmal atrial fibrillation  Additional history obtained: Additional history obtained from spouse Records reviewed previous admission documents   Medicines ordered and prescription drug management: I ordered medication including Dilaudid and Toradol for pain Reevaluation of the patient after these medicines showed that the patient    improved  Reevaluation: After the interventions noted above, I reevaluated the patient and found that they have :improved  Complexity of problems addressed: Patient's presentation is most consistent with  acute presentation with potential threat to life or bodily function  Disposition: After consideration of the diagnostic results and the patient's response to treatment,  I feel that the patent would benefit from discharge   .            Final Clinical Impression(s) / ED Diagnoses Final diagnoses:  Post-operative pain    Rx / DC Orders ED Discharge Orders          Ordered    methocarbamol (ROBAXIN) 500 MG tablet  2 times daily        02/21/23 6962              Zadie Rhine, MD 02/21/23 [phone]

## 2023-02-21 NOTE — Discharge Instructions (Addendum)
You can take the hydromorphone (dilaudid)1mg  tablet every 4 hours as needed for pain  2. You can take the Robaxin once in the morning and once at night as needed for muscle spasms.  Do not take it the same time as the hydromorphone.  This medicine will cause drowsiness  3.You can take Tylenol (acetaminophen/APAP) 1000 mg every 8 hours as needed for pain  4. Be sure to keep the leg elevated and ice it frequently as we discussed

## 2023-02-21 NOTE — ED Provider Notes (Incomplete)
[place] EMERGENCY DEPARTMENT AT [facility] Provider Note   CSN: [id number] Arrival date & time: 02/20/23  2226     History {Add pertinent medical, surgical, social history, OB history to HPI:1} Chief Complaint  Patient presents with  . Post-op Problem    Chad Guerra is a 72 y.o. male.  The history is provided by the patient and the spouse.  Patient presents for left knee pain.  Patient underwent left total knee arthroplasty on September 26.  He was discharged during the day on September 27.  Soon after getting home he started having intense and worsening pain in the left knee.  No new falls or trauma.  He has taken oral pain meds at home without relief.  Last dose of hydromorphone was at 9 PM on September 27 This has not alleviated his pain.  There is no numbness or tingling in his feet. Patient is prescribed Eliquis for PAF but is not on it currently due to recent surgery  No fevers or vomiting.  No chest pain or shortness of breath Past Medical History:  Diagnosis Date  . Arthritis   . Bilateral hydrocele 09/01/2020  . Hypertension   . Medical history non-contributory   . Paroxysmal atrial fibrillation [facility]) 09/01/2020   Mar 26, 2018 Entered By: Benjiman Core L Comment: Holter 2019  . Primary open angle glaucoma 09/01/2020  . Sleep apnea     Home Medications Prior to Admission medications   Medication Sig Start Date End Date Taking? Authorizing Provider  acetaminophen (TYLENOL) 500 MG tablet Take 2 tablets (1,000 mg total) by mouth every 8 (eight) hours as needed for mild pain, moderate pain, fever or headache. 02/20/23   Hill, Alain Honey, PA-C  apixaban (ELIQUIS) 5 MG TABS tablet Take 5 mg by mouth 2 (two) times daily. 01/23/20   [provider]  atenolol (TENORMIN) 50 MG tablet Take 50 mg by mouth daily. 06/06/20   [provider]  cholecalciferol (VITAMIN D) 1000 UNITS tablet Take 1,000 Units by mouth daily.    [provider]  docusate sodium (COLACE) 100 MG capsule Take 1 capsule (100 mg total) by mouth 2 (two) times daily. 02/20/23 03/22/23  Clois Dupes, PA-C  finasteride (PROSCAR) 5 MG tablet Take 5 mg by mouth daily. 06/06/20   [provider]  HYDROmorphone (DILAUDID) 2 MG tablet Take 0.5-1 tablets (1-2 mg total) by mouth every 4 (four) hours as needed for up to 7 days for severe pain or moderate pain. 02/20/23 02/27/23  Clois Dupes, PA-C  lisinopril (ZESTRIL) 40 MG tablet Take 40 mg by mouth daily.    [provider]  methocarbamol (ROBAXIN) 500 MG tablet Take 500 mg by mouth every 6 (six) hours as needed for muscle spasms. 03/29/21   [provider]  Multiple Vitamin (MULTIVITAMIN WITH MINERALS) TABS Take 1 tablet by mouth daily.    [provider]  ondansetron (ZOFRAN) 4 MG tablet Take 1 tablet (4 mg total) by mouth every 8 (eight) hours as needed for nausea or vomiting. 02/20/23 02/20/24  Clois Dupes, PA-C  polyethylene glycol (MIRALAX) 17 g packet Take 17 g by mouth daily as needed for mild constipation or moderate constipation. 02/20/23 03/22/23  Clois Dupes, PA-C  tamsulosin (FLOMAX) 0.4 MG CAPS capsule Take 0.4 mg by mouth 2 (two) times daily. 02/28/19   [provider]      Allergies    Contrast media [iodinated contrast media] and Percocet [oxycodone-acetaminophen]  Review of Systems   Review of Systems  Constitutional:  Negative for fever.  Respiratory:  Negative for shortness of breath.   Cardiovascular:  Negative for chest pain.  Gastrointestinal:  Negative for vomiting.  Musculoskeletal:  Positive for arthralgias and joint swelling.    Physical Exam Updated Vital Signs BP (!) 153/91   Pulse 64   Temp 98.5 F (36.9 C)   Resp 15   SpO2 98%  Physical Exam CONSTITUTIONAL: Well developed/well nourished, uncomfortable appearing HEAD: Normocephalic/atraumatic CV: S1/S2 noted, no murmurs/rubs/gallops noted LUNGS: Lungs are clear to  auscultation bilaterally, no apparent distress ABDOMEN: soft NEURO: Pt is awake/alert/appropriate, moves all extremitiesx4.  No facial droop.  Patient is able to wiggle his toes in his left foot without difficulty.  Denies any sensory deficit to the left foot EXTREMITIES: pulses normal/equal, full ROM Left foot is warm to touch.  Distal pulses intact. Postoperative swelling is noted left knee with bandage in place.  See photo below SKIN: warm, see photo   ED Results / Procedures / Treatments   Labs (all labs ordered are listed, but only abnormal results are displayed) Labs Reviewed - No data to display  EKG None  Radiology DG Knee Left Port  Result Date: 02/19/2023 CLINICAL DATA:  Status post left knee replacement. EXAM: PORTABLE LEFT KNEE - 1-2 VIEW COMPARISON:  None Available. FINDINGS: Left knee arthroplasty in expected alignment. No periprosthetic lucency or fracture. Recent postsurgical change includes air and edema in the soft tissues and joint space. Anterior skin staples in place. IMPRESSION: Left knee arthroplasty without immediate postoperative complication. Electronically Signed   By: Narda Rutherford M.D.   On: 02/19/2023 15:53    Procedures Procedures  {Document cardiac monitor, telemetry assessment procedure when appropriate:1}  Medications Ordered in ED Medications  HYDROmorphone (DILAUDID) injection 1 mg (has no administration in time range)  ketorolac (TORADOL) 15 MG/ML injection 15 mg (has no administration in time range)    ED Course/ Medical Decision Making/ A&P   {   Click here for ABCD2, HEART and other calculatorsREFRESH Note before signing :1}                              Medical Decision Making Risk Prescription drug management.   ***  {Document critical care time when appropriate:1} {Document review of labs and clinical decision tools ie heart score, Chads2Vasc2 etc:1}  {Document your independent review of radiology images, and any outside  records:1} {Document your discussion with family members, caretakers, and with consultants:1} {Document social determinants of health affecting pt's care:1} {Document your decision making why or why not admission, treatments were needed:1} Final Clinical Impression(s) / ED Diagnoses Final diagnoses:  None    Rx / DC Orders ED Discharge Orders     None

## 2023-02-25 ENCOUNTER — Other Ambulatory Visit (HOSPITAL_BASED_OUTPATIENT_CLINIC_OR_DEPARTMENT_OTHER): Payer: Self-pay | Admitting: Medical

## 2023-02-25 ENCOUNTER — Other Ambulatory Visit (HOSPITAL_BASED_OUTPATIENT_CLINIC_OR_DEPARTMENT_OTHER): Payer: No Typology Code available for payment source

## 2023-02-25 ENCOUNTER — Encounter (HOSPITAL_BASED_OUTPATIENT_CLINIC_OR_DEPARTMENT_OTHER): Payer: No Typology Code available for payment source

## 2023-02-25 DIAGNOSIS — M25562 Pain in left knee: Secondary | ICD-10-CM

## 2023-02-25 DIAGNOSIS — M79605 Pain in left leg: Secondary | ICD-10-CM | POA: Diagnosis not present

## 2023-08-01 ENCOUNTER — Encounter (HOSPITAL_COMMUNITY): Payer: Self-pay

## 2023-08-01 ENCOUNTER — Ambulatory Visit (HOSPITAL_COMMUNITY): Admission: EM | Admit: 2023-08-01 | Discharge: 2023-08-01 | Disposition: A | Discharge status: Home / Self Care

## 2023-08-01 DIAGNOSIS — R1031 Right lower quadrant pain: Secondary | ICD-10-CM

## 2023-08-01 NOTE — ED Provider Notes (Signed)
 [facility]    CSN: [id number] Arrival date & time: 08/01/23  1212      History   Chief Complaint Chief Complaint  Patient presents with   Abdominal Pain   Diarrhea    HPI Chad Guerra is a 73 y.o. male.   Patient presents with abdominal pain and diarrhea that started yesterday.  He reports pain started along his navel and has moved to the right lower quadrant.  He denies fever, chills, nausea, vomiting flank pain, dysuria, hematuria.  He takes Tylenol which has provided no relief.  Reports history of cholecystectomy.    Past Medical History:  Diagnosis Date   Arthritis    Bilateral hydrocele 09/01/2020   Hypertension    Medical history non-contributory    Paroxysmal atrial fibrillation (HCC) 09/01/2020   Mar 26, 2018 Entered By: Benjiman Core L Comment: Holter 2019   Primary open angle glaucoma 09/01/2020   Sleep apnea     Patient Active Problem List   Diagnosis Date Noted   Degenerative joint disease of left knee 02/19/2023   S/P total knee arthroplasty, left 02/19/2023   Acute diverticulitis 09/02/2020   Adenomatous polyp of colon 09/01/2020   Adenoma of left adrenal gland 09/01/2020   Degeneration of intervertebral disc 09/01/2020   BPH (benign prostatic hyperplasia) 09/01/2020   Paroxysmal atrial fibrillation (HCC) 09/01/2020   Peripheral edema 09/01/2020   Phlebitis and thrombophlebitis 09/01/2020   Primary open angle glaucoma 09/01/2020   Supraventricular tachycardia (HCC) 09/01/2020   Umbilical hernia 09/01/2020   Ventricular tachycardia (HCC) 09/01/2020   Vitamin D deficiency 09/01/2020   History of colonic diverticulitis 09/01/2020   Chronic anticoagulation 09/01/2020   Recurrent periumbilical incisional hernia s/p repair x2  09/01/2020   Bilateral inguinal hernia (BIH) 09/01/2020   Obesity (BMI 30-39.9) 09/01/2020   Hypertensive urgency 09/01/2020   Diverticulitis of large intestine with perforation without abscess or  bleeding 09/01/2020   Bilateral hydrocele 09/01/2020   Posterior equinus, acquired, left 12/29/2017   Posterior tibial tendon dysfunction 12/29/2017   Tenosynovitis of left ankle 05/23/2017   Tightness of heel cord, left 05/23/2017   Subconjunctival hemorrhage of right eye 05/31/2015   Hypertensive retinopathy of both eyes 02/13/2015   Retinal edema 02/13/2015   Branch retinal vein occlusion of both eyes 10/18/2013   Neutropenia - chronic 04/06/2013   History of cholecystectomy 07/04/2012    Past Surgical History:  Procedure Laterality Date   ANTERIOR CERVICAL DECOMP/DISCECTOMY FUSION N/A 08/25/2012   Procedure: ANTERIOR CERVICAL DECOMPRESSION/DISCECTOMY FUSION 1 LEVEL (ACDF C5-6);  Surgeon: Venita Lick, MD;  Location: [facility] OR;  Service: Orthopedics;  Laterality: N/A;   ANTERIOR FUSION CERVICAL SPINE  08/25/2012   Dr Shon Baton   CHOLECYSTECTOMY N/A 07/04/2012   Procedure: LAPAROSCOPIC CHOLECYSTECTOMY WITH INTRAOPERATIVE CHOLANGIOGRAM;  Surgeon: Valarie Merino, MD;  Location: [facility] ORS;  Service: General;  Laterality: N/A;   HERNIA REPAIR     KNEE ARTHROPLASTY Left 02/19/2023   Procedure: COMPUTER ASSISTED TOTAL KNEE ARTHROPLASTY;  Surgeon: Samson Frederic, MD;  Location: [facility] ORS;  Service: Orthopedics;  Laterality: Left;  160   KNEE ARTHROSCOPY W/ ACL RECONSTRUCTION     LEFT   left ankle and foot srugery      ROTATOR CUFF REPAIR         Home Medications    Prior to Admission medications   Medication Sig Start Date End Date Taking? Authorizing Provider  acetaminophen (TYLENOL) 500 MG tablet Take 2 tablets (1,000 mg total) by mouth every 8 (eight) hours  as needed for mild pain, moderate pain, fever or headache. 02/20/23   Clois Dupes, PA-C  apixaban (ELIQUIS) 5 MG TABS tablet Take 5 mg by mouth 2 (two) times daily. 01/23/20   [provider]  atenolol (TENORMIN) 50 MG tablet Take 50 mg by mouth daily. 06/06/20   [provider]  cholecalciferol (VITAMIN D) 1000 UNITS  tablet Take 1,000 Units by mouth daily.    [provider]  finasteride (PROSCAR) 5 MG tablet Take 5 mg by mouth daily. 06/06/20   [provider]  lisinopril (ZESTRIL) 40 MG tablet Take 40 mg by mouth daily.    [provider]  methocarbamol (ROBAXIN) 500 MG tablet Take 1 tablet (500 mg total) by mouth 2 (two) times daily. 02/21/23   Zadie Rhine, MD  Multiple Vitamin (MULTIVITAMIN WITH MINERALS) TABS Take 1 tablet by mouth daily.    [provider]  ondansetron (ZOFRAN) 4 MG tablet Take 1 tablet (4 mg total) by mouth every 8 (eight) hours as needed for nausea or vomiting. 02/20/23 02/20/24  Clois Dupes, PA-C  tamsulosin (FLOMAX) 0.4 MG CAPS capsule Take 0.4 mg by mouth 2 (two) times daily. 02/28/19   [provider]    Family History Family History  Problem Relation Age of Onset   Heart disease Neg Hx     Social History Social History   Tobacco Use   Smoking status: Never   Smokeless tobacco: Never  Vaping Use   Vaping status: Never Used  Substance Use Topics   Alcohol use: No   Drug use: No     Allergies   Contrast media [iodinated contrast media] and Percocet [oxycodone-acetaminophen]   Review of Systems Review of Systems  Constitutional:  Negative for chills and fever.  HENT:  Negative for ear pain and sore throat.   Eyes:  Negative for pain and visual disturbance.  Respiratory:  Negative for cough and shortness of breath.   Cardiovascular:  Negative for chest pain and palpitations.  Gastrointestinal:  Positive for abdominal pain and diarrhea. Negative for vomiting.  Genitourinary:  Negative for dysuria and hematuria.  Musculoskeletal:  Negative for arthralgias and back pain.  Skin:  Negative for color change and rash.  Neurological:  Negative for seizures and syncope.  All other systems reviewed and are negative.    Physical Exam Triage Vital Signs ED Triage Vitals  Encounter Vitals Group     BP 08/01/23 1301  138/84     Systolic BP Percentile --      Diastolic BP Percentile --      Pulse Rate 08/01/23 1301 68     Resp 08/01/23 1301 16     Temp 08/01/23 1301 98.3 F (36.8 C)     Temp Source 08/01/23 1301 Oral     SpO2 08/01/23 1301 94 %     Weight --      Height --      Head Circumference --      Peak Flow --      Pain Score 08/01/23 1300 9     Pain Loc --      Pain Education --      Exclude from Growth Chart --    No data found.  Updated Vital Signs BP 138/84 (BP Location: Left Arm)   Pulse 68   Temp 98.3 F (36.8 C) (Oral)   Resp 16   SpO2 94%   Visual Acuity Right Eye Distance:   Left Eye Distance:  Bilateral Distance:    Right Eye Near:   Left Eye Near:    Bilateral Near:     Physical Exam Vitals and nursing note reviewed.  Constitutional:      General: He is not in acute distress.    Appearance: He is well-developed.  HENT:     Head: Normocephalic and atraumatic.  Eyes:     Conjunctiva/sclera: Conjunctivae normal.  Cardiovascular:     Rate and Rhythm: Normal rate and regular rhythm.     Heart sounds: No murmur heard. Pulmonary:     Effort: Pulmonary effort is normal. No respiratory distress.     Breath sounds: Normal breath sounds.  Abdominal:     Palpations: Abdomen is soft.     Tenderness: There is no abdominal tenderness.     Comments: Right lower quadrant pain with tenderness to palpation.  Musculoskeletal:        General: No swelling.     Cervical back: Neck supple.  Skin:    General: Skin is warm and dry.     Capillary Refill: Capillary refill takes less than 2 seconds.  Neurological:     Mental Status: He is alert.  Psychiatric:        Mood and Affect: Mood normal.      UC Treatments / Results  Labs (all labs ordered are listed, but only abnormal results are displayed) Labs Reviewed - No data to display  EKG   Radiology No results found.  Procedures Procedures (including critical care time)  Medications Ordered in  UC Medications - No data to display  Initial Impression / Assessment and Plan / UC Course  I have reviewed the triage vital signs and the nursing notes.  Pertinent labs & imaging results that were available during my care of the patient were reviewed by me and considered in my medical decision making (see chart for details).     Abdominal pain that started at the navel and moved to the right lower quadrant, TTP on exam.  Associated diarrhea.  Recommend ED evaluation to rule out appendicitis. Final Clinical Impressions(s) / UC Diagnoses   Final diagnoses:  RLQ abdominal pain     Discharge Instructions      Recommend further evaluation in the Emergency Department     ED Prescriptions   None    PDMP not reviewed this encounter.   Ward, Tylene Fantasia, PA-C 08/01/23 1350

## 2023-08-01 NOTE — ED Triage Notes (Signed)
 Patient reports that he has been having abdominal pain below his navel since yesterday. Patient also reports "little bit of diarrhea."  Patient states he has not had any medications for his symptoms.

## 2023-08-01 NOTE — Discharge Instructions (Signed)
Recommend further evaluation in the Emergency Department

## 2023-08-01 NOTE — ED Notes (Signed)
 Patient is being discharged from the Urgent Care and sent to the Emergency Department via POV . Per Christeen Douglas, PA, patient is in need of higher level of care due to abdominal pain. Patient is aware and verbalizes understanding of plan of care.  Vitals:   08/01/23 1301  BP: 138/84  Pulse: 68  Resp: 16  Temp: 98.3 F (36.8 C)  SpO2: 94%

## 2024-05-20 ENCOUNTER — Other Ambulatory Visit: Payer: Self-pay

## 2024-05-20 ENCOUNTER — Other Ambulatory Visit (HOSPITAL_COMMUNITY): Payer: Self-pay

## 2024-05-20 ENCOUNTER — Emergency Department (HOSPITAL_COMMUNITY)

## 2024-05-20 ENCOUNTER — Emergency Department (HOSPITAL_COMMUNITY)
Admission: EM | Admit: 2024-05-20 | Discharge: 2024-05-20 | Disposition: A | Discharge status: Home / Self Care | Attending: Emergency Medicine | Admitting: Emergency Medicine

## 2024-05-20 ENCOUNTER — Encounter (HOSPITAL_COMMUNITY): Payer: Self-pay

## 2024-05-20 DIAGNOSIS — M545 Low back pain, unspecified: Secondary | ICD-10-CM

## 2024-05-20 DIAGNOSIS — I482 Chronic atrial fibrillation, unspecified: Secondary | ICD-10-CM | POA: Diagnosis not present

## 2024-05-20 DIAGNOSIS — X58XXXA Exposure to other specified factors, initial encounter: Secondary | ICD-10-CM | POA: Diagnosis not present

## 2024-05-20 DIAGNOSIS — I10 Essential (primary) hypertension: Secondary | ICD-10-CM | POA: Diagnosis not present

## 2024-05-20 DIAGNOSIS — Z79899 Other long term (current) drug therapy: Secondary | ICD-10-CM | POA: Diagnosis not present

## 2024-05-20 DIAGNOSIS — S39012A Strain of muscle, fascia and tendon of lower back, initial encounter: Secondary | ICD-10-CM | POA: Diagnosis not present

## 2024-05-20 DIAGNOSIS — Z7901 Long term (current) use of anticoagulants: Secondary | ICD-10-CM | POA: Diagnosis not present

## 2024-05-20 LAB — CBC
HCT: 45.2 % (ref 39.0–52.0)
Hemoglobin: 15.1 g/dL (ref 13.0–17.0)
MCH: 31.5 pg (ref 26.0–34.0)
MCHC: 33.4 g/dL (ref 30.0–36.0)
MCV: 94.4 fL (ref 80.0–100.0)
Platelets: 179 K/uL (ref 150–400)
RBC: 4.79 MIL/uL (ref 4.22–5.81)
RDW: 11.9 % (ref 11.5–15.5)
WBC: 3.4 K/uL — ABNORMAL LOW (ref 4.0–10.5)
nRBC: 0 % (ref 0.0–0.2)

## 2024-05-20 LAB — COMPREHENSIVE METABOLIC PANEL WITH GFR
ALT: 21 U/L (ref 0–44)
AST: 25 U/L (ref 15–41)
Albumin: 4 g/dL (ref 3.5–5.0)
Alkaline Phosphatase: 112 U/L (ref 38–126)
Anion gap: 10 (ref 5–15)
BUN: 11 mg/dL (ref 8–23)
CO2: 25 mmol/L (ref 22–32)
Calcium: 8.7 mg/dL — ABNORMAL LOW (ref 8.9–10.3)
Chloride: 108 mmol/L (ref 98–111)
Creatinine, Ser: 1.01 mg/dL (ref 0.61–1.24)
GFR, Estimated: >60 mL/min
Glucose, Bld: 99 mg/dL (ref 70–99)
Potassium: 3.9 mmol/L (ref 3.5–5.1)
Sodium: 142 mmol/L (ref 135–145)
Total Bilirubin: 1 mg/dL (ref 0.0–1.2)
Total Protein: 7.1 g/dL (ref 6.5–8.1)

## 2024-05-20 LAB — URINALYSIS, ROUTINE W REFLEX MICROSCOPIC
Bacteria, UA: NONE SEEN
Bilirubin Urine: NEGATIVE
Glucose, UA: NEGATIVE mg/dL
Hgb urine dipstick: NEGATIVE
Ketones, ur: NEGATIVE mg/dL
Leukocytes,Ua: NEGATIVE
Nitrite: NEGATIVE
Protein, ur: NEGATIVE mg/dL
Specific Gravity, Urine: 1.014 (ref 1.005–1.030)
pH: 5 (ref 5.0–8.0)

## 2024-05-20 LAB — LIPASE, BLOOD: Lipase: 44 U/L (ref 11–51)

## 2024-05-20 MED ORDER — LORAZEPAM 1 MG PO TABS
1.0000 mg | ORAL_TABLET | Freq: Once | ORAL | Status: AC
  Administered 2024-05-20: 1 mg via ORAL
  Filled 2024-05-20: qty 1

## 2024-05-20 MED ORDER — LIDOCAINE 5 % EX PTCH
1.0000 | MEDICATED_PATCH | CUTANEOUS | Status: DC
  Administered 2024-05-20: 1 via TRANSDERMAL
  Filled 2024-05-20: qty 1

## 2024-05-20 MED ORDER — MORPHINE SULFATE (PF) 4 MG/ML IV SOLN
4.0000 mg | Freq: Once | INTRAVENOUS | Status: AC
  Administered 2024-05-20: 4 mg via INTRAVENOUS
  Filled 2024-05-20: qty 1

## 2024-05-20 MED ORDER — ACETAMINOPHEN 500 MG PO TABS
1000.0000 mg | ORAL_TABLET | Freq: Four times a day (QID) | ORAL | 0 refills | Status: AC | PRN
  Filled 2024-05-20: qty 30, 4d supply, fill #0

## 2024-05-20 MED ORDER — ACETAMINOPHEN 500 MG PO TABS
1000.0000 mg | ORAL_TABLET | Freq: Once | ORAL | Status: AC
  Administered 2024-05-20: 1000 mg via ORAL
  Filled 2024-05-20: qty 2

## 2024-05-20 MED ORDER — LIDOCAINE 5 % EX PTCH
2.0000 | MEDICATED_PATCH | CUTANEOUS | Status: DC
  Administered 2024-05-20: 2 via TRANSDERMAL
  Filled 2024-05-20: qty 2

## 2024-05-20 MED ORDER — METHOCARBAMOL 500 MG PO TABS
500.0000 mg | ORAL_TABLET | Freq: Two times a day (BID) | ORAL | 0 refills | Status: AC
  Filled 2024-05-20: qty 20, 10d supply, fill #0

## 2024-05-20 MED ORDER — LIDOCAINE 5 % EX PTCH
1.0000 | MEDICATED_PATCH | CUTANEOUS | 0 refills | Status: AC
  Filled 2024-05-20: qty 10, 10d supply, fill #0

## 2024-05-20 NOTE — ED Triage Notes (Addendum)
 Lower back pain in left side that started 2 days ago after waking up. Pain radiates down left leg.

## 2024-05-20 NOTE — Discharge Instructions (Signed)
Your examination today is most concerning for a muscular injury 1. Medications: alternate ibuprofen and tylenol for pain control, take all usual home medications as they are prescribed 2. Treatment: rest, ice, elevate and use an ACE wrap or other compressive therapy to decrease swelling. Also drink plenty of fluids and do plenty of gentle stretching and move the affected muscle through its normal range of motion to prevent stiffness. 3. Follow Up: If your symptoms do not improve please follow up with orthopedics/sports medicine or your PCP for discussion of your diagnoses and further evaluation after today's visit; if you do not have a primary care doctor use the resource guide provided to find one; Please return to the ER for worsening symptoms or other concerns.   

## 2024-05-20 NOTE — ED Provider Notes (Signed)
 " [place] EMERGENCY DE[facility]MENT AT [facility] Provider Note   CSN: [id number] Arrival date & time: 05/20/24  1021     Patient presents with: Back Pain   Chad Guerra is a 73 y.o. male.    Back Pain  Patient is a 73 year old male with a past medical history significant for hypertension, paroxysmal A-fib on Eliquis , arthritis, sleep apnea  Patient also has bilateral hydroceles  Patient presents emergency room today with complaints of left-sided low back pain.  This started 2 days ago after he caught himself and had sudden onset of left-sided/low back pain.  Denies any frequency urgency dysuria hematuria.  No chest pain difficulty breathing.  No other associate symptoms.  States that he does have some pain that radiates down his leg but not past his knee.      Prior to Admission medications  Medication Sig Start Date End Date Taking? Authorizing Provider  acetaminophen  (TYLENOL ) 500 MG tablet Take 2 tablets (1,000 mg total) by mouth every 6 (six) hours as needed. 05/20/24  Yes Nestor Wieneke S, PA  lidocaine  (LIDODERM ) 5 % Place 1 patch onto the skin daily. Remove & Discard patch within 12 hours or as directed by MD 05/20/24  Yes Dajah Fischman, Hamp RAMAN, PA  methocarbamol  (ROBAXIN ) 500 MG tablet Take 1 tablet (500 mg total) by mouth 2 (two) times daily. 05/20/24  Yes [place], Hamp S, PA  apixaban  (ELIQUIS ) 5 MG TABS tablet Take 5 mg by mouth 2 (two) times daily. 01/23/20   [provider]  atenolol  (TENORMIN ) 50 MG tablet Take 50 mg by mouth daily. 06/06/20   [provider]  cholecalciferol (VITAMIN D) 1000 UNITS tablet Take 1,000 Units by mouth daily.    [provider]  finasteride  (PROSCAR ) 5 MG tablet Take 5 mg by mouth daily. 06/06/20   [provider]  lisinopril  (ZESTRIL ) 40 MG tablet Take 40 mg by mouth daily.    [provider]  Multiple Vitamin (MULTIVITAMIN WITH MINERALS) TABS Take 1 tablet by mouth daily.     [provider]  tamsulosin  (FLOMAX ) 0.4 MG CAPS capsule Take 0.4 mg by mouth 2 (two) times daily. 02/28/19   [provider]    Allergies: Contrast media [iodinated contrast media] and Percocet [oxycodone -acetaminophen ]    Review of Systems  Musculoskeletal:  Positive for back pain.    Updated Vital Signs BP (!) 153/87 (BP Location: Left Arm)   Pulse 69   Temp 97.8 F (36.6 C) (Oral)   Resp 18   Ht 5' 10 (1.778 m)   Wt 103 kg   SpO2 100%   BMI 32.57 kg/m   Physical Exam Vitals and nursing note reviewed.  Constitutional:      General: He is not in acute distress. HENT:     Head: Normocephalic and atraumatic.     Nose: Nose normal.  Eyes:     General: No scleral icterus. Cardiovascular:     Rate and Rhythm: Normal rate and regular rhythm.     Pulses: Normal pulses.     Heart sounds: Normal heart sounds.  Pulmonary:     Effort: Pulmonary effort is normal. No respiratory distress.     Breath sounds: No wheezing.  Abdominal:     Palpations: Abdomen is soft.     Tenderness: There is no abdominal tenderness.  Musculoskeletal:     Cervical back: Normal range of motion.     Right lower leg: No edema.     Left  lower leg: No edema.     Comments: Chronic skin defect on her left lower extremity consistent with previous surgeries  Some left-sided paravertebral muscular tenderness of the lumbar spine.  No midline C, T, L-spine tenderness  Skin:    General: Skin is warm and dry.     Capillary Refill: Capillary refill takes less than 2 seconds.  Neurological:     Mental Status: He is alert. Mental status is at baseline.  Psychiatric:        Mood and Affect: Mood normal.        Behavior: Behavior normal.     (all labs ordered are listed, but only abnormal results are displayed) Labs Reviewed  COMPREHENSIVE METABOLIC PANEL WITH GFR - Abnormal; Notable for the following components:      Result Value   Calcium 8.7 (*)    All other components within normal  limits  CBC - Abnormal; Notable for the following components:   WBC 3.4 (*)    All other components within normal limits  LIPASE, BLOOD  URINALYSIS, ROUTINE W REFLEX MICROSCOPIC    EKG: None  Radiology: CT L-SPINE NO CHARGE Result Date: 05/20/2024 EXAM: CT OF THE LUMBAR SPINE WITHOUT CONTRAST 05/20/2024 12:15:32 PM TECHNIQUE: CT of the lumbar spine was performed without the administration of intravenous contrast. Multiplanar reformatted images are provided for review. Automated exposure control, iterative reconstruction, and/or weight based adjustment of the mA/kV was utilized to reduce the radiation dose to as low as reasonably achievable. COMPARISON: CT abdomen and pelvis 05/20/2024 12:15:32 PM, reported separately. CT abdomen and pelvis 01/26/2023. CLINICAL HISTORY: 73 year old male. Left flank pain and back pain FINDINGS: Normal lumbar segmentation. BONES AND ALIGNMENT: Normal vertebral body heights. Bone mineralization, lumbar vertebral height and alignment are stable from the CT last year. Mild chronic concave appearance of the lumbar vertebral endplates and occasional small Schmorl nodes (right L3 inferior endplate series 8 image 51). No acute fracture or suspicious bone lesion identified. SOFT TISSUES: Lumbar paraspinal soft tissues are within normal limits. Abdomen and pelvic viscera detailed separately today. DEGENERATIVE CHANGES: Normal background lower thoracic and lumbar spinal canal size. Partially calcified chronic circumferential disc bulge with endplate spurring and posterior element hypertrophy in the lumbar spine results in mild to moderate spinal stenosis at both L3-L4 (series 10 image 66) and L4-L5 (series 10 image 79). And there is associated bilateral neural foraminal stenosis at both levels which appears moderate to severe by CT. These findings appear stable from the CT last year. IMPRESSION: 1. No acute osseous abnormality in the lumbar spine. 2. Chronic lumbar spine  degeneration, stable from CT last year. Multifactorial spinal and foraminal stenosis at both L3-L4 and L4-L5. Electronically signed by: Helayne Hurst MD 05/20/2024 01:02 PM EST RP Workstation: [id number]   CT Renal Stone Study Result Date: 05/20/2024 EXAM: CT UROGRAM 05/20/2024 12:15:32 PM TECHNIQUE: CT of the abdomen and pelvis was performed without the administration of intravenous contrast. Multiplanar reformatted images are provided for review. Automated exposure control, iterative reconstruction, and/or weight based adjustment of the mA/kV was utilized to reduce the radiation dose to as low as reasonably achievable. COMPARISON: Lumbar spine CT today reported separately. Previous non-contrast CT abdomen and pelvis 01/26/2023 and earlier. CLINICAL HISTORY: 73 year old male with abdominal/flank pain, stone suspected, and left flank pain. FINDINGS: LOWER CHEST: Stable and negative visible non-contrast lower chest. LIVER: The liver is unremarkable. GALLBLADDER AND BILE DUCTS: Chronic cholecystectomy. No biliary ductal dilatation. SPLEEN: No acute abnormality. PANCREAS: Partial pancreatic atrophy. ADRENAL GLANDS: No  acute abnormality. KIDNEYS, URETERS AND BLADDER: No nephrolithiasis identified. No hydronephrosis. Chronic nonspecific bilateral perinephric fat stranding appears stable since last year. Stable left lower pole simple fluid density renal cyst. Per consensus, no follow-up is needed for simple Bosniak type 1 and 2 renal cysts, unless the patient has a malignancy history or risk factors. Decompressed ureters to the bladder. Diminutive and unremarkable bladder. GI AND BOWEL: Decompressed stomach. Severe diverticulosis of the descending and sigmoid colon. No active inflammation. No dilated bowel. Normal appendix on series 4 image 55. PERITONEUM AND RETROPERITONEUM: A small and circumscribed 2.5 cm simple fluid density collection adjacent to the right inferior epigastric muscles and the right hemipelvis is  unchanged since prior CTs dating back to 01/27/2014 (series 4 image 72 today) and appears benign. 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 No ascites. No free air. VASCULATURE: Mild to moderate aortic and iliac artery tortuosity, calcified atherosclerosis. LYMPH NODES: No lymphadenopathy. REPRODUCTIVE ORGANS: No acute abnormality. BONES AND SOFT TISSUES: No acute osseous abnormality. Chronic ventral rectus muscle diastasis, no overt ventral abdominal hernia. IMPRESSION: 1. No nephrolithiasis or obstructive uropathy. No acute or inflammatory process identified in the non-contrast abdomen and pelvis. 2. Lumbar spine CT today reported separately. Electronically signed by: Helayne Hurst MD 05/20/2024 12:57 PM EST RP Workstation: [id number]     Procedures   Medications Ordered in the ED  lidocaine  (LIDODERM ) 5 % 2 patch (2 patches Transdermal Patch Applied 05/20/24 1219)  lidocaine  (LIDODERM ) 5 % 1 patch (1 patch Transdermal Patch Applied 05/20/24 1240)  LORazepam  (ATIVAN ) tablet 1 mg (1 mg Oral Given 05/20/24 1214)  acetaminophen  (TYLENOL ) tablet 1,000 mg (1,000 mg Oral Given 05/20/24 1214)  morphine  (PF) 4 MG/ML injection 4 mg (4 mg Intravenous Given 05/20/24 1228)                                    Medical Decision Making Amount and/or Complexity of Data Reviewed Labs: ordered. Radiology: ordered.  Risk OTC drugs. Prescription drug management.   Patient is a 73 year old male with a past medical history significant for hypertension, paroxysmal A-fib on Eliquis , arthritis, sleep apnea  Patient also has bilateral hydroceles  Patient presents emergency room today with complaints of left-sided low back pain.  This started 2 days ago after he caught himself and had sudden onset of left-sided/low back pain.  Denies any frequency urgency dysuria hematuria.  No chest pain difficulty breathing.  No other associate symptoms.  States that he does have some pain that radiates down his leg but not past his  knee.  Labs unremarkable urinalysis without blood or evidence of infection  CT renal stone study negative and L-spine no charge recon does not show any fractures.  Morphine  Tylenol  Ativan  and lidocaine  patches given.  Patient feels much improved I suspect this is muscular etiology recommended Robaxin  Tylenol  Lidoderm  warm compresses massage time at home.  Return precautions emergency room provided and instructions follow-up with primary care.   Final diagnoses:  Acute left-sided low back pain without sciatica  Strain of lumbar region, initial encounter    ED Discharge Orders          Ordered    methocarbamol  (ROBAXIN ) 500 MG tablet  2 times daily        05/20/24 1336    lidocaine  (LIDODERM ) 5 %  Every 24 hours        05/20/24 1336    acetaminophen  (TYLENOL ) 500 MG  tablet  Every 6 hours PRN        05/20/24 1336               Neldon Hamp RAMAN, [place] 05/25/24 9070    Kingsley, Victoria K, DO 06/02/24 [phone]  "

## 2024-05-25 ENCOUNTER — Other Ambulatory Visit (HOSPITAL_COMMUNITY): Payer: Self-pay
